# Patient Record
Sex: Male | Born: 1935 | Race: White | Hispanic: No | Marital: Married | Smoking: Never smoker
Health system: Southern US, Community
[De-identification: ages and names within clinical notes are randomized; demographics above are authoritative.]

## PROBLEM LIST (undated history)

## (undated) DIAGNOSIS — N320 Bladder-neck obstruction: Secondary | ICD-10-CM

## (undated) DIAGNOSIS — E785 Hyperlipidemia, unspecified: Secondary | ICD-10-CM

## (undated) DIAGNOSIS — M1990 Unspecified osteoarthritis, unspecified site: Secondary | ICD-10-CM

## (undated) DIAGNOSIS — I10 Essential (primary) hypertension: Secondary | ICD-10-CM

## (undated) DIAGNOSIS — J45909 Unspecified asthma, uncomplicated: Secondary | ICD-10-CM

## (undated) DIAGNOSIS — K635 Polyp of colon: Secondary | ICD-10-CM

## (undated) DIAGNOSIS — R7303 Prediabetes: Secondary | ICD-10-CM

## (undated) DIAGNOSIS — B269 Mumps without complication: Secondary | ICD-10-CM

## (undated) DIAGNOSIS — B059 Measles without complication: Secondary | ICD-10-CM

## (undated) HISTORY — DX: Essential (primary) hypertension: I10

## (undated) HISTORY — DX: Polyp of colon: K63.5

## (undated) HISTORY — DX: Hyperlipidemia, unspecified: E78.5

---
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
## 1970-09-07 HISTORY — PX: KNEE SURGERY: SHX244

## 2001-03-18 ENCOUNTER — Encounter: Payer: Self-pay | Admitting: Internal Medicine

## 2001-03-18 ENCOUNTER — Ambulatory Visit (HOSPITAL_COMMUNITY): Admission: RE | Admit: 2001-03-18 | Discharge: 2001-03-18 | Payer: Self-pay | Admitting: Internal Medicine

## 2001-06-17 ENCOUNTER — Encounter: Payer: Self-pay | Admitting: Emergency Medicine

## 2001-06-17 ENCOUNTER — Emergency Department (HOSPITAL_COMMUNITY): Admission: EM | Admit: 2001-06-17 | Discharge: 2001-06-17 | Payer: Self-pay | Admitting: Emergency Medicine

## 2006-10-20 ENCOUNTER — Emergency Department (HOSPITAL_COMMUNITY): Admission: EM | Admit: 2006-10-20 | Discharge: 2006-10-20 | Payer: Self-pay | Admitting: Emergency Medicine

## 2008-10-26 ENCOUNTER — Ambulatory Visit: Payer: Self-pay | Admitting: Gastroenterology

## 2008-11-05 LAB — HM COLONOSCOPY

## 2008-11-09 ENCOUNTER — Ambulatory Visit: Payer: Self-pay | Admitting: Gastroenterology

## 2011-11-09 ENCOUNTER — Ambulatory Visit (HOSPITAL_COMMUNITY)
Admission: RE | Admit: 2011-11-09 | Discharge: 2011-11-09 | Disposition: A | Discharge status: Home / Self Care | Payer: Medicare Other | Source: Ambulatory Visit | Attending: Orthopaedic Surgery | Admitting: Orthopaedic Surgery

## 2011-11-09 DIAGNOSIS — M7989 Other specified soft tissue disorders: Secondary | ICD-10-CM | POA: Insufficient documentation

## 2011-11-09 DIAGNOSIS — R52 Pain, unspecified: Secondary | ICD-10-CM

## 2011-11-09 DIAGNOSIS — R609 Edema, unspecified: Secondary | ICD-10-CM | POA: Insufficient documentation

## 2011-12-14 ENCOUNTER — Other Ambulatory Visit: Payer: Self-pay | Admitting: Internal Medicine

## 2011-12-14 DIAGNOSIS — I714 Abdominal aortic aneurysm, without rupture: Secondary | ICD-10-CM

## 2011-12-22 ENCOUNTER — Ambulatory Visit
Admission: RE | Admit: 2011-12-22 | Discharge: 2011-12-22 | Disposition: A | Discharge status: Home / Self Care | Payer: Medicare Other | Source: Ambulatory Visit | Attending: Internal Medicine | Admitting: Internal Medicine

## 2011-12-22 DIAGNOSIS — I714 Abdominal aortic aneurysm, without rupture: Secondary | ICD-10-CM

## 2012-01-14 HISTORY — PX: OTHER SURGICAL HISTORY: SHX169

## 2013-08-02 ENCOUNTER — Encounter: Payer: Self-pay | Admitting: Internal Medicine

## 2013-08-02 ENCOUNTER — Ambulatory Visit: Payer: Medicare Other | Admitting: Internal Medicine

## 2013-08-02 VITALS — BP 110/70 | HR 86 | Temp 98.6°F | Resp 16 | Wt 184.0 lb

## 2013-08-02 DIAGNOSIS — N320 Bladder-neck obstruction: Secondary | ICD-10-CM

## 2013-08-02 DIAGNOSIS — J029 Acute pharyngitis, unspecified: Secondary | ICD-10-CM

## 2013-08-02 DIAGNOSIS — J0410 Acute tracheitis without obstruction: Secondary | ICD-10-CM

## 2013-08-02 DIAGNOSIS — E559 Vitamin D deficiency, unspecified: Secondary | ICD-10-CM | POA: Insufficient documentation

## 2013-08-02 DIAGNOSIS — J019 Acute sinusitis, unspecified: Secondary | ICD-10-CM

## 2013-08-02 DIAGNOSIS — I10 Essential (primary) hypertension: Secondary | ICD-10-CM | POA: Insufficient documentation

## 2013-08-02 DIAGNOSIS — E782 Mixed hyperlipidemia: Secondary | ICD-10-CM

## 2013-08-02 HISTORY — DX: Bladder-neck obstruction: N32.0

## 2013-08-02 MED ORDER — HYDROCODONE-ACETAMINOPHEN 5-325 MG PO TABS: 1.0000 | ORAL_TABLET | Freq: Four times a day (QID) | ORAL | Status: DC | PRN

## 2013-08-02 MED ORDER — AZITHROMYCIN 250 MG PO TABS: ORAL_TABLET | ORAL | Status: AC

## 2013-08-02 MED ORDER — PREDNISONE 20 MG PO TABS: 20.0000 mg | ORAL_TABLET | ORAL | Status: DC

## 2013-08-02 NOTE — Patient Instructions (Signed)
Strep Throat Strep throat is an infection of the throat caused by a bacteria named Streptococcus pyogenes. Your caregiver may call the infection streptococcal "tonsillitis" or "pharyngitis" depending on whether there are signs of inflammation in the tonsils or back of the throat. Strep throat is most common in children aged 76 15 years during the cold months of the year, but it can occur in people of any age during any season. This infection is spread from person to person (contagious) through coughing, sneezing, or other close contact. SYMPTOMS   Fever or chills.  Painful, swollen, red tonsils or throat.  Pain or difficulty when swallowing.  White or yellow spots on the tonsils or throat.  Swollen, tender lymph nodes or "glands" of the neck or under the jaw.  Red rash all over the body (rare). DIAGNOSIS  Many different infections can cause the same symptoms. A test must be done to confirm the diagnosis so the right treatment can be given. A "rapid strep test" can help your caregiver make the diagnosis in a few minutes. If this test is not available, a light swab of the infected area can be used for a throat culture test. If a throat culture test is done, results are usually available in a day or two. TREATMENT  Strep throat is treated with antibiotic medicine. HOME CARE INSTRUCTIONS   Gargle with 1 tsp of salt in 1 cup of warm water, 3 4 times per day or as needed for comfort.  Family members who also have a sore throat or fever should be tested for strep throat and treated with antibiotics if they have the strep infection.  Make sure everyone in your household washes their hands well.  Do not share food, drinking cups, or personal items that could cause the infection to spread to others.  You may need to eat a soft food diet until your sore throat gets better.  Drink enough water and fluids to keep your urine clear or pale yellow. This will help prevent dehydration.  Get plenty of  rest.  Stay home from school, daycare, or work until you have been on antibiotics for 24 hours.  Only take over-the-counter or prescription medicines for pain, discomfort, or fever as directed by your caregiver.  If antibiotics are prescribed, take them as directed. Finish them even if you start to feel better. SEEK MEDICAL CARE IF:   The glands in your neck continue to enlarge.  You develop a rash, cough, or earache.  You cough up green, yellow-brown, or bloody sputum.  You have pain or discomfort not controlled by medicines.  Your problems seem to be getting worse rather than better. SEEK IMMEDIATE MEDICAL CARE IF:   You develop any new symptoms such as vomiting, severe headache, stiff or painful neck, chest pain, shortness of breath, or trouble swallowing.  You develop severe throat pain, drooling, or changes in your voice.  You develop swelling of the neck, or the skin on the neck becomes red and tender.  You have a fever.  You develop signs of dehydration, such as fatigue, dry mouth, and decreased urination.  You become increasingly sleepy, or you cannot wake up completely. Document Released: 08/21/2000 Document Revised: 08/10/2012 Document Reviewed: 10/23/2010 [facility] Patient Information 2014 [place], [place].   Sinusitis Sinusitis is redness, soreness, and swelling (inflammation) of the paranasal sinuses. Paranasal sinuses are air pockets within the bones of your face (beneath the eyes, the middle of the forehead, or above the eyes). In healthy paranasal sinuses,  mucus is able to drain out, and air is able to circulate through them by way of your nose. However, when your paranasal sinuses are inflamed, mucus and air can become trapped. This can allow bacteria and other germs to grow and cause infection. Sinusitis can develop quickly and last only a short time (acute) or continue over a long period (chronic). Sinusitis that lasts for more than 12 weeks is considered  chronic.  CAUSES  Causes of sinusitis include:  Allergies.  Structural abnormalities, such as displacement of the cartilage that separates your nostrils (deviated septum), which can decrease the air flow through your nose and sinuses and affect sinus drainage.  Functional abnormalities, such as when the small hairs (cilia) that line your sinuses and help remove mucus do not work properly or are not present. SYMPTOMS  Symptoms of acute and chronic sinusitis are the same. The primary symptoms are pain and pressure around the affected sinuses. Other symptoms include:  Upper toothache.  Earache.  Headache.  Bad breath.  Decreased sense of smell and taste.  A cough, which worsens when you are lying flat.  Fatigue.  Fever.  Thick drainage from your nose, which often is green and may contain pus (purulent).  Swelling and warmth over the affected sinuses. DIAGNOSIS  Your caregiver will perform a physical exam. During the exam, your caregiver may:  Look in your nose for signs of abnormal growths in your nostrils (nasal polyps).  Tap over the affected sinus to check for signs of infection.  View the inside of your sinuses (endoscopy) with a special imaging device with a light attached (endoscope), which is inserted into your sinuses. If your caregiver suspects that you have chronic sinusitis, one or more of the following tests may be recommended:  Allergy tests.  Nasal culture A sample of mucus is taken from your nose and sent to a lab and screened for bacteria.  Nasal cytology A sample of mucus is taken from your nose and examined by your caregiver to determine if your sinusitis is related to an allergy. TREATMENT  Most cases of acute sinusitis are related to a viral infection and will resolve on their own within 10 days. Sometimes medicines are prescribed to help relieve symptoms (pain medicine, decongestants, nasal steroid sprays, or saline sprays).  However, for sinusitis  related to a bacterial infection, your caregiver will prescribe antibiotic medicines. These are medicines that will help kill the bacteria causing the infection.  Rarely, sinusitis is caused by a fungal infection. In theses cases, your caregiver will prescribe antifungal medicine. For some cases of chronic sinusitis, surgery is needed. Generally, these are cases in which sinusitis recurs more than 3 times per year, despite other treatments. HOME CARE INSTRUCTIONS   Drink plenty of water. Water helps thin the mucus so your sinuses can drain more easily.  Use a humidifier.  Inhale steam 3 to 4 times a day (for example, sit in the bathroom with the shower running).  Apply a warm, moist washcloth to your face 3 to 4 times a day, or as directed by your caregiver.  Use saline nasal sprays to help moisten and clean your sinuses.  Take over-the-counter or prescription medicines for pain, discomfort, or fever only as directed by your caregiver. SEEK IMMEDIATE MEDICAL CARE IF:  You have increasing pain or severe headaches.  You have nausea, vomiting, or drowsiness.  You have swelling around your face.  You have vision problems.  You have a stiff neck.  You have  difficulty breathing. MAKE SURE YOU:   Understand these instructions.  Will watch your condition.  Will get help right away if you are not doing well or get worse. Document Released: 08/24/2005 Document Revised: 11/16/2011 Document Reviewed: 09/08/2011 [facility] Patient Information 2014 [place], [place].   Bronchitis Bronchitis is the body's way of reacting to injury and/or infection (inflammation) of the bronchi. Bronchi are the air tubes that extend from the windpipe into the lungs. If the inflammation becomes severe, it may cause shortness of breath. CAUSES  Inflammation may be caused by:  A virus.  Germs (bacteria).  Dust.  Allergens.  Pollutants and many other irritants. The cells lining the bronchial tree are  covered with tiny hairs (cilia). These constantly beat upward, away from the lungs, toward the mouth. This keeps the lungs free of pollutants. When these cells become too irritated and are unable to do their job, mucus begins to develop. This causes the characteristic cough of bronchitis. The cough clears the lungs when the cilia are unable to do their job. Without either of these protective mechanisms, the mucus would settle in the lungs. Then you would develop pneumonia. Smoking is a common cause of bronchitis and can contribute to pneumonia. Stopping this habit is the single most important thing you can do to help yourself. TREATMENT   Your caregiver may prescribe an antibiotic if the cough is caused by bacteria. Also, medicines that open up your airways make it easier to breathe. Your caregiver may also recommend or prescribe an expectorant. It will loosen the mucus to be coughed up. Only take over-the-counter or prescription medicines for pain, discomfort, or fever as directed by your caregiver.  Removing whatever causes the problem (smoking, for example) is critical to preventing the problem from getting worse.  Cough suppressants may be prescribed for relief of cough symptoms.  Inhaled medicines may be prescribed to help with symptoms now and to help prevent problems from returning.  For those with recurrent (chronic) bronchitis, there may be a need for steroid medicines. SEEK IMMEDIATE MEDICAL CARE IF:   During treatment, you develop more pus-like mucus (purulent sputum).  You have a fever.  You become progressively more ill.  You have increased difficulty breathing, wheezing, or shortness of breath. It is necessary to seek immediate medical care if you are elderly or sick from any other disease. MAKE SURE YOU:   Understand these instructions.  Will watch your condition.  Will get help right away if you are not doing well or get worse. Document Released: 08/24/2005 Document  Revised: 04/26/2013 Document Reviewed: 04/18/2013 [facility] Patient Information 2014 [place], [place].

## 2013-08-02 NOTE — Progress Notes (Signed)
   Subjective:    Patient ID: Carlos Baldwin, male    DOB: 1935/06/25, 78 y.o.   MRN: [id number]  Sore Throat  The current episode started in the past 7 days. The problem has been waxing and waning. Neither side of throat is experiencing more pain than the other. There has been no fever. The pain is at a severity of 0/10. The pain is mild. Associated symptoms include congestion, coughing, headaches, a hoarse voice, shortness of breath, swollen glands and trouble swallowing. Pertinent negatives include no ear discharge, ear pain, neck pain, stridor or vomiting. Treatments tried: mucinex. The treatment provided no relief.  Cough Associated symptoms include headaches, postnasal drip, rhinorrhea, a sore throat and shortness of breath. Pertinent negatives include no chills, ear pain, fever or wheezing.      Review of Systems  Constitutional: Positive for fatigue. Negative for fever, chills and diaphoresis.  HENT: Positive for congestion, hoarse voice, postnasal drip, rhinorrhea, sinus pressure, sore throat and trouble swallowing. Negative for ear discharge, ear pain, facial swelling, hearing loss and sneezing.   Eyes: Negative.   Respiratory: Positive for cough, chest tightness and shortness of breath. Negative for choking, wheezing and stridor.   Cardiovascular: Negative.   Gastrointestinal: Negative.  Negative for vomiting.  Musculoskeletal: Negative for neck pain.  Neurological: Positive for headaches.       Objective:   Physical Exam  Constitutional: He is oriented to person, place, and time. He appears well-developed and well-nourished. No distress.  HENT:  Right Ear: External ear normal.  Left Ear: External ear normal.  Nose: Nose normal.  Mouth/Throat: Oropharyngeal exudate present.  Eyes: Pupils are equal, round, and reactive to light.  Neck: Neck supple. No thyromegaly present.  Cardiovascular: Normal rate, regular rhythm and normal heart sounds.   No murmur  heard. Pulmonary/Chest: No stridor. No respiratory distress. He has wheezes. He has rales. He exhibits no tenderness.  Musculoskeletal: Normal range of motion.  Lymphadenopathy:    He has cervical adenopathy.  Neurological: He is alert and oriented to person, place, and time.  Skin: Skin is warm and dry. No rash noted.  Psychiatric: He has a normal mood and affect.          Assessment & Plan:  1. Acute sinusitis, unspecified  - azithromycin (ZITHROMAX) 250 MG tablet; Take 2 tablets (500 mg) on  Day 1,  followed by 1 tablet (250 mg) once daily on Days 2 through 5.  Dispense: 6 each; Refill: 1 - predniSONE (DELTASONE) 20 MG tablet; Take 1 tablet (20 mg total) by mouth See admin instructions. 1 tab 3 x day for 3 days, then 1 tab 2 x day for 3 days, then 1 tab 1 x day for 5 days  Dispense: 20 tablet; Refill: 0  2. Acute pharyngitis  3. Acute tracheitis without mention of obstruction

## 2013-08-07 ENCOUNTER — Other Ambulatory Visit: Payer: Self-pay | Admitting: Internal Medicine

## 2013-12-18 ENCOUNTER — Other Ambulatory Visit: Payer: Self-pay | Admitting: Internal Medicine

## 2013-12-21 ENCOUNTER — Encounter: Payer: Self-pay | Admitting: Internal Medicine

## 2013-12-21 ENCOUNTER — Ambulatory Visit (INDEPENDENT_AMBULATORY_CARE_PROVIDER_SITE_OTHER): Payer: Medicare Other | Admitting: Internal Medicine

## 2013-12-21 VITALS — BP 126/78 | HR 88 | Temp 97.3°F | Resp 16 | Ht 69.75 in | Wt 186.2 lb

## 2013-12-21 DIAGNOSIS — Z789 Other specified health status: Secondary | ICD-10-CM

## 2013-12-21 DIAGNOSIS — R7303 Prediabetes: Secondary | ICD-10-CM | POA: Insufficient documentation

## 2013-12-21 DIAGNOSIS — I10 Essential (primary) hypertension: Secondary | ICD-10-CM

## 2013-12-21 DIAGNOSIS — Z1331 Encounter for screening for depression: Secondary | ICD-10-CM

## 2013-12-21 DIAGNOSIS — R7309 Other abnormal glucose: Secondary | ICD-10-CM

## 2013-12-21 DIAGNOSIS — Z0000 Encounter for general adult medical examination without abnormal findings: Secondary | ICD-10-CM

## 2013-12-21 DIAGNOSIS — Z79899 Other long term (current) drug therapy: Secondary | ICD-10-CM

## 2013-12-21 DIAGNOSIS — E559 Vitamin D deficiency, unspecified: Secondary | ICD-10-CM

## 2013-12-21 DIAGNOSIS — Z1212 Encounter for screening for malignant neoplasm of rectum: Secondary | ICD-10-CM

## 2013-12-21 DIAGNOSIS — E782 Mixed hyperlipidemia: Secondary | ICD-10-CM

## 2013-12-21 DIAGNOSIS — Z125 Encounter for screening for malignant neoplasm of prostate: Secondary | ICD-10-CM

## 2013-12-21 LAB — CBC WITH DIFFERENTIAL/PLATELET
Basophils Absolute: 0.1 10*3/uL (ref 0.0–0.1)
Basophils Relative: 1 % (ref 0–1)
Eosinophils Absolute: 0.2 10*3/uL (ref 0.0–0.7)
Eosinophils Relative: 4 % (ref 0–5)
HCT: 38.3 % — ABNORMAL LOW (ref 39.0–52.0)
Hemoglobin: 13.1 g/dL (ref 13.0–17.0)
LYMPHS ABS: 1.7 10*3/uL (ref 0.7–4.0)
LYMPHS PCT: 29 % (ref 12–46)
MCH: 29.9 pg (ref 26.0–34.0)
MCHC: 34.2 g/dL (ref 30.0–36.0)
MCV: 87.4 fL (ref 78.0–100.0)
Monocytes Absolute: 0.7 10*3/uL (ref 0.1–1.0)
Monocytes Relative: 12 % (ref 3–12)
NEUTROS ABS: 3.1 10*3/uL (ref 1.7–7.7)
NEUTROS PCT: 54 % (ref 43–77)
PLATELETS: 192 10*3/uL (ref 150–400)
RBC: 4.38 MIL/uL (ref 4.22–5.81)
RDW: 14.1 % (ref 11.5–15.5)
WBC: 5.7 10*3/uL (ref 4.0–10.5)

## 2013-12-21 LAB — BASIC METABOLIC PANEL WITH GFR
BUN: 27 mg/dL — ABNORMAL HIGH (ref 6–23)
CHLORIDE: 107 meq/L (ref 96–112)
CO2: 27 meq/L (ref 19–32)
Calcium: 8.7 mg/dL (ref 8.4–10.5)
Creat: 0.93 mg/dL (ref 0.50–1.35)
GFR, Est African American: >89 mL/min
GFR, Est Non African American: 79 mL/min
Glucose, Bld: 91 mg/dL (ref 70–99)
POTASSIUM: 4.3 meq/L (ref 3.5–5.3)
SODIUM: 140 meq/L (ref 135–145)

## 2013-12-21 LAB — LIPID PANEL
CHOL/HDL RATIO: 3.2 ratio
Cholesterol: 165 mg/dL (ref 0–200)
HDL: 52 mg/dL (ref >39)
LDL CALC: 99 mg/dL (ref 0–99)
Triglycerides: 68 mg/dL (ref <150)
VLDL: 14 mg/dL (ref 0–40)

## 2013-12-21 LAB — HEPATIC FUNCTION PANEL
ALK PHOS: 47 U/L (ref 39–117)
ALT: 15 U/L (ref 0–53)
AST: 15 U/L (ref 0–37)
Albumin: 3.9 g/dL (ref 3.5–5.2)
BILIRUBIN INDIRECT: 0.3 mg/dL (ref 0.2–1.2)
BILIRUBIN TOTAL: 0.4 mg/dL (ref 0.2–1.2)
Bilirubin, Direct: 0.1 mg/dL (ref 0.0–0.3)
Total Protein: 5.9 g/dL — ABNORMAL LOW (ref 6.0–8.3)

## 2013-12-21 LAB — MAGNESIUM: Magnesium: 1.8 mg/dL (ref 1.5–2.5)

## 2013-12-21 LAB — TSH: TSH: 2.116 u[IU]/mL (ref 0.350–4.500)

## 2013-12-21 MED ORDER — TADALAFIL 5 MG PO TABS: 5.0000 mg | ORAL_TABLET | Freq: Every day | ORAL | Status: DC | PRN

## 2013-12-21 NOTE — Progress Notes (Signed)
Patient ID: Carlos Baldwin, male   DOB: [birth year], 79 y.o.   MRN: [id number]   Annual Screening Comprehensive Examination  This very nice 79 y.o.  MWM presents for complete physical.  Patient has been followed for HTN, Diabetes  Prediabetes, Hyperlipidemia, and Vitamin D Deficiency.   HTN predates since 2003. Patient's BP has been controlled at home.Today's BP: 126/78 mmHg. Patient denies any cardiac symptoms as chest pain, palpitations, shortness of breath, dizziness or ankle swelling.   Patient's hyperlipidemia is controlled with diet. Patient denies myalgias or other medication SE's. Last cholesterol last visit was 160, triglycerides 70, HDL 52 and LDL 94 in Mar 2014 - all at goal.     Patient has prediabetes with A1c 6.1% in Mar 2011 andlast A1c 6.0% in Apr 2014. Patient denies reactive hypoglycemic symptoms, visual blurring, diabetic polys, or paresthesias.    Finally, patient has history of Vitamin D Deficiencyof 35 in 2008 with last vitamin D of 70 in Apr 2014  Medication Sig  . aspirin 81 MG tablet Take 81 mg by mouth daily.  Marland Kitchen VITAMIN D 1000 UNITS Take 6,000 Units by mouth daily.  . finasteride (PROSCAR) 5 MG tablet   . losartan (COZAAR) 100 MG tablet take 1 tablet by mouth once daily  . tamsulosin (FLOMAX) 0.4 MG CAPS  Take 1 tablet by mouth at bedtime for prostate   Allergies  Allergen Reactions  . Atenolol Other (See Comments)    ED  . Pravastatin   . Simvastatin   . Sulfonamide Derivatives    Past Medical History  Diagnosis Date  . Hyperlipidemia   . Hypertension     Past Surgical History  Procedure Laterality Date  . Knee surgery Right    History   Social History  . Marital Status: Married    Spouse Name: N/A    Number of Children: N/A  . Years of Education: N/A   Occupational History  . 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 Smoking status: Never Smoker   . Smokeless  tobacco: Not on file  . Alcohol Use: No  . Drug Use: No  . Sexual Activity: Actice    ROS Constitutional: Denies fever, chills, weight loss/gain, headaches, insomnia, fatigue, night sweats, and change in appetite. Eyes: Denies redness, blurred vision, diplopia, discharge, itchy, watery eyes.  ENT: Denies discharge, congestion, post nasal drip, epistaxis, sore throat, earache, hearing loss, dental pain, Tinnitus, Vertigo, Sinus pain, snoring.  Cardio: Denies chest pain, palpitations, irregular heartbeat, syncope, dyspnea, diaphoresis, orthopnea, PND, claudication, edema Respiratory: denies cough, dyspnea, DOE, pleurisy, hoarseness, laryngitis, wheezing.  Gastrointestinal: Denies dysphagia, heartburn, reflux, water brash, pain, cramps, nausea, vomiting, bloating, diarrhea, constipation, hematemesis, melena, hematochezia, jaundice, hemorrhoids Genitourinary: Denies dysuria, frequency, urgency, nocturia, hesitancy, discharge, hematuria, flank pain Musculoskeletal: Denies arthralgia, myalgia, stiffness, Jt. Swelling, pain, limp, and strain/sprain. Skin: Denies puritis, rash, hives, warts, acne, eczema, changing in skin lesion Neuro: No weakness, tremor, incoordination, spasms, paresthesia, pain Psychiatric: Denies confusion, memory loss, sensory loss Endocrine: Denies change in weight, skin, hair change, nocturia, and paresthesia, diabetic polys, visual blurring, hyper / hypo glycemic episodes.  Heme/Lymph: No excessive bleeding, bruising, or elarged lymph nodes.  Physical Exam  BP 126/78  Pulse 88  Temp97.3 F   Resp 16  Ht 5' 9.75"   Wt 186 lb 3.2 oz   BMI 26.90 kg/m2  General Appearance: Well nourished, in no apparent distress. Eyes: PERRLA, EOMs,  conjunctiva no swelling or erythema, normal fundi and vessels. Sinuses: No frontal/maxillary tenderness ENT/Mouth: EACs patent / TMs  nl. Nares clear without erythema, swelling, mucoid exudates. Oral hygiene is good. No erythema, swelling, or  exudate. Tongue normal, non-obstructing. Tonsils not swollen or erythematous. Hearing normal.  Neck: Supple, thyroid normal. No bruits, nodes or JVD. Respiratory: Respiratory effort normal.  BS equal and clear bilateral without rales, rhonci, wheezing or stridor. Cardio: Heart sounds are normal with regular rate and rhythm and no murmurs, rubs or gallops. Peripheral pulses are normal and equal bilaterally without edema. No aortic or femoral bruits. Chest: symmetric with normal excursions and percussion.  Abdomen: Flat, soft, with bowl sounds. Nontender, no guarding, rebound, hernias, masses, or organomegaly.  Lymphatics: Non tender without lymphadenopathy.  Genitourinary: No hernias.Testes nl. DRE - prostate nl for age - smooth & firm w/o nodules. Musculoskeletal: Full ROM all peripheral extremities, joint stability, 5/5 strength, and normal gait. Skin: Warm and dry without rashes, lesions, cyanosis, clubbing or  ecchymosis.  Neuro: Cranial nerves intact, reflexes equal bilaterally. Normal muscle tone, no cerebellar symptoms. Sensation intact.  Pysch: Awake and oriented X 3, normal affect, insight and judgment appropriate.   Assessment and Plan  1. Annual Screening Examination 2. Hypertension  3. Hyperlipidemia - diet 4. Pre Diabetes - diet 5. Vitamin D Deficiency - compensated  Continue prudent diet as discussed, weight control, BP monitoring, regular exercise, and medications as discussed.  Discussed med effects and SE's. Routine screening labs and tests as requested with regular follow-up as recommended.

## 2013-12-21 NOTE — Patient Instructions (Signed)

## 2013-12-22 LAB — HEMOGLOBIN A1C
Hgb A1c MFr Bld: 6.1 % — ABNORMAL HIGH (ref <5.7)
Mean Plasma Glucose: 128 mg/dL — ABNORMAL HIGH (ref <117)

## 2013-12-22 LAB — INSULIN, FASTING: Insulin fasting, serum: 12 u[IU]/mL (ref 3–28)

## 2013-12-22 LAB — PSA: PSA: 0.44 ng/mL (ref <=4.00)

## 2013-12-22 LAB — URINALYSIS, MICROSCOPIC ONLY
BACTERIA UA: NONE SEEN
CRYSTALS: NONE SEEN
Casts: NONE SEEN
SQUAMOUS EPITHELIAL / LPF: NONE SEEN

## 2013-12-22 LAB — MICROALBUMIN / CREATININE URINE RATIO
Creatinine, Urine: 77.7 mg/dL
Microalb Creat Ratio: 7.5 mg/g (ref 0.0–30.0)
Microalb, Ur: 0.58 mg/dL (ref 0.00–1.89)

## 2013-12-22 LAB — VITAMIN D 25 HYDROXY (VIT D DEFICIENCY, FRACTURES): Vit D, 25-Hydroxy: 73 ng/mL (ref 30–89)

## 2014-02-08 ENCOUNTER — Other Ambulatory Visit: Payer: Self-pay | Admitting: Physician Assistant

## 2014-03-09 ENCOUNTER — Other Ambulatory Visit: Payer: Self-pay | Admitting: Internal Medicine

## 2014-04-09 ENCOUNTER — Encounter: Payer: Self-pay | Admitting: Physician Assistant

## 2014-04-09 ENCOUNTER — Ambulatory Visit (INDEPENDENT_AMBULATORY_CARE_PROVIDER_SITE_OTHER): Payer: Medicare Other | Admitting: Physician Assistant

## 2014-04-09 VITALS — BP 120/62 | HR 76 | Temp 97.7°F | Resp 16 | Ht 70.0 in | Wt 179.0 lb

## 2014-04-09 DIAGNOSIS — J209 Acute bronchitis, unspecified: Secondary | ICD-10-CM

## 2014-04-09 MED ORDER — AZITHROMYCIN 250 MG PO TABS: ORAL_TABLET | ORAL | Status: DC

## 2014-04-09 MED ORDER — PREDNISONE 20 MG PO TABS: ORAL_TABLET | ORAL | Status: DC

## 2014-04-09 MED ORDER — BENZONATATE 100 MG PO CAPS: 100.0000 mg | ORAL_CAPSULE | Freq: Four times a day (QID) | ORAL | Status: DC | PRN

## 2014-04-09 NOTE — Progress Notes (Signed)
   Subjective:    Patient ID: Carlos Baldwin, male    DOB: 1935/08/06, 79 y.o.   MRN: [id number]  Cough This is a new problem. Episode onset: 1 week. The problem has been gradually worsening. The cough is non-productive. Associated symptoms include chills, nasal congestion, postnasal drip and wheezing. Pertinent negatives include no chest pain, ear congestion, ear pain, fever, headaches, heartburn, hemoptysis, myalgias, rash, rhinorrhea, sore throat, shortness of breath, sweats or weight loss. He has tried OTC cough suppressant, leukotriene antagonists and rest for the symptoms. The treatment provided no relief.      Review of Systems  Constitutional: Positive for chills. Negative for fever, weight loss and diaphoresis.  HENT: Positive for congestion, postnasal drip and sinus pressure. Negative for ear pain, rhinorrhea, sneezing, sore throat, trouble swallowing and voice change.   Eyes: Negative.   Respiratory: Positive for cough and wheezing. Negative for hemoptysis, chest tightness and shortness of breath.   Cardiovascular: Negative.  Negative for chest pain.  Gastrointestinal: Negative.  Negative for heartburn.  Genitourinary: Negative.   Musculoskeletal: Negative.  Negative for myalgias and neck pain.  Skin: Negative for rash.  Neurological: Negative.  Negative for headaches.       Objective:   Physical Exam  Constitutional: He is oriented to person, place, and time. He appears well-developed and well-nourished.  HENT:  Head: Normocephalic and atraumatic.  Right Ear: External ear normal.  Left Ear: External ear normal.  Nose: Nose normal.  Mouth/Throat: Oropharynx is clear and moist.  Eyes: Conjunctivae are normal. Pupils are equal, round, and reactive to light.  Neck: Normal range of motion. Neck supple.  Cardiovascular: Normal rate, regular rhythm and normal heart sounds.   No murmur heard. Pulmonary/Chest: Effort normal. No respiratory distress. He has wheezes. He has  no rales. He exhibits no tenderness.  Abdominal: Soft. Bowel sounds are normal.  Lymphadenopathy:    He has no cervical adenopathy.  Neurological: He is alert and oriented to person, place, and time.  Skin: Skin is warm and dry.       Assessment & Plan:  1. Acute bronchitis, unspecified organism [466.0] Follow up if not better, or go to the ER if worsening SOB/CP Gave nettie pot sample and instructions - predniSONE (DELTASONE) 20 MG tablet; Take one pill two times daily for 3 days, take one pill daily for 4 days.  Dispense: 10 tablet; Refill: 0 - azithromycin (ZITHROMAX) 250 MG tablet; 2 tablets by mouth today then one tablet daily for 4 days.  Dispense: 6 tablet; Refill: 1 - benzonatate (TESSALON PERLES) 100 MG capsule; Take 1 capsule (100 mg total) by mouth every 6 (six) hours as needed for cough.  Dispense: 60 capsule; Refill: 1

## 2014-04-09 NOTE — Patient Instructions (Signed)
Acute Bronchitis Bronchitis is when the airways that extend from the windpipe into the lungs get red, puffy, and painful (inflamed). Bronchitis often causes thick spit (mucus) to develop. This leads to a cough. A cough is the most common symptom of bronchitis. In acute bronchitis, the condition usually begins suddenly and goes away over time (usually in 2 weeks). Smoking, allergies, and asthma can make bronchitis worse. Repeated episodes of bronchitis may cause more lung problems. HOME CARE  Rest.  Drink enough fluids to keep your pee (urine) clear or pale yellow (unless you need to limit fluids as told by your doctor).  Only take over-the-counter or prescription medicines as told by your doctor.  Avoid smoking and secondhand smoke. These can make bronchitis worse. If you are a smoker, think about using nicotine gum or skin patches. Quitting smoking will help your lungs heal faster.  Reduce the chance of getting bronchitis again by:  Washing your hands often.  Avoiding people with cold symptoms.  Trying not to touch your hands to your mouth, nose, or eyes.  Follow up with your doctor as told. GET HELP IF: Your symptoms do not improve after 1 week of treatment. Symptoms include:  Cough.  Fever.  Coughing up thick spit.  Body aches.  Chest congestion.  Chills.  Shortness of breath.  Sore throat. GET HELP RIGHT AWAY IF:   You have an increased fever.  You have chills.  You have severe shortness of breath.  You have bloody thick spit (sputum).  You throw up (vomit) often.  You lose too much body fluid (dehydration).  You have a severe headache.  You faint. MAKE SURE YOU:   Understand these instructions.  Will watch your condition.  Will get help right away if you are not doing well or get worse. Document Released: 02/10/2008 Document Revised: 04/26/2013 Document Reviewed: 02/14/2013 [employer] Patient Information 2015 [employer]. This information is not  intended to replace advice given to you by your health care provider. Make sure you discuss any questions you have with your health care provider.  

## 2014-06-27 ENCOUNTER — Ambulatory Visit: Payer: Self-pay | Admitting: Internal Medicine

## 2014-06-27 ENCOUNTER — Ambulatory Visit (INDEPENDENT_AMBULATORY_CARE_PROVIDER_SITE_OTHER): Payer: Medicare Other | Admitting: Physician Assistant

## 2014-06-27 VITALS — BP 130/70 | HR 84 | Temp 97.9°F | Resp 16 | Ht 70.0 in | Wt 182.0 lb

## 2014-06-27 DIAGNOSIS — E559 Vitamin D deficiency, unspecified: Secondary | ICD-10-CM

## 2014-06-27 DIAGNOSIS — Z79899 Other long term (current) drug therapy: Secondary | ICD-10-CM

## 2014-06-27 DIAGNOSIS — Z23 Encounter for immunization: Secondary | ICD-10-CM

## 2014-06-27 DIAGNOSIS — Z0001 Encounter for general adult medical examination with abnormal findings: Secondary | ICD-10-CM

## 2014-06-27 DIAGNOSIS — Z1331 Encounter for screening for depression: Secondary | ICD-10-CM

## 2014-06-27 DIAGNOSIS — R7303 Prediabetes: Secondary | ICD-10-CM

## 2014-06-27 DIAGNOSIS — R6889 Other general symptoms and signs: Secondary | ICD-10-CM

## 2014-06-27 DIAGNOSIS — Z9181 History of falling: Secondary | ICD-10-CM

## 2014-06-27 DIAGNOSIS — I10 Essential (primary) hypertension: Secondary | ICD-10-CM

## 2014-06-27 DIAGNOSIS — E782 Mixed hyperlipidemia: Secondary | ICD-10-CM

## 2014-06-27 LAB — HEPATIC FUNCTION PANEL
ALBUMIN: 4 g/dL (ref 3.5–5.2)
ALT: 17 U/L (ref 0–53)
AST: 16 U/L (ref 0–37)
Alkaline Phosphatase: 54 U/L (ref 39–117)
BILIRUBIN DIRECT: 0.1 mg/dL (ref 0.0–0.3)
BILIRUBIN TOTAL: 0.4 mg/dL (ref 0.2–1.2)
Indirect Bilirubin: 0.3 mg/dL (ref 0.2–1.2)
Total Protein: 6.1 g/dL (ref 6.0–8.3)

## 2014-06-27 LAB — CBC WITH DIFFERENTIAL/PLATELET
BASOS PCT: 0 % (ref 0–1)
Basophils Absolute: 0 10*3/uL (ref 0.0–0.1)
EOS ABS: 0.1 10*3/uL (ref 0.0–0.7)
Eosinophils Relative: 1 % (ref 0–5)
HEMATOCRIT: 41.4 % (ref 39.0–52.0)
HEMOGLOBIN: 13.8 g/dL (ref 13.0–17.0)
LYMPHS ABS: 0.8 10*3/uL (ref 0.7–4.0)
Lymphocytes Relative: 11 % — ABNORMAL LOW (ref 12–46)
MCH: 30.3 pg (ref 26.0–34.0)
MCHC: 33.3 g/dL (ref 30.0–36.0)
MCV: 90.8 fL (ref 78.0–100.0)
MONO ABS: 0.4 10*3/uL (ref 0.1–1.0)
MONOS PCT: 5 % (ref 3–12)
NEUTROS ABS: 6 10*3/uL (ref 1.7–7.7)
Neutrophils Relative %: 83 % — ABNORMAL HIGH (ref 43–77)
Platelets: 181 10*3/uL (ref 150–400)
RBC: 4.56 MIL/uL (ref 4.22–5.81)
RDW: 13.9 % (ref 11.5–15.5)
WBC: 7.2 10*3/uL (ref 4.0–10.5)

## 2014-06-27 LAB — BASIC METABOLIC PANEL WITH GFR
BUN: 26 mg/dL — AB (ref 6–23)
CO2: 24 meq/L (ref 19–32)
Calcium: 9 mg/dL (ref 8.4–10.5)
Chloride: 107 mEq/L (ref 96–112)
Creat: 0.97 mg/dL (ref 0.50–1.35)
GFR, EST AFRICAN AMERICAN: 87 mL/min
GFR, Est Non African American: 76 mL/min
GLUCOSE: 113 mg/dL — AB (ref 70–99)
POTASSIUM: 4.3 meq/L (ref 3.5–5.3)
SODIUM: 137 meq/L (ref 135–145)

## 2014-06-27 LAB — LIPID PANEL
CHOL/HDL RATIO: 2.6 ratio
Cholesterol: 140 mg/dL (ref 0–200)
HDL: 53 mg/dL (ref >39)
LDL Cholesterol: 72 mg/dL (ref 0–99)
Triglycerides: 74 mg/dL (ref <150)
VLDL: 15 mg/dL (ref 0–40)

## 2014-06-27 LAB — TSH: TSH: 1.137 u[IU]/mL (ref 0.350–4.500)

## 2014-06-27 LAB — MAGNESIUM: Magnesium: 1.9 mg/dL (ref 1.5–2.5)

## 2014-06-27 MED ORDER — MELOXICAM 15 MG PO TABS: 15.0000 mg | ORAL_TABLET | Freq: Every day | ORAL | Status: DC

## 2014-06-27 NOTE — Progress Notes (Signed)
MEDICARE ANNUAL WELLNESS VISIT AND FOLLOW UP Assessment:   1. Essential hypertension - CBC with Differential - BASIC METABOLIC PANEL WITH GFR - Hepatic function panel - TSH  2. Hyperlipidemia - Lipid panel  3. Vitamin D deficiency - Vit D  25 hydroxy (rtn osteoporosis monitoring)  4. Prediabetes Discussed general issues about diabetes pathophysiology and management., Educational material distributed., Suggested low cholesterol diet., Encouraged aerobic exercise., Discussed foot care., Reminded to get yearly retinal exam. - Hemoglobin A1c - Insulin, fasting - HM DIABETES FOOT EXAM  5. Encounter for long-term (current) use of medications - Magnesium  6. Left knee pain mobic 15mg   7. Screening for depression negative  8. At low risk for fall Low risk falls  9. Need for prevnar 13  Plan:   During the course of the visit the patient was educated and counseled about appropriate screening and preventive services including:    Pneumococcal vaccine   Influenza vaccine  Td vaccine  Screening electrocardiogram  Colorectal cancer screening  Diabetes screening  Glaucoma screening  Nutrition counseling   Screening recommendations, referrals: Vaccinations: Please see documentation below and orders this visit.  Nutrition assessed and recommended  Colonoscopy due 2020 Recommended yearly ophthalmology/optometry visit for glaucoma screening and checkup Recommended yearly dental visit for hygiene and checkup Advanced directives - requested  Conditions/risks identified: BMI: Discussed weight loss, diet, and increase physical activity.  Increase physical activity: AHA recommends 150 minutes of physical activity a week.  Medications reviewed Diabetes is at goal, ACE/ARB therapy: Yes. Urinary Incontinence is not an issue: discussed non pharmacology and pharmacology options.  Fall risk: low- discussed PT, home fall assessment, medications.    Subjective:  Cristobal Advani is a 79 y.o. male who presents for Medicare Annual Wellness Visit and 3 month follow up for HTN, hyperlipidemia, prediabetes, and vitamin D Def.  Date of last medicare wellness visit was is unknown.  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  He denies chest pain, shortness of breath, dizziness.  He is not on cholesterol medication and denies myalgias. His cholesterol is at goal. The cholesterol last visit was:   Lab Results  Component Value Date   CHOL 165 12/21/2013   HDL 52 12/21/2013   LDLCALC 99 12/21/2013   TRIG 68 12/21/2013   CHOLHDL 3.2 12/21/2013   He has been working on diet and exercise for prediabetes, and denies paresthesia of the feet, polydipsia and polyuria. Last A1C in the office was:  Lab Results  Component Value Date   HGBA1C 6.1* 12/21/2013   Patient is on Vitamin D supplement.   Lab Results  Component Value Date   VD25OH 73 12/21/2013      Names of Other Physician/Practitioners you currently use: 1. [facility] here for primary care Patient Care Team: Unk Pinto, MD as PCP - General (Internal Medicine) Garald Balding, MD as Consulting Physician (Orthopedic Surgery) Ladene Artist, MD as Consulting Physician (Gastroenterology) Posey Pronto, DDS as Consulting Physician (Dentistry) Roselie Awkward, MD as Consulting Physician (Ophthalmology)- last week  Medication Review: Current Outpatient Prescriptions on File Prior to Visit  Medication Sig Dispense Refill  . cholecalciferol (VITAMIN D) 1000 UNITS tablet Take 6,000 Units by mouth daily.      . finasteride (PROSCAR) 5 MG tablet Take one tablet by mouth  one time daily to shrink prostate.  90 tablet  3  . losartan (COZAAR) 100 MG tablet take 1 tablet by mouth once daily  30 tablet  2  . tamsulosin (FLOMAX) 0.4 MG CAPS  capsule Take 1 tablet by mouth at bedtime for prostate  30 capsule  6   No current facility-administered medications on file prior to visit.    Current Problems (verified) Patient Active Problem List   Diagnosis Date Noted  . Prediabetes 12/21/2013  . Encounter for long-term (current) use of medications 12/21/2013  . Essential hypertension 08/02/2013  . Hyperlipidemia 08/02/2013  . Vitamin D deficiency 08/02/2013  . Bladder neck obstruction 08/02/2013    Screening Tests Health Maintenance  Topic Date Due  . Influenza Vaccine  04/08/2015  . Colonoscopy  11/06/2018  . Tetanus/tdap  02/21/2019  . Pneumococcal Polysaccharide Vaccine Age 52 And Over  Completed  . Zostavax  Completed    Immunization History  Administered Date(s) Administered  . Pneumococcal Polysaccharide-23 09/07/2006  . Zoster 09/07/2008   Preventative care: Last colonoscopy: 2010  Prior vaccinations: TD or Tdap: 2010  Influenza: 2015  Pneumococcal: 2008 Prevnar: DUE Shingles/Zostavax: 2010  History reviewed: allergies, current medications, past family history, past medical history, past social history, past surgical history and problem list   Risk Factors: Tobacco History  Substance Use Topics  . Smoking status: Never Smoker   . Smokeless tobacco: Not on file  . Alcohol Use: No   He does not smoke.  Patient is not a former smoker. Are there smokers in your home (other than you)?  No  Alcohol Current alcohol use: none  Caffeine Current caffeine use: coffee 1 /day  Exercise Current exercise: bicycling and weightlifting  Nutrition/Diet Current diet: in general, a "healthy" diet    Cardiac risk factors: advanced age (older than 41 for men, 42 for women), dyslipidemia, hypertension and male gender.  Depression Screen (Note: if answer to either of the following is "Yes", a more complete depression screening is indicated)   Q1: Over the past two weeks, have you felt down, depressed or  hopeless? No  Q2: Over the past two weeks, have you felt little interest or pleasure in doing things? No  Have you lost interest or pleasure in daily life? No  Do you often feel hopeless? No  Do you cry easily over simple problems? No  Activities of Daily Living In your present state of health, do you have any difficulty performing the following activities?:  Driving? No Managing money?  No Feeding yourself? No Getting from bed to chair? No Climbing a flight of stairs? No Preparing food and eating?: No Bathing or showering? No Getting dressed: No Getting to the toilet? No Using the toilet:No Moving around from place to place: No In the past year have you fallen or had a near fall?:No   Are you sexually active?  Yes  Do you have more than one partner?  No  Vision Difficulties: No  Hearing Difficulties: No Do you often ask people to speak up or repeat themselves? No Do you experience ringing or noises in your ears? No Do you have difficulty understanding soft or whispered voices? No  Cognition  Do you feel that you have a problem with memory?No  Do you often misplace items? No  Do you feel safe at home?  Yes  Advanced directives Does patient have a [place]? Yes Does patient have a Living Will? Yes   Objective:   Blood  pressure 130/70, pulse 84, temperature 97.9 F (36.6 C), resp. rate 16, height 5\' 10"  (1.778 m), weight 182 lb (82.555 kg). Body mass index is 26.11 kg/(m^2).  General appearance: alert, no distress, WD/WN, male Cognitive Testing  Alert? Yes  Normal Appearance?Yes  Oriented to person? Yes  Place? Yes   Time? Yes  Recall of three objects?  Yes  Can perform simple calculations? Yes  Displays appropriate judgment?Yes  Can read the correct time from a watch face?Yes  HEENT: normocephalic, sclerae anicteric, TMs pearly, nares patent, no discharge or erythema, pharynx normal Oral cavity: MMM, no lesions Neck: supple, no  lymphadenopathy, no thyromegaly, no masses Heart: RRR, normal S1, S2, no murmurs Lungs: CTA bilaterally, no wheezes, rhonchi, or rales Abdomen: +bs, soft, non tender, non distended, no masses, no hepatomegaly, no splenomegaly Musculoskeletal: nontender, no swelling, no obvious deformity Extremities: no edema, no cyanosis, no clubbing Pulses: 2+ symmetric, upper and lower extremities, normal cap refill Neurological: alert, oriented x 3, CN2-12 intact, strength normal upper extremities and lower extremities, sensation normal throughout, DTRs 2+ throughout, no cerebellar signs, gait normal Psychiatric: normal affect, behavior normal, pleasant   Medicare Attestation I have personally reviewed: The patient's medical and social history Their use of alcohol, tobacco or illicit drugs Their current medications and supplements The patient's functional ability including ADLs,fall risks, home safety risks, cognitive, and hearing and visual impairment Diet and physical activities Evidence for depression or mood disorders  The patient's weight, height, BMI, and visual acuity have been recorded in the chart.  I have made referrals, counseling, and provided education to the patient based on review of the above and I have provided the patient with a written personalized care plan for preventive services.     Vicie Mutters, PA-C   06/27/2014

## 2014-06-27 NOTE — Patient Instructions (Signed)
Preventative Care for Adults, Male       REGULAR HEALTH EXAMS:  A routine yearly physical is a good way to check in with your primary care provider about your health and preventive screening. It is also an opportunity to share updates about your health and any concerns you have, and receive a thorough all-over exam.   Most health insurance companies pay for at least some preventative services.  Check with your health plan for specific coverages.  WHAT PREVENTATIVE SERVICES DO MEN NEED?  Adult men should have their weight and blood pressure checked regularly.   Men age 35 and older should have their cholesterol levels checked regularly.  Beginning at age 50 and continuing to age 75, men should be screened for colorectal cancer.  Certain people should may need continued testing until age 85.  Other cancer screening may include exams for testicular and prostate cancer.  Updating vaccinations is part of preventative care.  Vaccinations help protect against diseases such as the flu.  Lab tests are generally done as part of preventative care to screen for anemia and blood disorders, to screen for problems with the kidneys and liver, to screen for bladder problems, to check blood sugar, and to check your cholesterol level.  Preventative services generally include counseling about diet, exercise, avoiding tobacco, drugs, excessive alcohol consumption, and sexually transmitted infections.    GENERAL RECOMMENDATIONS FOR GOOD HEALTH:  Healthy diet:  Eat a variety of foods, including fruit, vegetables, animal or vegetable protein, such as meat, fish, chicken, and eggs, or beans, lentils, tofu, and grains, such as rice.  Drink plenty of water daily.  Decrease saturated fat in the diet, avoid lots of red meat, processed foods, sweets, fast foods, and fried foods.  Exercise:  Aerobic exercise helps maintain good heart health. At least 30-40 minutes of moderate-intensity exercise is recommended.  For example, a brisk walk that increases your heart rate and breathing. This should be done on most days of the week.   Find a type of exercise or a variety of exercises that you enjoy so that it becomes a part of your daily life.  Examples are running, walking, swimming, water aerobics, and biking.  For motivation and support, explore group exercise such as aerobic class, spin class, Zumba, Yoga,or  martial arts, etc.    Set exercise goals for yourself, such as a certain weight goal, walk or run in a race such as a 5k walk/run.  Speak to your primary care provider about exercise goals.  Disease prevention:  If you smoke or chew tobacco, find out from your caregiver how to quit. It can literally save your life, no matter how long you have been a tobacco user. If you do not use tobacco, never begin.   Maintain a healthy diet and normal weight. Increased weight leads to problems with blood pressure and diabetes.   The Body Mass Index or BMI is a way of measuring how much of your body is fat. Having a BMI above 27 increases the risk of heart disease, diabetes, hypertension, stroke and other problems related to obesity. Your caregiver can help determine your BMI and based on it develop an exercise and dietary program to help you achieve or maintain this important measurement at a healthful level.  High blood pressure causes heart and blood vessel problems.  Persistent high blood pressure should be treated with medicine if weight loss and exercise do not work.   Fat and cholesterol leaves deposits in your arteries   that can block them. This causes heart disease and vessel disease elsewhere in your body.  If your cholesterol is found to be high, or if you have heart disease or certain other medical conditions, then you may need to have your cholesterol monitored frequently and be treated with medication.   Ask if you should have a stress test if your history suggests this. A stress test is a test done on  a treadmill that looks for heart disease. This test can find disease prior to there being a problem.  Avoid drinking alcohol in excess (more than two drinks per day).  Avoid use of street drugs. Do not share needles with anyone. Ask for professional help if you need assistance or instructions on stopping the use of alcohol, cigarettes, and/or drugs.  Brush your teeth twice a day with fluoride toothpaste, and floss once a day. Good oral hygiene prevents tooth decay and gum disease. The problems can be painful, unattractive, and can cause other health problems. Visit your dentist for a routine oral and dental check up and preventive care every 6-12 months.   Look at your skin regularly.  Use a mirror to look at your back. Notify your caregivers of changes in moles, especially if there are changes in shapes, colors, a size larger than a pencil eraser, an irregular border, or development of new moles.  Safety:  Use seatbelts 100% of the time, whether driving or as a passenger.  Use safety devices such as hearing protection if you work in environments with loud noise or significant background noise.  Use safety glasses when doing any work that could send debris in to the eyes.  Use a helmet if you ride a bike or motorcycle.  Use appropriate safety gear for contact sports.  Talk to your caregiver about gun safety.  Use sunscreen with a SPF (or skin protection factor) of 15 or greater.  Lighter skinned people are at a greater risk of skin cancer. Don't forget to also wear sunglasses in order to protect your eyes from too much damaging sunlight. Damaging sunlight can accelerate cataract formation.   Practice safe sex. Use condoms. Condoms are used for birth control and to help reduce the spread of sexually transmitted infections (or STIs).  Some of the STIs are gonorrhea (the clap), chlamydia, syphilis, trichomonas, herpes, HPV (human papilloma virus) and HIV (human immunodeficiency virus) which causes AIDS.  The herpes, HIV and HPV are viral illnesses that have no cure. These can result in disability, cancer and death.   Keep carbon monoxide and smoke detectors in your home functioning at all times. Change the batteries every 6 months or use a model that plugs into the wall.   Vaccinations:  Stay up to date with your tetanus shots and other required immunizations. You should have a booster for tetanus every 10 years. Be sure to get your flu shot every year, since 5%-20% of the [place] population comes down with the flu. The flu vaccine changes each year, so being vaccinated once is not enough. Get your shot in the fall, before the flu season peaks.   Other vaccines to consider:  Pneumococcal vaccine to protect against certain types of pneumonia.  This is normally recommended for adults age 65 or older.  However, adults younger than 77 years old with certain underlying conditions such as diabetes, heart or lung disease should also receive the vaccine.  Shingles vaccine to protect against Varicella Zoster if you are older than age 60, or younger   than 77 years old with certain underlying illness.  Hepatitis A vaccine to protect against a form of infection of the liver by a virus acquired from food.  Hepatitis B vaccine to protect against a form of infection of the liver by a virus acquired from blood or body fluids, particularly if you work in health care.  If you plan to travel internationally, check with your local health department for specific vaccination recommendations.  Cancer Screening:  Most routine colon cancer screening begins at the age of 50. On a yearly basis, doctors may provide special easy to use take-home tests to check for hidden blood in the stool. Sigmoidoscopy or colonoscopy can detect the earliest forms of colon cancer and is life saving. These tests use a small camera at the end of a tube to directly examine the colon. Speak to your caregiver about this at age 50, when routine  screening begins (and is repeated every 5 years unless early forms of pre-cancerous polyps or small growths are found).   At the age of 50 men usually start screening for prostate cancer every year. Screening may begin at a younger age for those with higher risk. Those at higher risk include African-Americans or having a family history of prostate cancer. There are two types of tests for prostate cancer:   Prostate-specific antigen (PSA) testing. Recent studies raise questions about prostate cancer using PSA and you should discuss this with your caregiver.   Digital rectal exam (in which your doctor's lubricated and gloved finger feels for enlargement of the prostate through the anus).   Screening for testicular cancer.  Do a monthly exam of your testicles. Gently roll each testicle between your thumb and fingers, feeling for any abnormal lumps. The best time to do this is after a hot shower or bath when the tissues are looser. Notify your caregivers of any lumps, tenderness or changes in size or shape immediately.     

## 2014-06-28 LAB — VITAMIN D 25 HYDROXY (VIT D DEFICIENCY, FRACTURES): Vit D, 25-Hydroxy: 93 ng/mL — ABNORMAL HIGH (ref 30–89)

## 2014-06-28 LAB — INSULIN, FASTING: Insulin fasting, serum: 16.2 u[IU]/mL (ref 2.0–19.6)

## 2014-07-06 LAB — HEMOGLOBIN A1C

## 2014-08-13 ENCOUNTER — Other Ambulatory Visit: Payer: Self-pay | Admitting: *Deleted

## 2014-08-13 MED ORDER — LOSARTAN POTASSIUM 100 MG PO TABS: 100.0000 mg | ORAL_TABLET | Freq: Every day | ORAL | Status: DC

## 2014-08-17 ENCOUNTER — Other Ambulatory Visit: Payer: Self-pay | Admitting: *Deleted

## 2014-08-17 MED ORDER — TAMSULOSIN HCL 0.4 MG PO CAPS: ORAL_CAPSULE | ORAL | Status: DC

## 2014-11-29 ENCOUNTER — Ambulatory Visit (INDEPENDENT_AMBULATORY_CARE_PROVIDER_SITE_OTHER): Payer: Medicare Other | Admitting: Physician Assistant

## 2014-11-29 ENCOUNTER — Encounter: Payer: Self-pay | Admitting: Physician Assistant

## 2014-11-29 VITALS — BP 128/70 | HR 100 | Temp 97.7°F | Resp 16 | Ht 70.0 in | Wt 175.0 lb

## 2014-11-29 DIAGNOSIS — E782 Mixed hyperlipidemia: Secondary | ICD-10-CM

## 2014-11-29 DIAGNOSIS — R7303 Prediabetes: Secondary | ICD-10-CM

## 2014-11-29 DIAGNOSIS — R7309 Other abnormal glucose: Secondary | ICD-10-CM | POA: Diagnosis not present

## 2014-11-29 DIAGNOSIS — Z0001 Encounter for general adult medical examination with abnormal findings: Secondary | ICD-10-CM | POA: Diagnosis not present

## 2014-11-29 DIAGNOSIS — Z79899 Other long term (current) drug therapy: Secondary | ICD-10-CM

## 2014-11-29 DIAGNOSIS — R6889 Other general symptoms and signs: Secondary | ICD-10-CM

## 2014-11-29 DIAGNOSIS — E559 Vitamin D deficiency, unspecified: Secondary | ICD-10-CM

## 2014-11-29 DIAGNOSIS — Z1331 Encounter for screening for depression: Secondary | ICD-10-CM

## 2014-11-29 DIAGNOSIS — Z9181 History of falling: Secondary | ICD-10-CM

## 2014-11-29 DIAGNOSIS — N320 Bladder-neck obstruction: Secondary | ICD-10-CM

## 2014-11-29 DIAGNOSIS — I10 Essential (primary) hypertension: Secondary | ICD-10-CM

## 2014-11-29 MED ORDER — AZITHROMYCIN 250 MG PO TABS: ORAL_TABLET | ORAL | Status: AC

## 2014-11-29 MED ORDER — PREDNISONE 20 MG PO TABS: ORAL_TABLET | ORAL | Status: DC

## 2014-11-29 MED ORDER — ALBUTEROL SULFATE HFA 108 (90 BASE) MCG/ACT IN AERS: 1.0000 | INHALATION_SPRAY | Freq: Four times a day (QID) | RESPIRATORY_TRACT | Status: DC | PRN

## 2014-11-29 MED ORDER — PROMETHAZINE-CODEINE 6.25-10 MG/5ML PO SYRP: 5.0000 mL | ORAL_SOLUTION | Freq: Four times a day (QID) | ORAL | Status: DC | PRN

## 2014-11-29 NOTE — Patient Instructions (Signed)
You can take the prednisone, allergy pill, cough syrup and use the albuterol as needed, if you are not getting better, start with fever, chills, shortness of breath get son the zpak/azithromax/antbiotic  I will give you a prescription for an antibiotic, but please only take it if you are not feeling better in 7-10 days.  Bronchitis is mostly caused by viruses and the antibiotic will do nothing.  PLEASE TRY TO DO OVER THE COUNTER TREATMENT AND/OR PREDNISONE FOR 5-7 DAYS AND IF YOU ARE NOT GETTING BETTER OR GETTING WORSE THEN YOU CAN START ON AN ANTIBIOTIC GIVEN.  Can take the prednisone AT NIGHT WITH DINNER, it take 8-12 hours to start working so it will NOT affect your sleeping if you take it at night with your food!! Take two pills the first night and 1 or two pill the second night and then 1 pill the other nights.    Rest and stay hydrated.  Make sure you drink plenty of fluids to make sure urine is clear when you urinate.  Water will help thin out mucous. - Take Mucinex DM- Maximum Strength over the counter to thin out and cough up the thick mucous.  Please follow directions on box. -Take Albuterol if prescribed.  Risk of antibiotic use: About 1 in 4 people who take antibiotics have side effects including stomach problems, dizziness, or rashes. Those problems clear up soon after stopping the drugs, but in rare cases antibiotics can cause severe allergic reaction. Over use of antibiotics also encourages the growth of bacteria that can't be controlled easily with drugs. That makes you more vunerable to antibiotic-resistant infections and undermines the benefits of antibiotics for others.   Waste of Money: Antibiotics often aren't very expensive, but any money spent on unnecessary drugs is money down the drain.   When are antibiotics needed? Only when symptoms last longer than a week.  Start to improve but then worsen again  Please call the office or message through My Chart if you have any  questions.   Acute Bronchitis Bronchitis is when the airways that extend from the windpipe into the lungs get red, puffy, and painful (inflamed). Bronchitis often causes thick spit (mucus) to develop. This leads to a cough. A cough is the most common symptom of bronchitis. In acute bronchitis, the condition usually begins suddenly and goes away over time (usually in 2 weeks). Smoking, allergies, and asthma can make bronchitis worse. Repeated episodes of bronchitis may cause more lung problems.  Most common cause of Bronchitis is viruses (rhinovirus, coronavirus, RSV).  Therefore, not requiring an antibiotic; as antibiotics only treat bacterial infections.  HOME CARE  Rest.  Drink enough fluids to keep your pee (urine) clear or pale yellow (unless you need to limit fluids as told by your doctor).  Only take over-the-counter or prescription medicines as told by your doctor.  Avoid smoking and secondhand smoke. These can make bronchitis worse. If you are a smoker, think about using nicotine gum or skin patches. Quitting smoking will help your lungs heal faster.  Reduce the chance of getting bronchitis again by:  Washing your hands often.  Avoiding people with cold symptoms.  Trying not to touch your hands to your mouth, nose, or eyes.  Follow up with your doctor as told. GET HELP IF: Your symptoms do not improve after 1 week of treatment. Symptoms include:  Cough.  Fever.  Coughing up thick spit.  Body aches.  Chest congestion.  Chills.  Shortness of breath.  Sore throat. GET HELP RIGHT AWAY IF:   You have an increased fever.  You have chills.  You have severe shortness of breath.  You have bloody thick spit (sputum).  You throw up (vomit) often.  You lose too much body fluid (dehydration).  You have a severe headache.  You faint. MAKE SURE YOU:   Understand these instructions.  Will watch your condition.  Will get help right away if you are not doing  well or get worse. Document Released: 02/10/2008 Document Revised: 04/26/2013 Document Reviewed: 02/14/2013 [facility] Patient Information 2015 [place], [place]. This information is not intended to replace advice given to you by your health care provider. Make sure you discuss any questions you have with your health care provider.  Four common causes of cough:   Allergies, Viral Infections, Acid Reflux and Bacterial Infections. 1) Allergies and viral infections cause a cough by post nasal drip and are often worse at night, can also have sneezing, lower grade fevers, clear/yellow mucus. This is best treated with allergy medications or nasal sprays.  2) Bacterial infections are more severe than allergies or viral infections with fever, teeth pain, fatigue. This can be treated with prednisone and the same over the counter medication and after 7 days an antibiotic.  3) Silent reflux/GERD can cause a cough WITHOUT heart burn because the esophagus that goes to the stomach and trachea that goes to the lungs are very close and when you lay down the acid can irritate your throat and lungs. This can cause hoarseness, cough, and wheezing. Please stop any alcohol or anti-inflammatories like aleve/advil/ibuprofen and start an over the counter Prilosec or omeprazole 1-2 times daily 22mins before food for 2 weeks, then switch to over the counter zantac/ratinidine or pepcid/famotadine once at night for 2 weeks.   4) sometimes irritation causes more irritation. Try voice rest, use sugar free cough drops to prevent coughing, and try to stop clearing your throat.   If you ever have a cough that does not go away after trying these things please make a follow up visit for further evaluation or we can refer you to a specialist. Or if you ever have shortness of breath or chest pain go to the ER.

## 2014-11-29 NOTE — Progress Notes (Signed)
MEDICARE ANNUAL WELLNESS VISIT AND FOLLOW UP UHC Assessment:   1. Essential hypertension - CBC with Differential - BASIC METABOLIC PANEL WITH GFR - Hepatic function panel - TSH  2. Hyperlipidemia - Lipid panel  3. Vitamin D deficiency - Vit D  25 hydroxy (rtn osteoporosis monitoring)  4. Prediabetes Discussed general issues about diabetes pathophysiology and management., Educational material distributed., Suggested low cholesterol diet., Encouraged aerobic exercise., Discussed foot care., Reminded to get yearly retinal exam. - Hemoglobin A1c - Insulin, fasting - HM DIABETES FOOT EXAM  5. Encounter for long-term (current) use of medications - Magnesium  6. Bilateral knee pain Follow up with Dr. Durward Fortes, continue mobic  7. Screening for depression negative  8. At low risk for fall Low risk falls  9. Cough Likely from allergies/asthma/URI- no need for ABX at this time- get on prednisone, cough syrup, albuterol, and allergy pill. Sent in zpak but understands to hold onto it. No GERD symptoms but with daily GERD discussed if the cough is not getting better to stop the mobic and try OTC prilosec x 1-2 weeks, if not better will get CXR.   10. BPH/obstruction Continue medications    Plan:   During the course of the visit the patient was educated and counseled about appropriate screening and preventive services including:    Pneumococcal vaccine   Influenza vaccine  Td vaccine  Screening electrocardiogram  Colorectal cancer screening  Diabetes screening  Glaucoma screening  Nutrition counseling   Conditions/risks identified: BMI: Discussed weight loss, diet, and increase physical activity.  Increase physical activity: AHA recommends 150 minutes of physical activity a week.  Medications reviewed Diabetes is at goal, ACE/ARB therapy: Yes. Urinary Incontinence is not an issue: discussed non pharmacology and pharmacology options.  Fall risk: low- discussed PT,  home fall assessment, medications.    Subjective:  Carlos Baldwin is a 80 y.o. male who presents for Medicare Annual Wellness Visit and 3 month follow up for HTN, hyperlipidemia, prediabetes, and vitamin D Def.  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  He denies chest pain, shortness of breath, dizziness.  He is not on cholesterol medication and denies myalgias. His cholesterol is at goal. The cholesterol last visit was:   Lab Results  Component Value Date   CHOL 140 06/27/2014   HDL 53 06/27/2014   LDLCALC 72 06/27/2014   TRIG 74 06/27/2014   CHOLHDL 2.6 06/27/2014   He has been working on diet and exercise for prediabetes, and denies paresthesia of the feet, polydipsia and polyuria. Last A1C in the office was:  6.1 Patient is on Vitamin D supplement.   Lab Results  Component Value Date   VD25OH 48* 06/27/2014  He is on proscar and flomax for BMP which helps.    Has history of asthma but has not had an issues in years, no albuterol inhaler.  He has had a cough for 3 weeks, unchanged, some yellow mucus occ some post nasal drip, cough is worse at night, denies fever or chills, SOB, wheezing, CP, sinus pressure/pain,  tried tessalon pearles that did not help.   Medication Review: Current Outpatient Prescriptions on File Prior to Visit  Medication Sig Dispense Refill  . cholecalciferol (VITAMIN D) 1000 UNITS tablet Take 6,000 Units by mouth daily.    Marland Kitchen  finasteride (PROSCAR) 5 MG tablet Take one tablet by mouth one time daily to shrink prostate. 90 tablet 3  . losartan (COZAAR) 100 MG tablet Take 1 tablet (100 mg total) by mouth daily. 30 tablet 2  . meloxicam (MOBIC) 15 MG tablet Take 1 tablet (15 mg total) by mouth daily. 90 tablet 1  . tamsulosin (FLOMAX) 0.4 MG CAPS capsule Take 1 tablet  by mouth at bedtime for prostate 30 capsule 3   No current facility-administered medications on file prior to visit.   Review of Systems  Constitutional: Negative for fever, chills, weight loss, malaise/fatigue and diaphoresis.  HENT: Positive for congestion and sore throat (got better). Negative for ear discharge, ear pain, hearing loss, nosebleeds and tinnitus.   Eyes: Negative.   Respiratory: Positive for cough and sputum production. Negative for hemoptysis, shortness of breath, wheezing and stridor.   Cardiovascular: Negative.   Gastrointestinal: Negative.   Genitourinary: Negative.   Musculoskeletal: Positive for joint pain (bilateral knee pain). Negative for myalgias, back pain, falls and neck pain.  Skin: Negative.   Neurological: Negative.  Negative for weakness and headaches.  Psychiatric/Behavioral: Negative.     Current Problems (verified) Patient Active Problem List   Diagnosis Date Noted  . Prediabetes 12/21/2013  . Encounter for long-term (current) use of medications 12/21/2013  . Essential hypertension 08/02/2013  . Hyperlipidemia 08/02/2013  . Vitamin D deficiency 08/02/2013  . Bladder neck obstruction 08/02/2013    Screening Tests Immunization History  Administered Date(s) Administered  . Pneumococcal Conjugate-13 06/27/2014  . Pneumococcal Polysaccharide-23 09/07/2006  . Zoster 09/07/2008   Preventative care: Last colonoscopy: 2010 [place] AB 12/2011 CXR 2002  Prior vaccinations: TD or Tdap: 2010  Influenza: 2015  Pneumococcal: 2008 Prevnar: 2015 Shingles/Zostavax: 2010  Names of Other Physician/Practitioners you currently use: 1. [facility] here for primary care Patient Care Team: Unk Pinto, MD as PCP - General (Internal Medicine) Garald Balding, MD as Consulting Physician (Orthopedic Surgery) Ladene Artist, MD as Consulting Physician (Gastroenterology) Posey Pronto, DDS as Consulting Physician  (Dentistry) Roselie Awkward, MD as Consulting Physician (Ophthalmology)- 06/2014  Allergies Allergies  Allergen Reactions  . Atenolol Other (See Comments)    ED  . Pravastatin   . Simvastatin   . Sulfonamide Derivatives    Surgical history Past Surgical History  Procedure Laterality Date  . Knee surgery Right    Family history No family history on file.- mother/father passed in 90's no history, siblings healthy  Risk Factors: Tobacco History  Substance Use Topics  . Smoking status: Never Smoker   . Smokeless tobacco: Not on file  . Alcohol Use: No   MEDICARE WELLNESS OBJECTIVES: Tobacco use: He does not smoke.  Patient is not a former smoker. Alcohol Current alcohol use: none Caffeine Current caffeine use: coffee 1 /day Osteoporosis: dietary calcium and/or vitamin D deficiency, History of fracture in the past year: no Diet: in general, a "healthy" diet   Physical activity: walking and exercise class Depression/mood screen:  Yes - No Depression Hearing: normal Visual acuity: normal,  does perform annual eye exam  ADLs: self care Fall risk: Minimal risk Home safety: excellent Cognitive Testing  Alert? Yes  Normal Appearance?Yes  Oriented to person? Yes  Place? Yes   Time? Yes  Recall of three objects?  Yes  Can perform simple calculations? Yes  Displays appropriate judgment?Yes  Can read the correct time from a watch face?Yes EOL planning: Yes   Objective:   Blood  pressure 128/70, pulse 100, temperature 97.7 F (36.5 C), resp. rate 16, height 5\' 10"  (1.778 m), weight 175 lb (79.379 kg). Body mass index is 25.11 kg/(m^2).  General appearance: alert, no distress, WD/WN, male HEENT: normocephalic, sclerae anicteric, TMs pearly, nares patent, no discharge or erythema, pharynx normal Oral cavity: MMM, no lesions Neck: supple, no lymphadenopathy, no thyromegaly, no masses Heart: RRR, normal S1, S2, no murmurs Lungs: CTA bilaterally, no wheezes, rhonchi, or  rales Abdomen: +bs, soft, non tender, non distended, no masses, no hepatomegaly, no splenomegaly Musculoskeletal: nontender, no swelling, no obvious deformity Extremities: no edema, no cyanosis, no clubbing Pulses: 2+ symmetric, upper and lower extremities, normal cap refill Neurological: alert, oriented x 3, CN2-12 intact, strength normal upper extremities and lower extremities, sensation normal throughout, DTRs 2+ throughout, no cerebellar signs, gait antalgic due to knees Psychiatric: normal affect, behavior normal, pleasant   Medicare Attestation I have personally reviewed: The patient's medical and social history Their use of alcohol, tobacco or illicit drugs Their current medications and supplements The patient's functional ability including ADLs,fall risks, home safety risks, cognitive, and hearing and visual impairment Diet and physical activities Evidence for depression or mood disorders  The patient's weight, height, BMI, and visual acuity have been recorded in the chart.  I have made referrals, counseling, and provided education to the patient based on review of the above and I have provided the patient with a written personalized care plan for preventive services.     Vicie Mutters, PA-C   11/29/2014

## 2014-12-03 ENCOUNTER — Other Ambulatory Visit: Payer: Self-pay | Admitting: *Deleted

## 2014-12-03 MED ORDER — LOSARTAN POTASSIUM 100 MG PO TABS: 100.0000 mg | ORAL_TABLET | Freq: Every day | ORAL | Status: DC

## 2014-12-11 DIAGNOSIS — M1712 Unilateral primary osteoarthritis, left knee: Secondary | ICD-10-CM | POA: Diagnosis not present

## 2014-12-19 DIAGNOSIS — M1712 Unilateral primary osteoarthritis, left knee: Secondary | ICD-10-CM | POA: Diagnosis not present

## 2014-12-24 ENCOUNTER — Ambulatory Visit (INDEPENDENT_AMBULATORY_CARE_PROVIDER_SITE_OTHER): Payer: Medicare Other | Admitting: Internal Medicine

## 2014-12-24 ENCOUNTER — Encounter: Payer: Self-pay | Admitting: Internal Medicine

## 2014-12-24 VITALS — BP 120/66 | HR 80 | Temp 97.7°F | Resp 16 | Ht 69.0 in | Wt 170.8 lb

## 2014-12-24 DIAGNOSIS — I10 Essential (primary) hypertension: Secondary | ICD-10-CM | POA: Diagnosis not present

## 2014-12-24 DIAGNOSIS — R972 Elevated prostate specific antigen [PSA]: Secondary | ICD-10-CM | POA: Diagnosis not present

## 2014-12-24 DIAGNOSIS — E782 Mixed hyperlipidemia: Secondary | ICD-10-CM | POA: Diagnosis not present

## 2014-12-24 DIAGNOSIS — Z1212 Encounter for screening for malignant neoplasm of rectum: Secondary | ICD-10-CM

## 2014-12-24 DIAGNOSIS — Z9181 History of falling: Secondary | ICD-10-CM

## 2014-12-24 DIAGNOSIS — N320 Bladder-neck obstruction: Secondary | ICD-10-CM

## 2014-12-24 DIAGNOSIS — R7309 Other abnormal glucose: Secondary | ICD-10-CM | POA: Diagnosis not present

## 2014-12-24 DIAGNOSIS — Z79899 Other long term (current) drug therapy: Secondary | ICD-10-CM

## 2014-12-24 DIAGNOSIS — Z125 Encounter for screening for malignant neoplasm of prostate: Secondary | ICD-10-CM

## 2014-12-24 DIAGNOSIS — Z1331 Encounter for screening for depression: Secondary | ICD-10-CM

## 2014-12-24 DIAGNOSIS — E559 Vitamin D deficiency, unspecified: Secondary | ICD-10-CM | POA: Diagnosis not present

## 2014-12-24 DIAGNOSIS — R7303 Prediabetes: Secondary | ICD-10-CM

## 2014-12-24 LAB — CBC WITH DIFFERENTIAL/PLATELET
BASOS PCT: 1 % (ref 0–1)
Basophils Absolute: 0 10*3/uL (ref 0.0–0.1)
EOS ABS: 0.1 10*3/uL (ref 0.0–0.7)
Eosinophils Relative: 2 % (ref 0–5)
HEMATOCRIT: 38.8 % — AB (ref 39.0–52.0)
HEMOGLOBIN: 12.9 g/dL — AB (ref 13.0–17.0)
Lymphocytes Relative: 19 % (ref 12–46)
Lymphs Abs: 0.9 10*3/uL (ref 0.7–4.0)
MCH: 30.2 pg (ref 26.0–34.0)
MCHC: 33.2 g/dL (ref 30.0–36.0)
MCV: 90.9 fL (ref 78.0–100.0)
MONO ABS: 0.5 10*3/uL (ref 0.1–1.0)
MPV: 8.7 fL (ref 8.6–12.4)
Monocytes Relative: 11 % (ref 3–12)
NEUTROS ABS: 3.2 10*3/uL (ref 1.7–7.7)
Neutrophils Relative %: 67 % (ref 43–77)
Platelets: 202 10*3/uL (ref 150–400)
RBC: 4.27 MIL/uL (ref 4.22–5.81)
RDW: 14.2 % (ref 11.5–15.5)
WBC: 4.8 10*3/uL (ref 4.0–10.5)

## 2014-12-24 LAB — LIPID PANEL
Cholesterol: 150 mg/dL (ref 0–200)
HDL: 60 mg/dL
LDL Cholesterol: 80 mg/dL (ref 0–99)
Total CHOL/HDL Ratio: 2.5 ratio
Triglycerides: 52 mg/dL
VLDL: 10 mg/dL (ref 0–40)

## 2014-12-24 LAB — HEPATIC FUNCTION PANEL
ALBUMIN: 3.8 g/dL (ref 3.5–5.2)
ALT: 18 U/L (ref 0–53)
AST: 17 U/L (ref 0–37)
Alkaline Phosphatase: 48 U/L (ref 39–117)
Bilirubin, Direct: 0.1 mg/dL (ref 0.0–0.3)
Indirect Bilirubin: 0.4 mg/dL (ref 0.2–1.2)
Total Bilirubin: 0.5 mg/dL (ref 0.2–1.2)
Total Protein: 5.9 g/dL — ABNORMAL LOW (ref 6.0–8.3)

## 2014-12-24 LAB — BASIC METABOLIC PANEL WITH GFR
BUN: 23 mg/dL (ref 6–23)
CALCIUM: 8.6 mg/dL (ref 8.4–10.5)
CO2: 24 mEq/L (ref 19–32)
Chloride: 106 mEq/L (ref 96–112)
Creat: 0.95 mg/dL (ref 0.50–1.35)
GFR, EST AFRICAN AMERICAN: 89 mL/min
GFR, EST NON AFRICAN AMERICAN: 77 mL/min
Glucose, Bld: 95 mg/dL (ref 70–99)
Potassium: 4.1 mEq/L (ref 3.5–5.3)
Sodium: 140 mEq/L (ref 135–145)

## 2014-12-24 LAB — TSH: TSH: 1.792 u[IU]/mL (ref 0.350–4.500)

## 2014-12-24 LAB — HEMOGLOBIN A1C
Hgb A1c MFr Bld: 6.3 % — ABNORMAL HIGH
Mean Plasma Glucose: 134 mg/dL — ABNORMAL HIGH

## 2014-12-24 LAB — MAGNESIUM: Magnesium: 1.8 mg/dL (ref 1.5–2.5)

## 2014-12-24 NOTE — Patient Instructions (Signed)

## 2014-12-24 NOTE — Progress Notes (Signed)
Patient ID: Carlos Baldwin, male   DOB: Dec 08, 1934, 80 y.o.   MRN: [id number] Annual Comprehensive Examination  This very nice 80 y.o. MWM presents for complete physical.  Patient has been followed for HTN, Prediabetes, Hyperlipidemia, and Vitamin D Deficiency. Patient also has hx/o obstructive uropathy with sx's resolved on treatment with Flomax & Proscar.    HTN predates since 2003. Patient's BP has been controlled at home.Today's BP: 120/66 mmHg. Patient denies any cardiac symptoms as chest pain, palpitations, shortness of breath, dizziness or ankle swelling. 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    Patient's hyperlipidemia is controlled with diet and reiterate that he is Statin Intolerant. Patient denies myalgias or other medication SE's. Last lipids were Total Chol 150; HDL 60; LDL  80; Triglycerides 52 on 12/24/2014.   Patient has prediabetes since Mar 2011 with A1c 6.1% and patient denies reactive hypoglycemic symptoms, visual blurring, diabetic polys or paresthesias. Last A1c was still 6.1% in Apr 2015.He has lost ~ 15 # over the last year attributed to better food choices.     Finally, patient has history of Vitamin D Deficiency of 35 in 2008 and last vitamin D was  93 on 06/27/2014.  Medication Sig  . VITAMIN D 1000 UNITS tablet Take 6,000 Units by mouth daily.  . finasteride  5 MG tablet Take one tablet by mouth one time daily to shrink prostate.  Marland Kitchen losartan 100 MG tablet Take 1 tablet (100 mg total) by mouth daily.  . meloxicam  15 MG tablet Take 1 tablet (15 mg total) by mouth daily.  . tamsulosin  0.4 MG CAPS  Take 1 tablet by mouth at bedtime for prostate   Allergies  Allergen Reactions  . Atenolol Other (See Comments)    ED  . Pravastatin   . Simvastatin   . Sulfonamide Derivatives    Past Medical History  Diagnosis Date  . Hyperlipidemia   . Hypertension    Health  Maintenance  Topic Date Due  . INFLUENZA VACCINE  04/08/2015  . COLONOSCOPY  11/06/2018  . TETANUS/TDAP  02/21/2019  . ZOSTAVAX  Completed  . PNA vac Low Risk Adult  Completed   Immunization History  Administered Date(s) Administered  . Pneumococcal Conjugate-13 06/27/2014  . Pneumococcal Polysaccharide-23 09/07/2006  . Zoster 09/07/2008   Past Surgical History  Procedure Laterality Date  . Knee surgery Right    History   Social History  . Marital Status: Married    Spouse Name: N/A  . Number of Children: N/A  . Years of Education: N/A   Occupational History  . 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 Smoking status: Never Smoker   . Smokeless tobacco: Not on file  . Alcohol Use: No  . Drug Use: No  . Sexual Activity: Not on file    ROS Constitutional: Denies fever, chills, weight loss/gain, headaches, insomnia,  night sweats or change in appetite. Does c/o fatigue. Eyes: Denies redness, blurred vision, diplopia, discharge, itchy or watery eyes.  ENT: Denies discharge, congestion, post nasal drip, epistaxis, sore throat, earache, hearing loss, dental pain, Tinnitus, Vertigo, Sinus pain or snoring.  Cardio: Denies chest pain, palpitations, irregular heartbeat, syncope, dyspnea, diaphoresis, orthopnea, PND, claudication or edema Respiratory: denies cough, dyspnea, DOE, pleurisy, hoarseness, laryngitis or wheezing.  Gastrointestinal: Denies  dysphagia, heartburn, reflux, water brash, pain, cramps, nausea, vomiting, bloating, diarrhea, constipation, hematemesis, melena, hematochezia, jaundice or hemorrhoids Genitourinary: Denies dysuria, frequency, urgency, nocturia, hesitancy, discharge, hematuria or flank pain Musculoskeletal: Denies arthralgia, myalgia, stiffness, Jt. Swelling, pain, limp or strain/sprain. Denies Falls. Skin: Denies puritis, rash, hives, warts, acne, eczema or change in  skin lesion Neuro: No weakness, tremor, incoordination, spasms, paresthesia or pain Psychiatric: Denies confusion, memory loss or sensory loss. Denies Depression. Endocrine: Denies change in weight, skin, hair change, nocturia, and paresthesia, diabetic polys, visual blurring or hyper / hypo glycemic episodes.  Heme/Lymph: No excessive bleeding, bruising or enlarged lymph nodes.  Physical Exam  BP 120/66 mmHg  Pulse 80  Temp(Src) 97.7 F (36.5 C)  Resp 16  Ht 5\' 9"  (1.753 m)  Wt 170 lb 12.8 oz (77.474 kg)  BMI 25.21 kg/m2  General Appearance: Well nourished, in no apparent distress. Eyes: PERRLA, EOMs, conjunctiva no swelling or erythema, normal fundi and vessels. Sinuses: No frontal/maxillary tenderness ENT/Mouth: EACs patent / TMs  nl. Nares clear without erythema, swelling, mucoid exudates. Oral hygiene is good. No erythema, swelling, or exudate. Tongue normal, non-obstructing. Tonsils not swollen or erythematous. Hearing normal.  Neck: Supple, thyroid normal. No bruits, nodes or JVD. Respiratory: Respiratory effort normal.  BS equal and clear bilateral without rales, rhonci, wheezing or stridor. Cardio: Heart sounds are normal with regular rate and rhythm and no murmurs, rubs or gallops. Peripheral pulses are normal and equal bilaterally without edema. No aortic or femoral bruits. Chest: symmetric with normal excursions and percussion.  Abdomen: Flat, soft, with bowl sounds. Nontender, no guarding, rebound, hernias, masses, or organomegaly.  Lymphatics: Non tender without lymphadenopathy.  Genitourinary: No hernias.Testes nl. DRE - prostate nl for age - smooth & firm w/o nodules. Musculoskeletal: Full ROM all peripheral extremities, joint stability, 5/5 strength, and normal gait. Skin: Warm and dry without rashes, lesions, cyanosis, clubbing or  ecchymosis.  Neuro: Cranial nerves intact, reflexes equal bilaterally. Normal muscle tone, no cerebellar symptoms. Sensation intact.   Pysch: Awake and oriented X 3 with normal affect, insight and judgment appropriate.   Assessment and Plan  1. Essential hypertension  - Microalbumin / creatinine urine ratio - EKG 12-Lead - [place], RETROPERITNL ABD,  LTD - TSH  2. Hyperlipidemia  - Lipid panel  3. Prediabetes  - Hemoglobin A1c - Insulin, random  4. Vitamin D deficiency  - Vit D  25 hydroxy   5. Bladder neck obstruction   6. Screening for rectal cancer  - POC Hemoccult Bld/Stl    7. Prostate cancer screening  - PSA  8. At low risk for fall   9. Encounter for long-term (current) use of medications  - Urine Microscopic; Future - CBC with Differential/Platelet - BASIC METABOLIC PANEL WITH GFR - Hepatic function panel - Magnesium  10. Depression screen  - screen Negative    Continue prudent diet as discussed, weight control, BP monitoring, regular exercise, and medications as discussed.  Discussed med effects and SE's. Routine screening labs and tests as requested with regular follow-up as recommended. Over 40 minutes of exam, counseling &  chart review was performed

## 2014-12-25 LAB — MICROALBUMIN / CREATININE URINE RATIO
Creatinine, Urine: 138.2 mg/dL
Microalb Creat Ratio: 5.1 mg/g (ref 0.0–30.0)
Microalb, Ur: 0.7 mg/dL (ref <2.0)

## 2014-12-25 LAB — PSA: PSA: 0.43 ng/mL (ref <=4.00)

## 2014-12-25 LAB — VITAMIN D 25 HYDROXY (VIT D DEFICIENCY, FRACTURES): Vit D, 25-Hydroxy: 57 ng/mL (ref 30–100)

## 2014-12-25 LAB — INSULIN, RANDOM: Insulin: 7.2 u[IU]/mL (ref 2.0–19.6)

## 2014-12-26 DIAGNOSIS — M1712 Unilateral primary osteoarthritis, left knee: Secondary | ICD-10-CM | POA: Diagnosis not present

## 2015-01-02 DIAGNOSIS — M1712 Unilateral primary osteoarthritis, left knee: Secondary | ICD-10-CM | POA: Diagnosis not present

## 2015-01-04 ENCOUNTER — Other Ambulatory Visit: Payer: Self-pay | Admitting: Internal Medicine

## 2015-01-07 ENCOUNTER — Other Ambulatory Visit: Payer: Self-pay | Admitting: *Deleted

## 2015-01-07 DIAGNOSIS — Z1212 Encounter for screening for malignant neoplasm of rectum: Secondary | ICD-10-CM

## 2015-01-07 LAB — POC HEMOCCULT BLD/STL (HOME/3-CARD/SCREEN)
Card #3 Fecal Occult Blood, POC: NEGATIVE
FECAL OCCULT BLD: NEGATIVE
Fecal Occult Blood, POC: NEGATIVE

## 2015-01-08 ENCOUNTER — Encounter: Payer: Self-pay | Admitting: Internal Medicine

## 2015-01-09 ENCOUNTER — Other Ambulatory Visit: Payer: Self-pay | Admitting: Internal Medicine

## 2015-01-09 DIAGNOSIS — M1712 Unilateral primary osteoarthritis, left knee: Secondary | ICD-10-CM | POA: Diagnosis not present

## 2015-01-28 ENCOUNTER — Encounter: Payer: Self-pay | Admitting: Gastroenterology

## 2015-02-22 ENCOUNTER — Other Ambulatory Visit: Payer: Self-pay | Admitting: Physician Assistant

## 2015-03-08 DIAGNOSIS — M1712 Unilateral primary osteoarthritis, left knee: Secondary | ICD-10-CM | POA: Diagnosis not present

## 2015-04-20 ENCOUNTER — Other Ambulatory Visit: Payer: Self-pay | Admitting: Physician Assistant

## 2015-04-29 ENCOUNTER — Other Ambulatory Visit: Payer: Self-pay | Admitting: Internal Medicine

## 2015-06-21 DIAGNOSIS — M1712 Unilateral primary osteoarthritis, left knee: Secondary | ICD-10-CM | POA: Diagnosis not present

## 2015-06-25 ENCOUNTER — Ambulatory Visit: Payer: Self-pay | Admitting: Internal Medicine

## 2015-07-30 DIAGNOSIS — M1712 Unilateral primary osteoarthritis, left knee: Secondary | ICD-10-CM | POA: Diagnosis not present

## 2015-08-28 ENCOUNTER — Other Ambulatory Visit: Payer: Self-pay | Admitting: Internal Medicine

## 2015-08-29 ENCOUNTER — Other Ambulatory Visit: Payer: Self-pay | Admitting: Internal Medicine

## 2015-09-06 DIAGNOSIS — M1712 Unilateral primary osteoarthritis, left knee: Secondary | ICD-10-CM | POA: Diagnosis not present

## 2015-09-06 DIAGNOSIS — M1711 Unilateral primary osteoarthritis, right knee: Secondary | ICD-10-CM | POA: Diagnosis not present

## 2015-09-06 DIAGNOSIS — M170 Bilateral primary osteoarthritis of knee: Secondary | ICD-10-CM | POA: Diagnosis not present

## 2015-09-15 ENCOUNTER — Encounter: Payer: Self-pay | Admitting: Internal Medicine

## 2015-09-20 DIAGNOSIS — H52203 Unspecified astigmatism, bilateral: Secondary | ICD-10-CM | POA: Diagnosis not present

## 2015-09-20 DIAGNOSIS — H11003 Unspecified pterygium of eye, bilateral: Secondary | ICD-10-CM | POA: Diagnosis not present

## 2015-09-20 DIAGNOSIS — H2513 Age-related nuclear cataract, bilateral: Secondary | ICD-10-CM | POA: Diagnosis not present

## 2015-10-08 DIAGNOSIS — M170 Bilateral primary osteoarthritis of knee: Secondary | ICD-10-CM | POA: Diagnosis not present

## 2015-10-10 ENCOUNTER — Ambulatory Visit: Payer: Self-pay | Admitting: Orthopedic Surgery

## 2015-10-10 NOTE — H&P (Signed)
Carlos Baldwin DOB: Jan 24, 1935 Married / Language: English / Race: White Male Date of Admission: 11-04-2015 CC:  Left Knee Pain History of Present Illness The patient is a 81 year old male who comes in for a preoperative History and Physical. The patient is scheduled for a left total knee arthroplasty to be performed by Dr. Dione Plover. Aluisio, MD at [facility] on 11-04-2015. The patient reports left knee symptoms including: pain, swelling, instability, giving way, weakness and grinding which began 5 year(s) ago following a specific injury. The injury occurred due to twisting of the knee (while cutting a tree limb).The patient feels that the symptoms are worsening. The patient has the current diagnosis of knee osteoarthritis. Prior to being seen, the patient was previously evaluated by a colleague (Dr. Durward Fortes) 5 year(s) ago. Previous work-up for this problem has included knee x-rays and arthroscopy (he does not remeber the date). Past treatment for this problem has included intra-articular injection of corticosteroids (as well as viscosupplementation). This 81 year old [place] has been having problems with his knees off and on for years. He had an arthrotomy with meniscectomy on his right knee in the 1970s and had an arthroscopy with medial meniscectomy by Dr. Durward Fortes approximately five years ago. 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 It does catch, lock or give way. He went to [facility] and had viscosupplementation which he states did not help at all. The left knee has been bothering him for many years now. He has problems with both knees but the left is more symptomatic. The knees are essentially taking over his life and preventing him from doing what he  desires. He has had injections in the past without much benefit. He saw Richardson Landry about a month ago and options were discussed and [name] opted for a knee replacement. He is now ready to proceed with surgery. They have been treated conservatively in the past for the above stated problem and despite conservative measures, they continue to have progressive pain and severe functional limitations and dysfunction. They have failed non-operative management including home exercise, medications, and injections. It is felt that they would benefit from undergoing total joint replacement. Risks and benefits of the procedure have been discussed with the patient and they elect to proceed with surgery. There are no active contraindications to surgery such as ongoing infection or rapidly progressive neurological disease.  Problem List/Past Medical  Primary osteoarthritis of right knee (M17.11)  Asthma  Childhood High blood pressure  Measles  Mumps  Allergies SulfADIAZINE *Sulfonamides  Rash, Swelling, Swollen lips. Swelling Tongue  Family History Osteoarthritis  Mother.  Social History Not under pain contract  No history of drug/alcohol rehab  Number of flights of stairs before winded  2-3 Tobacco use  Never smoker. 06/21/2015 Tobacco / smoke exposure  06/21/2015: no Never consumed alcohol  06/21/2015: Never consumed alcohol Current work status  working full time Children  4 Exercise  Exercises daily; does other and gym / [employer] Marital status  married Living situation  live with spouse Carlos Baldwin with wife Advance Directives  Living Will  Medication History Losartan Potassium (50MG  Tablet, Oral daily) Active. Finasteride (5MG  Tablet, Oral daily) Active. Tamsulosin HCl (0.4MG  Capsule, Oral daily) Active. Meloxicam (15MG  Tablet, Oral  as needed) Active. Vitamin C (Oral) Specific strength unknown - Active. Vitamin D (Oral) Specific strength unknown -  Active. Vitamin B12 (Oral) Specific strength unknown - Active. Fish Oil (Oral) Specific strength unknown - Active.  Past Surgical History  Arthroscopy of Knee  left Vasectomy  Right Knee Cartiledge Repair    Review of Systems General Not Present- Chills, Fatigue, Fever, Memory Loss, Night Sweats, Weight Gain and Weight Loss. Skin Not Present- Eczema, Hives, Itching, Lesions and Rash. HEENT Not Present- Dentures, Double Vision, Headache, Hearing Loss, Tinnitus and Visual Loss. Respiratory Not Present- Allergies, Chronic Cough, Coughing up blood, Shortness of breath at rest and Shortness of breath with exertion. Cardiovascular Not Present- Chest Pain, Difficulty Breathing Lying Down, Murmur, Palpitations, Racing/skipping heartbeats and Swelling. Gastrointestinal Not Present- Abdominal Pain, Bloody Stool, Constipation, Diarrhea, Difficulty Swallowing, Heartburn, Jaundice, Loss of appetitie, Nausea and Vomiting. Male Genitourinary Present- Urinating at Night. Not Present- Blood in Urine, Discharge, Flank Pain, Incontinence, Painful Urination, Urgency, Urinary frequency, Urinary Retention and Weak urinary stream. Musculoskeletal Present- Joint Pain. Not Present- Back Pain, Joint Swelling, Morning Stiffness, Muscle Pain, Muscle Weakness and Spasms. Neurological Not Present- Blackout spells, Difficulty with balance, Dizziness, Paralysis, Tremor and Weakness. Psychiatric Not Present- Insomnia.  Vitals  Weight: 175 lb Height: 69in Body Surface Area: 1.95 m Body Mass Index: 25.84 kg/m  Pulse: 88 (Regular)  BP: 128/62 (Sitting, Right Arm, Standard)   Physical Exam General Mental Status -Alert, cooperative and good historian. General Appearance-pleasant, Not in acute distress. Orientation-Oriented X3. Build & Nutrition-Well nourished and Well developed.  Head and Neck Head-normocephalic, atraumatic . Neck Global Assessment - supple, no bruit auscultated on the  right, no bruit auscultated on the left.  Eye Vision-Wears corrective lenses. Pupil - Bilateral-Regular and Round. Motion - Bilateral-EOMI.  Chest and Lung Exam Auscultation Breath sounds - clear at anterior chest wall and clear at posterior chest wall. Adventitious sounds - No Adventitious sounds.  Cardiovascular Auscultation Rhythm - Regular rate and rhythm. Heart Sounds - S1 WNL and S2 WNL. Murmurs & Other Heart Sounds - Auscultation of the heart reveals - No Murmurs.  Abdomen Palpation/Percussion Tenderness - Abdomen is non-tender to palpation. Rigidity (guarding) - Abdomen is soft. Auscultation Auscultation of the abdomen reveals - Bowel sounds normal.  Male Genitourinary Note: Not done, not pertinent to present illness   Musculoskeletal Note: On exam, he is a well developed male, alert and oriented, in no apparent distress. His hip show normal motion with no discomfort. His right knee shows range about 5 to 125 with crepitus on range of motion. No tenderness or instability. The left knee varus deformity, range about 5 to 115. Marked crepitus on range of motion. Tenderness medial greater than lateral. No instability.  RADIOGRAPHS Show end stage arthritic change tricompartmental in nature and the left worse than right knee.   Assessment & Plan Primary osteoarthritis of right knee (M17.11) Primary osteoarthritis of left knee (M17.12)  Note:Surgical Plans: Left Total Knee Replacement4  Disposition: Home with wife, Debbie  PCP: Dr. Melford Aase  IV TXA  Anesthesia Issues: None  Signed electronically by Joelene Millin, III PA-C

## 2015-10-10 NOTE — Progress Notes (Signed)
Preoperative surgical orders have been place into the Epic hospital system for NOVIAN SCHUENEMAN on 10/10/2015, 3:24 PM  by Mickel Crow for surgery on 11-04-2015.  Preop Total Knee orders including Experal, IV Tylenol, and IV Decadron as long as there are no contraindications to the above medications. Arlee Muslim, PA-C

## 2015-10-10 NOTE — H&P (Signed)
Carlos Baldwin DOB: 1935/06/29 Married / Language: English / Race: White Male Date of Admission: 11-04-2015 CC: Left Knee Pain History of Present Illness The patient is a 81 year old male who comes in for a preoperative History and Physical. The patient is scheduled for a left total knee arthroplasty to be performed by Dr. Dione Plover. Aluisio, MD at [facility] on 11-04-2015. The patient reports left knee symptoms including: pain, swelling, instability, giving way, weakness and grinding which began 5 year(s) ago following a specific injury. The injury occurred due to twisting of the knee (while cutting a tree limb).The patient feels that the symptoms are worsening. The patient has the current diagnosis of knee osteoarthritis. Prior to being seen, the patient was previously evaluated by a colleague (Dr. Durward Fortes) 5 year(s) ago. Previous work-up for this problem has included knee x-rays and arthroscopy (he does not remeber the date). Past treatment for this problem has included intra-articular injection of corticosteroids (as well as viscosupplementation). This 81 year [place] has been having problems with his knees off and on for years. He had an arthrotomy with meniscectomy on his right knee in the 1970s and had an arthroscopy with medial meniscectomy by Dr. Durward Fortes approximately five years ago. 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 It does catch, lock or give way. He went to [facility] and had viscosupplementation which he states did not help at all. The left knee has been bothering him for many years now. He has problems with both knees but the left is more symptomatic. The knees are essentially taking over his life and preventing him from doing what he  desires. He has had injections in the past without much benefit. He saw Richardson Landry about a month ago and options were discussed and Mr. Freyre opted for a knee replacement. He is now ready to proceed with surgery. They have been treated conservatively in the past for the above stated problem and despite conservative measures, they continue to have progressive pain and severe functional limitations and dysfunction. They have failed non-operative management including home exercise, medications, and injections. It is felt that they would benefit from undergoing total joint replacement. Risks and benefits of the procedure have been discussed with the patient and they elect to proceed with surgery. There are no active contraindications to surgery such as ongoing infection or rapidly progressive neurological disease.  Problem List/Past Medical  Primary osteoarthritis of right knee (M17.11)  Asthma  Childhood High blood pressure  Measles  Mumps  Allergies SulfADIAZINE *Sulfonamides  Rash, Swelling, Swollen lips. Swelling Tongue  Family History Osteoarthritis  Mother.  Social History Not under pain contract  No history of drug/alcohol rehab  Number of flights of stairs before winded  2-3 Tobacco use  Never smoker. 06/21/2015 Tobacco / smoke exposure  06/21/2015: no Never consumed alcohol  06/21/2015: Never consumed alcohol Current work status  working full time Children  4 Exercise  Exercises daily; does other and gym / [employer] Marital status  married Living situation  live with spouse [place] with wife Advance Directives  Living Will  Medication History Losartan Potassium (50MG  Tablet, Oral daily) Active. Finasteride (5MG  Tablet, Oral daily) Active. Tamsulosin HCl (0.4MG  Capsule, Oral daily) Active. Meloxicam (15MG  Tablet, Oral as  needed) Active. Vitamin C (Oral) Specific strength unknown - Active. Vitamin D (Oral) Specific strength unknown -  Active. Vitamin B12 (Oral) Specific strength unknown - Active. Fish Oil (Oral) Specific strength unknown - Active.  Past Surgical History  Arthroscopy of Knee  left Vasectomy  Right Knee Cartiledge Repair    Review of Systems General Not Present- Chills, Fatigue, Fever, Memory Loss, Night Sweats, Weight Gain and Weight Loss. Skin Not Present- Eczema, Hives, Itching, Lesions and Rash. HEENT Not Present- Dentures, Double Vision, Headache, Hearing Loss, Tinnitus and Visual Loss. Respiratory Not Present- Allergies, Chronic Cough, Coughing up blood, Shortness of breath at rest and Shortness of breath with exertion. Cardiovascular Not Present- Chest Pain, Difficulty Breathing Lying Down, Murmur, Palpitations, Racing/skipping heartbeats and Swelling. Gastrointestinal Not Present- Abdominal Pain, Bloody Stool, Constipation, Diarrhea, Difficulty Swallowing, Heartburn, Jaundice, Loss of appetitie, Nausea and Vomiting. Male Genitourinary Present- Urinating at Night. Not Present- Blood in Urine, Discharge, Flank Pain, Incontinence, Painful Urination, Urgency, Urinary frequency, Urinary Retention and Weak urinary stream. Musculoskeletal Present- Joint Pain. Not Present- Back Pain, Joint Swelling, Morning Stiffness, Muscle Pain, Muscle Weakness and Spasms. Neurological Not Present- Blackout spells, Difficulty with balance, Dizziness, Paralysis, Tremor and Weakness. Psychiatric Not Present- Insomnia.  Vitals  Weight: 175 lb Height: 69in Body Surface Area: 1.95 m Body Mass Index: 25.84 kg/m  Pulse: 88 (Regular)  BP: 128/62 (Sitting, Right Arm, Standard)   Physical Exam General Mental Status -Alert, cooperative and good historian. General Appearance-pleasant, Not in acute distress. Orientation-Oriented X3. Build & Nutrition-Well nourished and Well developed.  Head and Neck Head-normocephalic, atraumatic . Neck Global Assessment - supple, no bruit auscultated on the  right, no bruit auscultated on the left.  Eye Vision-Wears corrective lenses. Pupil - Bilateral-Regular and Round. Motion - Bilateral-EOMI.  Chest and Lung Exam Auscultation Breath sounds - clear at anterior chest wall and clear at posterior chest wall. Adventitious sounds - No Adventitious sounds.  Cardiovascular Auscultation Rhythm - Regular rate and rhythm. Heart Sounds - S1 WNL and S2 WNL. Murmurs & Other Heart Sounds - Auscultation of the heart reveals - No Murmurs.  Abdomen Palpation/Percussion Tenderness - Abdomen is non-tender to palpation. Rigidity (guarding) - Abdomen is soft. Auscultation Auscultation of the abdomen reveals - Bowel sounds normal.  Male Genitourinary Note: Not done, not pertinent to present illness   Musculoskeletal Note: On exam, he is a well developed male, alert and oriented, in no apparent distress. His hip show normal motion with no discomfort. His right knee shows range about 5 to 125 with crepitus on range of motion. No tenderness or instability. The left knee varus deformity, range about 5 to 115. Marked crepitus on range of motion. Tenderness medial greater than lateral. No instability.  RADIOGRAPHS Show end stage arthritic change tricompartmental in nature and the left worse than right knee.   Assessment & Plan Primary osteoarthritis of right knee (M17.11) Primary osteoarthritis of left knee (M17.12)  Note:Surgical Plans: Left Total Knee Replacement4  Disposition: Home with wife, Debbie  PCP: Dr. Melford Aase  IV TXA  Anesthesia Issues: None  Signed electronically by Joelene Millin, III PA-C

## 2015-10-14 ENCOUNTER — Ambulatory Visit: Payer: Self-pay | Admitting: Orthopedic Surgery

## 2015-10-23 ENCOUNTER — Other Ambulatory Visit: Payer: Self-pay | Admitting: Internal Medicine

## 2015-10-24 ENCOUNTER — Other Ambulatory Visit (HOSPITAL_COMMUNITY): Payer: Self-pay | Admitting: *Deleted

## 2015-10-24 NOTE — Progress Notes (Signed)
ekg 12-24-14 epic

## 2015-10-24 NOTE — Patient Instructions (Addendum)
Carlos Baldwin  10/24/2015   Your procedure is scheduled on: 11-04-15  Report to [facility]  Entrance take [facility]  elevators to 3rd floor to  [place] at 625  AM.  Call this number if you have problems the morning of surgery [phone]   Remember: ONLY 1 PERSON MAY GO WITH YOU TO SHORT STAY TO GET  READY MORNING OF [name].  Do not eat food or drink liquids :After Midnight.     Take these medicines the morning of surgery with A SIP OF WATER: finasteride(proscar)                               You may not have any metal on your body including hair pins and              piercings  Do not wear jewelry, make-up, lotions, powders or perfumes, deodorant             Do not wear nail polish.  Do not shave  48 hours prior to surgery.              Men may shave face and neck.   Do not bring valuables to the hospital. [place].  Contacts, dentures or bridgework may not be worn into surgery.  Leave suitcase in the car. After surgery it may be brought to your room.                Please read over the following fact sheets you were given: _____________________________________________________________________             [facility] - Preparing for Surgery Before surgery, you can play an important role.  Because skin is not sterile, your skin needs to be as free of germs as possible.  You can reduce the number of germs on your skin by washing with CHG (chlorahexidine gluconate) soap before surgery.  CHG is an antiseptic cleaner which kills germs and bonds with the skin to continue killing germs even after washing. Please DO NOT use if you have an allergy to CHG or antibacterial soaps.  If your skin becomes reddened/irritated stop using the CHG and inform your nurse when you arrive at Short Stay. Do not shave (including legs and underarms) for at least 48 hours prior to the first CHG shower.  You may  shave your face/neck. Please follow these instructions carefully:  1.  Shower with CHG Soap the night before surgery and the  morning of Surgery.  2.  If you choose to wash your hair, wash your hair first as usual with your  normal  shampoo.  3.  After you shampoo, rinse your hair and body thoroughly to remove the  shampoo.                           4.  Use CHG as you would any other liquid soap.  You can apply chg directly  to the skin and wash                       Gently with a scrungie or clean washcloth.  5.  Apply the CHG Soap to your  body ONLY FROM THE NECK DOWN.   Do not use on face/ open                           Wound or open sores. Avoid contact with eyes, ears mouth and genitals (private parts).                       Wash face,  Genitals (private parts) with your normal soap.             6.  Wash thoroughly, paying special attention to the area where your surgery  will be performed.  7.  Thoroughly rinse your body with warm water from the neck down.  8.  DO NOT shower/wash with your normal soap after using and rinsing off  the CHG Soap.                9.  Pat yourself dry with a clean towel.            10.  Wear clean pajamas.            11.  Place clean sheets on your bed the night of your first shower and do not  sleep with pets. Day of Surgery : Do not apply any lotions/deodorants the morning of surgery.  Please wear clean clothes to the hospital/surgery center.  FAILURE TO FOLLOW THESE INSTRUCTIONS MAY RESULT IN THE CANCELLATION OF YOUR SURGERY PATIENT SIGNATURE_________________________________  NURSE SIGNATURE__________________________________  ________________________________________________________________________   Carlos Baldwin  An incentive spirometer is a tool that can help keep your lungs clear and active. This tool measures how well you are filling your lungs with each breath. Taking long deep breaths may help reverse or decrease the chance of developing  breathing (pulmonary) problems (especially infection) following:  A long period of time when you are unable to move or be active. BEFORE THE PROCEDURE   If the spirometer includes an indicator to show your best effort, your nurse or respiratory therapist will set it to a desired goal.  If possible, sit up straight or lean slightly forward. Try not to slouch.  Hold the incentive spirometer in an upright position. INSTRUCTIONS FOR USE   Sit on the edge of your bed if possible, or sit up as far as you can in bed or on a chair.  Hold the incentive spirometer in an upright position.  Breathe out normally.  Place the mouthpiece in your mouth and seal your lips tightly around it.  Breathe in slowly and as deeply as possible, raising the piston or the ball toward the top of the column.  Hold your breath for 3-5 seconds or for as long as possible. Allow the piston or ball to fall to the bottom of the column.  Remove the mouthpiece from your mouth and breathe out normally.  Rest for a few seconds and repeat Steps 1 through 7 at least 10 times every 1-2 hours when you are awake. Take your time and take a few normal breaths between deep breaths.  The spirometer may include an indicator to show your best effort. Use the indicator as a goal to work toward during each repetition.  After each set of 10 deep breaths, practice coughing to be sure your lungs are clear. If you have an incision (the cut made at the time of surgery), support your incision when coughing by placing a pillow or rolled up towels firmly against it.  Once you are able to get out of bed, walk around indoors and cough well. You may stop using the incentive spirometer when instructed by your caregiver.  RISKS AND COMPLICATIONS  Take your time so you do not get dizzy or light-headed.  If you are in pain, you may need to take or ask for pain medication before doing incentive spirometry. It is harder to take a deep breath if you  are having pain. AFTER USE  Rest and breathe slowly and easily.  It can be helpful to keep track of a log of your progress. Your caregiver can provide you with a simple table to help with this. If you are using the spirometer at home, follow these instructions: [place] IF:   You are having difficultly using the spirometer.  You have trouble using the spirometer as often as instructed.  Your pain medication is not giving enough relief while using the spirometer.  You develop fever of 100.5 F (38.1 C) or higher. SEEK IMMEDIATE MEDICAL CARE IF:   You cough up bloody sputum that had not been present before.  You develop fever of 102 F (38.9 C) or greater.  You develop worsening pain at or near the incision site. MAKE SURE YOU:   Understand these instructions.  Will watch your condition.  Will get help right away if you are not doing well or get worse. Document Released: 01/04/2007 Document Revised: 11/16/2011 Document Reviewed: 03/07/2007 ExitCare Patient Information 2014 ExitCare, [place].   ________________________________________________________________________  WHAT IS A BLOOD TRANSFUSION? Blood Transfusion Information  A transfusion is the replacement of blood or some of its parts. Blood is made up of multiple cells which provide different functions.  Red blood cells carry oxygen and are used for blood loss replacement.  White blood cells fight against infection.  Platelets control bleeding.  Plasma helps clot blood.  Other blood products are available for specialized needs, such as hemophilia or other clotting disorders. BEFORE THE TRANSFUSION  Who gives blood for transfusions?   Healthy volunteers who are fully evaluated to make sure their blood is safe. This is blood bank blood. Transfusion therapy is the safest it has ever been in the practice of medicine. Before blood is taken from a donor, a complete history is taken to make sure that person has  no history of diseases nor engages in risky social behavior (examples are intravenous drug use or sexual activity with multiple partners). The donor's travel history is screened to minimize risk of transmitting infections, such as malaria. The donated blood is tested for signs of infectious diseases, such as HIV and hepatitis. The blood is then tested to be sure it is compatible with you in order to minimize the chance of a transfusion reaction. If you or a relative donates blood, this is often done in anticipation of surgery and is not appropriate for emergency situations. It takes many days to process the donated blood. RISKS AND COMPLICATIONS Although transfusion therapy is very safe and saves many lives, the main dangers of transfusion include:   Getting an infectious disease.  Developing a transfusion reaction. This is an allergic reaction to something in the blood you were given. Every precaution is taken to prevent this. The decision to have a blood transfusion has been considered carefully by your caregiver before blood is given. Blood is not given unless the benefits outweigh the risks. AFTER THE TRANSFUSION  Right after receiving a blood transfusion, you will usually feel much better and more energetic.  This is especially true if your red blood cells have gotten low (anemic). The transfusion raises the level of the red blood cells which carry oxygen, and this usually causes an energy increase.  The nurse administering the transfusion will monitor you carefully for complications. HOME CARE INSTRUCTIONS  No special instructions are needed after a transfusion. You may find your energy is better. Speak with your caregiver about any limitations on activity for underlying diseases you may have. SEEK MEDICAL CARE IF:   Your condition is not improving after your transfusion.  You develop redness or irritation at the intravenous (IV) site. SEEK IMMEDIATE MEDICAL CARE IF:  Any of the following  symptoms occur over the next 12 hours:  Shaking chills.  You have a temperature by mouth above 102 F (38.9 C), not controlled by medicine.  Chest, back, or muscle pain.  People around you feel you are not acting correctly or are confused.  Shortness of breath or difficulty breathing.  Dizziness and fainting.  You get a rash or develop hives.  You have a decrease in urine output.  Your urine turns a dark color or changes to pink, red, or brown. Any of the following symptoms occur over the next 10 days:  You have a temperature by mouth above 102 F (38.9 C), not controlled by medicine.  Shortness of breath.  Weakness after normal activity.  The white part of the eye turns yellow (jaundice).  You have a decrease in the amount of urine or are urinating less often.  Your urine turns a dark color or changes to pink, red, or brown. Document Released: 08/21/2000 Document Revised: 11/16/2011 Document Reviewed: 04/09/2008 [facility] Patient Information 2014 [place], [place].  _______________________________________________________________________

## 2015-10-25 ENCOUNTER — Encounter (HOSPITAL_COMMUNITY)
Admission: RE | Admit: 2015-10-25 | Discharge: 2015-10-25 | Disposition: A | Discharge status: Home / Self Care | Payer: Medicare Other | Source: Ambulatory Visit | Attending: Orthopedic Surgery | Admitting: Orthopedic Surgery

## 2015-10-25 ENCOUNTER — Encounter (HOSPITAL_COMMUNITY): Payer: Self-pay

## 2015-10-25 DIAGNOSIS — M1712 Unilateral primary osteoarthritis, left knee: Secondary | ICD-10-CM | POA: Diagnosis not present

## 2015-10-25 DIAGNOSIS — Z0183 Encounter for blood typing: Secondary | ICD-10-CM | POA: Diagnosis not present

## 2015-10-25 DIAGNOSIS — Z01812 Encounter for preprocedural laboratory examination: Secondary | ICD-10-CM | POA: Diagnosis not present

## 2015-10-25 HISTORY — DX: Unspecified osteoarthritis, unspecified site: M19.90

## 2015-10-25 HISTORY — DX: Unspecified asthma, uncomplicated: J45.909

## 2015-10-25 HISTORY — DX: Measles without complication: B05.9

## 2015-10-25 HISTORY — DX: Mumps without complication: B26.9

## 2015-10-25 LAB — COMPREHENSIVE METABOLIC PANEL
ALT: 18 U/L (ref 17–63)
AST: 22 U/L (ref 15–41)
Albumin: 4 g/dL (ref 3.5–5.0)
Alkaline Phosphatase: 60 U/L (ref 38–126)
Anion gap: 9 (ref 5–15)
BUN: 28 mg/dL — AB (ref 6–20)
CHLORIDE: 105 mmol/L (ref 101–111)
CO2: 24 mmol/L (ref 22–32)
CREATININE: 0.9 mg/dL (ref 0.61–1.24)
Calcium: 8.8 mg/dL — ABNORMAL LOW (ref 8.9–10.3)
GFR calc Af Amer: >60 mL/min (ref >60)
GFR calc non Af Amer: >60 mL/min (ref >60)
GLUCOSE: 93 mg/dL (ref 65–99)
POTASSIUM: 4.3 mmol/L (ref 3.5–5.1)
Sodium: 138 mmol/L (ref 135–145)
Total Bilirubin: 0.7 mg/dL (ref 0.3–1.2)
Total Protein: 6.6 g/dL (ref 6.5–8.1)

## 2015-10-25 LAB — CBC
HEMATOCRIT: 39.8 % (ref 39.0–52.0)
Hemoglobin: 13.2 g/dL (ref 13.0–17.0)
MCH: 30.5 pg (ref 26.0–34.0)
MCHC: 33.2 g/dL (ref 30.0–36.0)
MCV: 91.9 fL (ref 78.0–100.0)
Platelets: 212 10*3/uL (ref 150–400)
RBC: 4.33 MIL/uL (ref 4.22–5.81)
RDW: 13.6 % (ref 11.5–15.5)
WBC: 7.1 10*3/uL (ref 4.0–10.5)

## 2015-10-25 LAB — APTT: aPTT: 28 seconds (ref 24–37)

## 2015-10-25 LAB — URINALYSIS, ROUTINE W REFLEX MICROSCOPIC
Bilirubin Urine: NEGATIVE
Glucose, UA: NEGATIVE mg/dL
HGB URINE DIPSTICK: NEGATIVE
Ketones, ur: NEGATIVE mg/dL
Leukocytes, UA: NEGATIVE
NITRITE: NEGATIVE
PH: 6.5 (ref 5.0–8.0)
Protein, ur: NEGATIVE mg/dL
SPECIFIC GRAVITY, URINE: 1.021 (ref 1.005–1.030)

## 2015-10-25 LAB — PROTIME-INR
INR: 1.14 (ref 0.00–1.49)
Prothrombin Time: 14.8 seconds (ref 11.6–15.2)

## 2015-10-25 LAB — ABO/RH: ABO/RH(D): A POS

## 2015-10-25 LAB — SURGICAL PCR SCREEN
MRSA, PCR: NEGATIVE
Staphylococcus aureus: NEGATIVE

## 2015-10-30 ENCOUNTER — Ambulatory Visit (INDEPENDENT_AMBULATORY_CARE_PROVIDER_SITE_OTHER): Payer: Medicare Other | Admitting: Internal Medicine

## 2015-10-30 ENCOUNTER — Encounter: Payer: Self-pay | Admitting: Internal Medicine

## 2015-10-30 VITALS — BP 132/76 | HR 84 | Temp 97.5°F | Resp 16 | Ht 69.0 in | Wt 177.8 lb

## 2015-10-30 DIAGNOSIS — E559 Vitamin D deficiency, unspecified: Secondary | ICD-10-CM | POA: Diagnosis not present

## 2015-10-30 DIAGNOSIS — E782 Mixed hyperlipidemia: Secondary | ICD-10-CM | POA: Diagnosis not present

## 2015-10-30 DIAGNOSIS — R7303 Prediabetes: Secondary | ICD-10-CM | POA: Diagnosis not present

## 2015-10-30 DIAGNOSIS — Z79899 Other long term (current) drug therapy: Secondary | ICD-10-CM

## 2015-10-30 DIAGNOSIS — I10 Essential (primary) hypertension: Secondary | ICD-10-CM

## 2015-10-30 NOTE — Progress Notes (Signed)
Patient ID: Carlos Baldwin, male   DOB: 29-Dec-1934, 81 y.o.   MRN: [id number]     This very nice 81 y.o. MWM presents for preop evaluation anticipating a L TKR. Patient has been followed in the past for Hypertension, Hyperlipidemia, Pre-Diabetes and Vitamin D Deficiency. 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      Patient is treated for HTN circa 2003 & BP has been controlled at home. Today's BP: 132/76 mmHg. Patient has had no complaints of any cardiac type chest pain, palpitations, dyspnea/orthopnea/PND, dizziness, claudication, or dependent edema. Patient likewise denies any difficulty with recent respiratory infections.      Hyperlipidemia is controlled with diet. Patient has been tolerant to statins in the past. Last Lipids were  Cholesterol 150; HDL 60; LDL 80; Triglycerides 52 on 12/24/2014.     Also, the patient has history of PreDiabetes predating since 2011 with A1c 6.1% and he has had no symptoms of reactive hypoglycemia, diabetic polys, paresthesias or visual blurring.  Last A1c was  6.3% on 12/24/2014.     Further, the patient also has history of Vitamin D Deficiency of "35" in 2008 and supplements vitamin D without any suspected side-effects. Last vitamin D was 57 on  12/24/2014.    Medication Sig  . VITAMIN D 1000 UNITS  Take 4,000 Units by mouth daily.   Marland Kitchen VITAMIN B-12 PO Take 1 tablet by mouth daily.  . finasteride  5 MG  take 1 tablet by mouth once daily for prostate  . losartan   50 MG  Take 50 mg by mouth daily.  . meloxicam 15 MG  take 1 tablet by mouth once daily (Patient not taking: Reported on 10/18/2015)  . Omega-3 FISH OIL 1000 MG  Take 1,000 mg by mouth daily.  Carren Rang OP Apply 1 drop to eye 2 (two) times daily as needed (Dry eyes).  . tamsulosin  0.4 MG  take 1 capsule by mouth once daily for prostate  . vitamin C 500 MG  Take 500 mg by mouth 2 (two) times daily.   . vitamin E 400 UNIT  Take 400 Units by mouth 2 (two) times daily.   Allergies  Allergen Reactions  . Atenolol Other (See Comments)    "not sure"  . Pravastatin     Joint pain  . Simvastatin     Joint pain  . Sulfonamide Derivatives Swelling and Rash   PMHx:   Past Medical History  Diagnosis Date  . Hyperlipidemia   . Hypertension   . Arthritis     oa  . Asthma     as child  . Measles     as child  . Mumps     as child   Immunization History  Administered Date(s) Administered  . Pneumococcal Conjugate-13 06/27/2014  . Pneumococcal Polysaccharide-23 09/07/2006  . Zoster 09/07/2008   Past Surgical History  Procedure Laterality Date  . Knee surgery Right 1972    cartlidge repair  . Left knee arthroscopy  01-14-2012   FHx:    Reviewed / unchanged  SHx:    Reviewed / unchanged  Systems Review:  Constitutional: Denies fever, chills, wt changes, headaches, insomnia, fatigue, night sweats, change in appetite. Eyes: Denies redness, blurred vision, diplopia, discharge, itchy, watery eyes.  ENT: Denies discharge, congestion, post nasal drip, epistaxis, sore throat, earache, hearing loss, dental pain, tinnitus, vertigo, sinus pain,  snoring.  CV: Denies chest pain, palpitations, irregular heartbeat, syncope, dyspnea, diaphoresis, orthopnea, PND, claudication or edema. Respiratory: denies cough, dyspnea, DOE, pleurisy, hoarseness, laryngitis, wheezing.  Gastrointestinal: Denies dysphagia, odynophagia, heartburn, reflux, water brash, abdominal pain or cramps, nausea, vomiting, bloating, diarrhea, constipation, hematemesis, melena, hematochezia  or hemorrhoids. Genitourinary: Denies dysuria, frequency, urgency, nocturia, hesitancy, discharge, hematuria or flank pain. Musculoskeletal: c//o pains bilat knees -  L>R. Skin: Denies pruritus, rash, hives, warts, acne, eczema or change in skin lesion(s). Neuro: No weakness, tremor, incoordination, spasms, paresthesia or  pain. Psychiatric: Denies confusion, memory loss or sensory loss. Endo: Denies change in weight, skin or hair change.  Heme/Lymph: No excessive bleeding, bruising or enlarged lymph nodes.  Physical Exam  BP 132/76 mmHg  Pulse 84  Temp(Src) 97.5 F (36.4 C)  Resp 16  Ht 5\' 9"  (1.753 m)  Wt 177 lb 12.8 oz (80.65 kg)  BMI 26.24 kg/m2  Appears well nourished and in no distress. Eyes: PERRLA, EOMs, conjunctiva no swelling or erythema. Sinuses: No frontal/maxillary tenderness ENT/Mouth: EAC's clear, TM's nl w/o erythema, bulging. Nares clear w/o erythema, swelling, exudates. Oropharynx clear without erythema or exudates. Oral hygiene is good. Tongue normal, non obstructing. Hearing intact.  Neck: Supple. Thyroid nl. Car 2+/2+ without bruits, nodes or JVD. Chest: Respirations nl with BS clear & equal w/o rales, rhonchi, wheezing or stridor.  Cor: Heart sounds normal w/ regular rate and rhythm without sig. murmurs, gallops, clicks, or rubs. Peripheral pulses normal and equal  without edema.  Abdomen: Soft & bowel sounds normal. Non-tender w/o guarding, rebound, hernias, masses, or organomegaly.  Lymphatics: Unremarkable.  Musculoskeletal: Full ROM all peripheral extremities, joint stability, 5/5 strength & limping gait favoring the LLE.  Skin: Warm, dry without exposed rashes, lesions or ecchymosis apparent.  Neuro: Cranial nerves intact, reflexes equal bilaterally. Sensory-motor testing grossly intact. Tendon reflexes grossly intact.  Pysch: Alert & oriented x 3.  Insight and judgement nl & appropriate. No ideations.  Assessment and Plan:  1. Essential hypertension  - EKG 12-Lead - WNL  2. Hyperlipidemia   3. Prediabetes   4. Vitamin D deficiency   5. Medication management   Recommended regular exercise, BP monitoring, weight control, and discussed med and SE's. Recommended labs to assess and monitor clinical status. Further disposition pending results of labs. Over 30 minutes  of exam, counseling, chart review was performed

## 2015-10-30 NOTE — Patient Instructions (Signed)

## 2015-11-04 ENCOUNTER — Inpatient Hospital Stay (HOSPITAL_COMMUNITY)
Admission: AD | Admit: 2015-11-04 | Discharge: 2015-11-06 | DRG: 470 | Disposition: A | Discharge status: Home / Self Care | Payer: Medicare Other | Source: Ambulatory Visit | Attending: Orthopedic Surgery | Admitting: Orthopedic Surgery

## 2015-11-04 ENCOUNTER — Encounter (HOSPITAL_COMMUNITY)
Admission: AD | Disposition: A | Discharge status: Home / Self Care | Payer: Self-pay | Source: Ambulatory Visit | Attending: Orthopedic Surgery

## 2015-11-04 ENCOUNTER — Encounter (HOSPITAL_COMMUNITY): Payer: Self-pay | Admitting: *Deleted

## 2015-11-04 ENCOUNTER — Inpatient Hospital Stay (HOSPITAL_COMMUNITY): Payer: Medicare Other | Admitting: Registered Nurse

## 2015-11-04 DIAGNOSIS — M1712 Unilateral primary osteoarthritis, left knee: Secondary | ICD-10-CM | POA: Diagnosis not present

## 2015-11-04 DIAGNOSIS — Z01812 Encounter for preprocedural laboratory examination: Secondary | ICD-10-CM | POA: Diagnosis not present

## 2015-11-04 DIAGNOSIS — M25562 Pain in left knee: Secondary | ICD-10-CM | POA: Diagnosis present

## 2015-11-04 DIAGNOSIS — Z79899 Other long term (current) drug therapy: Secondary | ICD-10-CM

## 2015-11-04 DIAGNOSIS — I10 Essential (primary) hypertension: Secondary | ICD-10-CM | POA: Diagnosis present

## 2015-11-04 DIAGNOSIS — E785 Hyperlipidemia, unspecified: Secondary | ICD-10-CM | POA: Diagnosis not present

## 2015-11-04 DIAGNOSIS — M170 Bilateral primary osteoarthritis of knee: Principal | ICD-10-CM | POA: Diagnosis present

## 2015-11-04 DIAGNOSIS — M179 Osteoarthritis of knee, unspecified: Secondary | ICD-10-CM | POA: Diagnosis present

## 2015-11-04 DIAGNOSIS — M1710 Unilateral primary osteoarthritis, unspecified knee: Secondary | ICD-10-CM | POA: Diagnosis present

## 2015-11-04 HISTORY — PX: TOTAL KNEE ARTHROPLASTY: SHX125

## 2015-11-04 LAB — TYPE AND SCREEN
ABO/RH(D): A POS
ANTIBODY SCREEN: NEGATIVE

## 2015-11-04 SURGERY — ARTHROPLASTY, KNEE, TOTAL
Anesthesia: Spinal | Site: Knee | Laterality: Left

## 2015-11-04 MED ORDER — DOCUSATE SODIUM 100 MG PO CAPS
100.0000 mg | ORAL_CAPSULE | Freq: Two times a day (BID) | ORAL | Status: DC
  Administered 2015-11-04 – 2015-11-06 (×4): 100 mg via ORAL

## 2015-11-04 MED ORDER — OXYCODONE HCL 5 MG PO TABS
5.0000 mg | ORAL_TABLET | ORAL | Status: DC | PRN
  Administered 2015-11-04 – 2015-11-06 (×13): 10 mg via ORAL
  Filled 2015-11-04 (×13): qty 2

## 2015-11-04 MED ORDER — RIVAROXABAN 10 MG PO TABS
10.0000 mg | ORAL_TABLET | Freq: Every day | ORAL | Status: DC
  Administered 2015-11-05 – 2015-11-06 (×2): 10 mg via ORAL
  Filled 2015-11-04 (×3): qty 1

## 2015-11-04 MED ORDER — METHOCARBAMOL 500 MG PO TABS
500.0000 mg | ORAL_TABLET | Freq: Four times a day (QID) | ORAL | Status: DC | PRN
Start: 2015-11-04 — End: 2015-11-06
  Administered 2015-11-05 – 2015-11-06 (×2): 500 mg via ORAL
  Filled 2015-11-04 (×2): qty 1

## 2015-11-04 MED ORDER — LIDOCAINE HCL (CARDIAC) 20 MG/ML IV SOLN
INTRAVENOUS | Status: AC
  Filled 2015-11-04: qty 5

## 2015-11-04 MED ORDER — CEFAZOLIN SODIUM-DEXTROSE 2-3 GM-% IV SOLR
2.0000 g | INTRAVENOUS | Status: AC
  Administered 2015-11-04: 2 g via INTRAVENOUS

## 2015-11-04 MED ORDER — BUPIVACAINE LIPOSOME 1.3 % IJ SUSP
INTRAMUSCULAR | Status: DC | PRN
  Administered 2015-11-04: 20 mL

## 2015-11-04 MED ORDER — CEFAZOLIN SODIUM-DEXTROSE 2-3 GM-% IV SOLR
2.0000 g | Freq: Four times a day (QID) | INTRAVENOUS | Status: AC
  Administered 2015-11-04 (×2): 2 g via INTRAVENOUS
  Filled 2015-11-04 (×2): qty 50

## 2015-11-04 MED ORDER — MIDAZOLAM HCL 2 MG/2ML IJ SOLN
INTRAMUSCULAR | Status: AC
  Filled 2015-11-04: qty 2

## 2015-11-04 MED ORDER — DEXAMETHASONE SODIUM PHOSPHATE 10 MG/ML IJ SOLN
INTRAMUSCULAR | Status: AC
  Filled 2015-11-04: qty 1

## 2015-11-04 MED ORDER — TAMSULOSIN HCL 0.4 MG PO CAPS
0.4000 mg | ORAL_CAPSULE | Freq: Every day | ORAL | Status: DC
  Administered 2015-11-04 – 2015-11-05 (×2): 0.4 mg via ORAL
  Filled 2015-11-04 (×3): qty 1

## 2015-11-04 MED ORDER — PROPOFOL 500 MG/50ML IV EMUL
INTRAVENOUS | Status: DC | PRN
  Administered 2015-11-04: 25 ug/kg/min via INTRAVENOUS

## 2015-11-04 MED ORDER — SODIUM CHLORIDE 0.9 % IR SOLN
Status: DC | PRN
  Administered 2015-11-04 (×2): 1000 mL

## 2015-11-04 MED ORDER — TRANEXAMIC ACID 1000 MG/10ML IV SOLN
1000.0000 mg | INTRAVENOUS | Status: AC
  Administered 2015-11-04: 1000 mg via INTRAVENOUS
  Filled 2015-11-04: qty 10

## 2015-11-04 MED ORDER — BUPIVACAINE LIPOSOME 1.3 % IJ SUSP
20.0000 mL | Freq: Once | INTRAMUSCULAR | Status: DC
  Filled 2015-11-04: qty 20

## 2015-11-04 MED ORDER — LACTATED RINGERS IV SOLN
INTRAVENOUS | Status: DC
  Administered 2015-11-04: 1000 mL via INTRAVENOUS

## 2015-11-04 MED ORDER — METOCLOPRAMIDE HCL 5 MG/ML IJ SOLN: 5.0000 mg | Freq: Three times a day (TID) | INTRAMUSCULAR | Status: DC | PRN

## 2015-11-04 MED ORDER — FINASTERIDE 5 MG PO TABS
5.0000 mg | ORAL_TABLET | Freq: Every day | ORAL | Status: DC
  Administered 2015-11-04 – 2015-11-06 (×3): 5 mg via ORAL
  Filled 2015-11-04 (×3): qty 1

## 2015-11-04 MED ORDER — FLEET ENEMA 7-19 GM/118ML RE ENEM: 1.0000 | ENEMA | Freq: Once | RECTAL | Status: DC | PRN

## 2015-11-04 MED ORDER — ACETAMINOPHEN 10 MG/ML IV SOLN
1000.0000 mg | Freq: Once | INTRAVENOUS | Status: AC
  Administered 2015-11-04: 1000 mg via INTRAVENOUS
  Filled 2015-11-04: qty 100

## 2015-11-04 MED ORDER — DEXAMETHASONE SODIUM PHOSPHATE 10 MG/ML IJ SOLN
10.0000 mg | Freq: Once | INTRAMUSCULAR | Status: AC
  Administered 2015-11-04: 10 mg via INTRAVENOUS

## 2015-11-04 MED ORDER — ACETAMINOPHEN 500 MG PO TABS
1000.0000 mg | ORAL_TABLET | Freq: Four times a day (QID) | ORAL | Status: AC
  Administered 2015-11-04 – 2015-11-05 (×4): 1000 mg via ORAL
  Filled 2015-11-04 (×4): qty 2

## 2015-11-04 MED ORDER — SODIUM CHLORIDE 0.9 % IJ SOLN
INTRAMUSCULAR | Status: AC
  Filled 2015-11-04: qty 50

## 2015-11-04 MED ORDER — METOCLOPRAMIDE HCL 10 MG PO TABS: 5.0000 mg | ORAL_TABLET | Freq: Three times a day (TID) | ORAL | Status: DC | PRN

## 2015-11-04 MED ORDER — SODIUM CHLORIDE 0.9 % IV SOLN
INTRAVENOUS | Status: DC
  Administered 2015-11-04 (×2): via INTRAVENOUS

## 2015-11-04 MED ORDER — BUPIVACAINE IN DEXTROSE 0.75-8.25 % IT SOLN
INTRATHECAL | Status: DC | PRN
  Administered 2015-11-04: 1.8 mL via INTRATHECAL

## 2015-11-04 MED ORDER — ONDANSETRON HCL 4 MG/2ML IJ SOLN
INTRAMUSCULAR | Status: DC | PRN
  Administered 2015-11-04: 4 mg via INTRAVENOUS

## 2015-11-04 MED ORDER — MORPHINE SULFATE (PF) 2 MG/ML IV SOLN
1.0000 mg | INTRAVENOUS | Status: DC | PRN
  Administered 2015-11-04: 1 mg via INTRAVENOUS
  Filled 2015-11-04: qty 1

## 2015-11-04 MED ORDER — FENTANYL CITRATE (PF) 100 MCG/2ML IJ SOLN
INTRAMUSCULAR | Status: DC | PRN
  Administered 2015-11-04: 50 ug via INTRAVENOUS

## 2015-11-04 MED ORDER — BUPIVACAINE HCL 0.25 % IJ SOLN
INTRAMUSCULAR | Status: DC | PRN
  Administered 2015-11-04: 20 mL

## 2015-11-04 MED ORDER — PROPOFOL 10 MG/ML IV BOLUS
INTRAVENOUS | Status: AC
  Filled 2015-11-04: qty 40

## 2015-11-04 MED ORDER — METHOCARBAMOL 1000 MG/10ML IJ SOLN
500.0000 mg | Freq: Four times a day (QID) | INTRAVENOUS | Status: DC | PRN
  Filled 2015-11-04: qty 5

## 2015-11-04 MED ORDER — ONDANSETRON HCL 4 MG/2ML IJ SOLN
INTRAMUSCULAR | Status: AC
  Filled 2015-11-04: qty 2

## 2015-11-04 MED ORDER — MIDAZOLAM HCL 5 MG/5ML IJ SOLN
INTRAMUSCULAR | Status: DC | PRN
  Administered 2015-11-04 (×2): 1 mg via INTRAVENOUS

## 2015-11-04 MED ORDER — POLYETHYLENE GLYCOL 3350 17 G PO PACK: 17.0000 g | PACK | Freq: Every day | ORAL | Status: DC | PRN

## 2015-11-04 MED ORDER — ONDANSETRON HCL 4 MG PO TABS
4.0000 mg | ORAL_TABLET | Freq: Four times a day (QID) | ORAL | Status: DC | PRN
  Administered 2015-11-05: 4 mg via ORAL
  Filled 2015-11-04: qty 1

## 2015-11-04 MED ORDER — CHLORHEXIDINE GLUCONATE 4 % EX LIQD: 60.0000 mL | Freq: Once | CUTANEOUS | Status: DC

## 2015-11-04 MED ORDER — TRANEXAMIC ACID 1000 MG/10ML IV SOLN
1000.0000 mg | Freq: Once | INTRAVENOUS | Status: AC
  Administered 2015-11-04: 1000 mg via INTRAVENOUS
  Filled 2015-11-04: qty 10

## 2015-11-04 MED ORDER — ACETAMINOPHEN 325 MG PO TABS: 650.0000 mg | ORAL_TABLET | Freq: Four times a day (QID) | ORAL | Status: DC | PRN

## 2015-11-04 MED ORDER — SUGAMMADEX SODIUM 200 MG/2ML IV SOLN
INTRAVENOUS | Status: AC
  Filled 2015-11-04: qty 2

## 2015-11-04 MED ORDER — ACETAMINOPHEN 650 MG RE SUPP: 650.0000 mg | Freq: Four times a day (QID) | RECTAL | Status: DC | PRN

## 2015-11-04 MED ORDER — LIDOCAINE HCL (CARDIAC) 20 MG/ML IV SOLN
INTRAVENOUS | Status: DC | PRN
  Administered 2015-11-04: 100 mg via INTRAVENOUS

## 2015-11-04 MED ORDER — MENTHOL 3 MG MT LOZG: 1.0000 | LOZENGE | OROMUCOSAL | Status: DC | PRN

## 2015-11-04 MED ORDER — SODIUM CHLORIDE 0.9 % IV SOLN: INTRAVENOUS | Status: DC

## 2015-11-04 MED ORDER — DIPHENHYDRAMINE HCL 12.5 MG/5ML PO ELIX: 12.5000 mg | ORAL_SOLUTION | ORAL | Status: DC | PRN

## 2015-11-04 MED ORDER — CEFAZOLIN SODIUM-DEXTROSE 2-3 GM-% IV SOLR
INTRAVENOUS | Status: AC
  Filled 2015-11-04: qty 50

## 2015-11-04 MED ORDER — PHENOL 1.4 % MT LIQD
1.0000 | OROMUCOSAL | Status: DC | PRN
  Filled 2015-11-04: qty 177

## 2015-11-04 MED ORDER — SODIUM CHLORIDE 0.9 % IJ SOLN
INTRAMUSCULAR | Status: DC | PRN
  Administered 2015-11-04: 30 mL

## 2015-11-04 MED ORDER — BISACODYL 10 MG RE SUPP: 10.0000 mg | Freq: Every day | RECTAL | Status: DC | PRN

## 2015-11-04 MED ORDER — ACETAMINOPHEN 10 MG/ML IV SOLN
INTRAVENOUS | Status: AC
  Filled 2015-11-04: qty 100

## 2015-11-04 MED ORDER — FENTANYL CITRATE (PF) 100 MCG/2ML IJ SOLN
INTRAMUSCULAR | Status: AC
  Filled 2015-11-04: qty 2

## 2015-11-04 MED ORDER — BUPIVACAINE HCL (PF) 0.25 % IJ SOLN
INTRAMUSCULAR | Status: AC
  Filled 2015-11-04: qty 30

## 2015-11-04 MED ORDER — DEXAMETHASONE SODIUM PHOSPHATE 10 MG/ML IJ SOLN
10.0000 mg | Freq: Once | INTRAMUSCULAR | Status: DC
  Filled 2015-11-04: qty 1

## 2015-11-04 MED ORDER — ONDANSETRON HCL 4 MG/2ML IJ SOLN
4.0000 mg | Freq: Four times a day (QID) | INTRAMUSCULAR | Status: DC | PRN
Start: 2015-11-04 — End: 2015-11-06

## 2015-11-04 SURGICAL SUPPLY — 53 items
BAG DECANTER FOR FLEXI CONT (MISCELLANEOUS) ×3 IMPLANT
BAG SPEC THK2 15X12 ZIP CLS (MISCELLANEOUS) ×1
BAG ZIPLOCK 12X15 (MISCELLANEOUS) ×3 IMPLANT
BANDAGE ACE 6X5 VEL STRL LF (GAUZE/BANDAGES/DRESSINGS) ×1 IMPLANT
BANDAGE ELASTIC 6 VELCRO ST LF (GAUZE/BANDAGES/DRESSINGS) ×2 IMPLANT
BLADE SAG 18X100X1.27 (BLADE) ×3 IMPLANT
BLADE SAW SGTL 11.0X1.19X90.0M (BLADE) ×3 IMPLANT
BOWL SMART MIX CTS (DISPOSABLE) ×3 IMPLANT
CAP KNEE TOTAL 3 SIGMA ×2 IMPLANT
CEMENT HV SMART SET (Cement) ×6 IMPLANT
CLOSURE WOUND 1/2 X4 (GAUZE/BANDAGES/DRESSINGS) ×1
CLOTH BEACON ORANGE TIMEOUT ST (SAFETY) ×3 IMPLANT
CUFF TOURN SGL QUICK 34 (TOURNIQUET CUFF) ×3
CUFF TRNQT CYL 34X4X40X1 (TOURNIQUET CUFF) ×1 IMPLANT
DECANTER SPIKE VIAL GLASS SM (MISCELLANEOUS) ×3 IMPLANT
DRAPE U-SHAPE 47X51 STRL (DRAPES) ×3 IMPLANT
DRSG ADAPTIC 3X8 NADH LF (GAUZE/BANDAGES/DRESSINGS) ×3 IMPLANT
DRSG PAD ABDOMINAL 8X10 ST (GAUZE/BANDAGES/DRESSINGS) ×1 IMPLANT
DURAPREP 26ML APPLICATOR (WOUND CARE) ×3 IMPLANT
ELECT REM PT RETURN 9FT ADLT (ELECTROSURGICAL) ×3
ELECTRODE REM PT RTRN 9FT ADLT (ELECTROSURGICAL) ×1 IMPLANT
EVACUATOR 1/8 PVC DRAIN (DRAIN) ×3 IMPLANT
GAUZE SPONGE 4X4 12PLY STRL (GAUZE/BANDAGES/DRESSINGS) ×3 IMPLANT
GLOVE BIO SURGEON STRL SZ7.5 (GLOVE) IMPLANT
GLOVE BIO SURGEON STRL SZ8 (GLOVE) ×3 IMPLANT
GLOVE BIOGEL PI IND STRL 6.5 (GLOVE) IMPLANT
GLOVE BIOGEL PI IND STRL 8 (GLOVE) ×1 IMPLANT
GLOVE BIOGEL PI INDICATOR 6.5 (GLOVE)
GLOVE BIOGEL PI INDICATOR 8 (GLOVE) ×2
GLOVE SURG SS PI 6.5 STRL IVOR (GLOVE) IMPLANT
GOWN STRL REUS W/TWL LRG LVL3 (GOWN DISPOSABLE) ×3 IMPLANT
GOWN STRL REUS W/TWL XL LVL3 (GOWN DISPOSABLE) IMPLANT
HANDPIECE INTERPULSE COAX TIP (DISPOSABLE) ×3
IMMOBILIZER KNEE 20 (SOFTGOODS) ×3
IMMOBILIZER KNEE 20 THIGH 36 (SOFTGOODS) ×1 IMPLANT
MANIFOLD NEPTUNE II (INSTRUMENTS) ×3 IMPLANT
NS IRRIG 1000ML POUR BTL (IV SOLUTION) ×3 IMPLANT
PACK TOTAL KNEE CUSTOM (KITS) ×3 IMPLANT
PAD ABD 8X10 STRL (GAUZE/BANDAGES/DRESSINGS) ×2 IMPLANT
PADDING CAST COTTON 6X4 STRL (CAST SUPPLIES) ×5 IMPLANT
POSITIONER SURGICAL ARM (MISCELLANEOUS) ×3 IMPLANT
SET HNDPC FAN SPRY TIP SCT (DISPOSABLE) ×1 IMPLANT
STRIP CLOSURE SKIN 1/2X4 (GAUZE/BANDAGES/DRESSINGS) ×3 IMPLANT
SUT MNCRL AB 4-0 PS2 18 (SUTURE) ×3 IMPLANT
SUT VIC AB 2-0 CT1 27 (SUTURE) ×9
SUT VIC AB 2-0 CT1 TAPERPNT 27 (SUTURE) ×3 IMPLANT
SUT VLOC 180 0 24IN GS25 (SUTURE) ×3 IMPLANT
SYR 50ML LL SCALE MARK (SYRINGE) ×3 IMPLANT
TRAY FOLEY W/METER SILVER 14FR (SET/KITS/TRAYS/PACK) ×1 IMPLANT
TRAY FOLEY W/METER SILVER 16FR (SET/KITS/TRAYS/PACK) ×3 IMPLANT
WATER STERILE IRR 1500ML POUR (IV SOLUTION) ×3 IMPLANT
WRAP KNEE MAXI GEL POST OP (GAUZE/BANDAGES/DRESSINGS) ×3 IMPLANT
YANKAUER SUCT BULB TIP 10FT TU (MISCELLANEOUS) ×3 IMPLANT

## 2015-11-04 NOTE — Anesthesia Postprocedure Evaluation (Signed)
Anesthesia Post Note  Patient: Carlos Baldwin  Procedure(s) Performed: Procedure(s) (LRB): LEFT TOTAL KNEE ARTHROPLASTY (Left)  Patient location during evaluation: PACU Anesthesia Type: Spinal Level of consciousness: awake and alert Pain management: pain level controlled Vital Signs Assessment: post-procedure vital signs reviewed and stable Respiratory status: spontaneous breathing, nonlabored ventilation, respiratory function stable and patient connected to nasal cannula oxygen Cardiovascular status: blood pressure returned to baseline and stable Postop Assessment: no signs of nausea or vomiting Anesthetic complications: no    Last Vitals:  Filed Vitals:   11/04/15 1157 11/04/15 1305  BP: 147/78 146/78  Pulse:  77  Temp: 36.6 C 36.3 C  Resp: 12 16    Last Pain:  Filed Vitals:   11/04/15 1323  PainSc: 2                  Sidnie Swalley L

## 2015-11-04 NOTE — Evaluation (Addendum)
Physical Therapy Evaluation Patient Details Name: Carlos Baldwin MRN: [id number] DOB: [birth year] Today's Date: 11/04/2015   History of Present Illness  LTKA  Clinical Impression  Patient tolerated very well. Reports he is slated to have Virtual Therapy at [facility]. Patient will benefit from PT to address problems listed in the note below.   Follow Up Recommendations  (VIRTUAL THERAPY)    Equipment Recommendations  RW  Recommendations for Other Services       Precautions / Restrictions Precautions Precautions: Knee Required Braces or Orthoses: Knee Immobilizer - Left Knee Immobilizer - Left: Discontinue once straight leg raise with < 10 degree lag      Mobility  Bed Mobility Overal bed mobility: Needs Assistance Bed Mobility: Supine to Sit     Supine to sit: Min assist     General bed mobility comments: support R leg  Transfers Overall transfer level: Needs assistance Equipment used: Rolling walker (2 wheeled) Transfers: Sit to/from Stand Sit to Stand: Min assist         General transfer comment: cues for hand and R leg position  Ambulation/Gait Ambulation/Gait assistance: Min assist Ambulation Distance (Feet): 70 Feet Assistive device: Rolling walker (2 wheeled) Gait Pattern/deviations: Step-to pattern;Step-through pattern     General Gait Details: cues for sequence  Stairs            Wheelchair Mobility    Modified Rankin (Stroke Patients Only)       Balance                                             Pertinent Vitals/Pain Pain Assessment: 0-10 Pain Score: 4  Pain Location: R knee Pain Descriptors / Indicators: Discomfort Pain Intervention(s): Limited activity within patient's tolerance;Monitored during session;Premedicated before session;Ice applied;Repositioned    Home Living Family/patient expects to be discharged to:: Private residence Living Arrangements: Spouse/significant other Available Help at Discharge:  Family Type of Home: House Home Access: Stairs to enter Entrance Stairs-Rails: None Entrance Stairs-Number of Steps: 3 Home Layout: Two level;Able to live on main level with bedroom/bathroom Home Equipment: Grab bars - tub/shower;Crutches      Prior Function Level of Independence: Independent         Comments: bicycle Engineer, structural.     Hand Dominance        Extremity/Trunk Assessment               Lower Extremity Assessment: RLE deficits/detail RLE Deficits / Details: able to perform SLR    Cervical / Trunk Assessment: Normal  Communication   Communication: No difficulties  Cognition Arousal/Alertness: Awake/alert Behavior During Therapy: WFL for tasks assessed/performed Overall Cognitive Status: Within Functional Limits for tasks assessed                      General Comments      Exercises        Assessment/Plan    PT Assessment Patient needs continued PT services  PT Diagnosis Difficulty walking;Acute pain   PT Problem List Decreased strength;Decreased range of motion;Decreased activity tolerance;Decreased mobility;Decreased knowledge of use of DME;Decreased safety awareness;Decreased knowledge of precautions;Pain  PT Treatment Interventions DME instruction;Gait training;Stair training;Functional mobility training;Therapeutic activities;Therapeutic exercise;Patient/family education   PT Goals (Current goals can be found in the Care Plan section) Acute Rehab PT Goals Patient Stated Goal: to ride my bike PT Goal Formulation: With patient/family  Time For Goal Achievement: 11/08/15 Potential to Achieve Goals: Good    Frequency 7X/week   Barriers to discharge        Co-evaluation               End of Session Equipment Utilized During Treatment: Right knee immobilizer Activity Tolerance: Patient tolerated treatment well Patient left: in chair;with call bell/phone within reach;with family/visitor present Nurse Communication:  Mobility status         Time: [id number] PT Time Calculation (min) (ACUTE ONLY): 25 min   Charges:   PT Evaluation $PT Eval Low Complexity: 1 Procedure PT Treatments $Gait Training: 8-22 mins   PT G Codes:        [name] 11/04/2015, 5:58 PM  Tresa Endo PT [phone]

## 2015-11-04 NOTE — Anesthesia Preprocedure Evaluation (Addendum)
Anesthesia Evaluation  Patient identified by MRN, date of birth, ID band Patient awake    Reviewed: Allergy & Precautions, H&P , NPO status , Patient's Chart, lab work & pertinent test results  Airway Mallampati: II  TM Distance: >3 FB Neck ROM: full    Dental no notable dental hx. (+) Dental Advisory Given, Teeth Intact   Pulmonary neg pulmonary ROS,    Pulmonary exam normal breath sounds clear to auscultation       Cardiovascular Exercise Tolerance: Good hypertension, Pt. on medications negative cardio ROS Normal cardiovascular exam Rhythm:regular Rate:Normal     Neuro/Psych negative neurological ROS  negative psych ROS   GI/Hepatic negative GI ROS, Neg liver ROS,   Endo/Other  negative endocrine ROSprediabetes  Renal/GU negative Renal ROS  negative genitourinary   Musculoskeletal   Abdominal   Peds  Hematology negative hematology ROS (+)   Anesthesia Other Findings   Reproductive/Obstetrics negative OB ROS                            Anesthesia Physical Anesthesia Plan  ASA: II  Anesthesia Plan: Spinal   Post-op Pain Management:    Induction:   Airway Management Planned: Natural Airway and Simple Face Mask  Additional Equipment:   Intra-op Plan:   Post-operative Plan:   Informed Consent: I have reviewed the patients History and Physical, chart, labs and discussed the procedure including the risks, benefits and alternatives for the proposed anesthesia with the patient or authorized representative who has indicated his/her understanding and acceptance.   Dental Advisory Given  Plan Discussed with: CRNA and Surgeon  Anesthesia Plan Comments:        Anesthesia Quick Evaluation

## 2015-11-04 NOTE — Op Note (Signed)
Pre-operative diagnosis- Osteoarthritis  Left knee(s)  Post-operative diagnosis- Osteoarthritis Left knee(s)  Procedure-  Left  Total Knee Arthroplasty  Surgeon- Dione Plover. Tiamarie Furnari, MD  Assistant- Ardeen Jourdain, PA-C   Anesthesia-  Spinal  EBL-* No blood loss amount entered *   Drains Hemovac  Tourniquet time-  Total Tourniquet Time Documented: Thigh (Left) - 32 minutes Total: Thigh (Left) - 32 minutes     Complications- None  Condition-PACU - hemodynamically stable.   Brief Clinical Note   Carlos Baldwin is a 81 y.o. year old male with end stage OA of his left knee with progressively worsening pain and dysfunction. He has constant pain, with activity and at rest and significant functional deficits with difficulties even with ADLs. He has had extensive non-op management including analgesics, injections of cortisone and viscosupplements, and home exercise program, but remains in significant pain with significant dysfunction. Radiographs show bone on bone arthritis medial and patellofemoral. He presents now for left Total Knee Arthroplasty.     Procedure in detail---   The patient is brought into the operating room and positioned supine on the operating table. After successful administration of  Spinal,   a tourniquet is placed high on the  Left thigh(s) and the lower extremity is prepped and draped in the usual sterile fashion. Time out is performed by the operating team and then the  Left lower extremity is wrapped in Esmarch, knee flexed and the tourniquet inflated to 300 mmHg.       A midline incision is made with a ten blade through the subcutaneous tissue to the level of the extensor mechanism. A fresh blade is used to make a medial parapatellar arthrotomy. Soft tissue over the proximal medial tibia is subperiosteally elevated to the joint line with a knife and into the semimembranosus bursa with a Cobb elevator. Soft tissue over the proximal lateral tibia is elevated with  attention being paid to avoiding the patellar tendon on the tibial tubercle. The patella is everted, knee flexed 90 degrees and the ACL and PCL are removed. Findings are bone on bone medial and patellofemoral with large global osteophytes.        The drill is used to create a starting hole in the distal femur and the canal is thoroughly irrigated with sterile saline to remove the fatty contents. The 5 degree Left  valgus alignment guide is placed into the femoral canal and the distal femoral cutting block is pinned to remove 10 mm off the distal femur. Resection is made with an oscillating saw.      The tibia is subluxed forward and the menisci are removed. The extramedullary alignment guide is placed referencing proximally at the medial aspect of the tibial tubercle and distally along the second metatarsal axis and tibial crest. The block is pinned to remove 57mm off the more deficient medial  side. Resection is made with an oscillating saw. Size 3is the most appropriate size for the tibia and the proximal tibia is prepared with the modular drill and keel punch for that size.      The femoral sizing guide is placed and size 4 is most appropriate. Rotation is marked off the epicondylar axis and confirmed by creating a rectangular flexion gap at 90 degrees. The size 4 cutting block is pinned in this rotation and the anterior, posterior and chamfer cuts are made with the oscillating saw. The intercondylar block is then placed and that cut is made.      Trial size 3  tibial component, trial size 4 narrow posterior stabilized femur and a 10  mm posterior stabilized rotating platform insert trial is placed. Full extension is achieved with excellent varus/valgus and anterior/posterior balance throughout full range of motion. The patella is everted and thickness measured to be 24  mm. Free hand resection is taken to 14 mm, a 38 template is placed, lug holes are drilled, trial patella is placed, and it tracks normally.  Osteophytes are removed off the posterior femur with the trial in place. All trials are removed and the cut bone surfaces prepared with pulsatile lavage. Cement is mixed and once ready for implantation, the size 3 tibial implant, size  4 narrow posterior stabilized femoral component, and the size 38 patella are cemented in place and the patella is held with the clamp. The trial insert is placed and the knee held in full extension. The Exparel (20 ml mixed with 30 ml saline) and .25% Bupivicaine, are injected into the extensor mechanism, posterior capsule, medial and lateral gutters and subcutaneous tissues.  All extruded cement is removed and once the cement is hard the permanent 10 mm posterior stabilized rotating platform insert is placed into the tibial tray.      The wound is copiously irrigated with saline solution and the extensor mechanism closed over a hemovac drain with #1 V-loc suture. The tourniquet is released for a total tourniquet time of 32  minutes. Flexion against gravity is 140 degrees and the patella tracks normally. Subcutaneous tissue is closed with 2.0 vicryl and subcuticular with running 4.0 Monocryl. The incision is cleaned and dried and steri-strips and a bulky sterile dressing are applied. The limb is placed into a knee immobilizer and the patient is awakened and transported to recovery in stable condition.      Please note that a surgical assistant was a medical necessity for this procedure in order to perform it in a safe and expeditious manner. Surgical assistant was necessary to retract the ligaments and vital neurovascular structures to prevent injury to them and also necessary for proper positioning of the limb to allow for anatomic placement of the prosthesis.   Dione Plover Carlisha Wisler, MD    11/04/2015, 10:14 AM

## 2015-11-04 NOTE — Anesthesia Procedure Notes (Signed)
Procedure Name: MAC Date/Time: 11/04/2015 8:58 AM Performed by: Carleene Cooper A Pre-anesthesia Checklist: Patient identified, Timeout performed, Emergency Drugs available, Suction available and Patient being monitored Patient Re-evaluated:Patient Re-evaluated prior to inductionOxygen Delivery Method: Simple face mask Dental Injury: Teeth and Oropharynx as per pre-operative assessment

## 2015-11-04 NOTE — Transfer of Care (Signed)
Immediate Anesthesia Transfer of Care Note  Patient: Carlos Baldwin  Procedure(s) Performed: Procedure(s): LEFT TOTAL KNEE ARTHROPLASTY (Left)  Patient Location: PACU  Anesthesia Type:MAC and Spinal  Level of Consciousness: awake, alert , oriented and patient cooperative  Airway & Oxygen Therapy: Patient Spontanous Breathing and Patient connected to face mask oxygen  Post-op Assessment: Report given to RN and Post -op Vital signs reviewed and stable  Post vital signs: Reviewed and stable  Last Vitals:  Filed Vitals:   11/04/15 0651  BP: 150/90  Pulse: 105  Temp: 37.1 C  Resp: 18    Complications: No apparent anesthesia complications

## 2015-11-04 NOTE — Progress Notes (Signed)
Utilization review completed.  

## 2015-11-04 NOTE — Interval H&P Note (Signed)
History and Physical Interval Note:  11/04/2015 7:07 AM  Carlos Baldwin  has presented today for surgery, with the diagnosis of left knee osteoarthritis  The various methods of treatment have been discussed with the patient and family. After consideration of risks, benefits and other options for treatment, the patient has consented to  Procedure(s): LEFT TOTAL KNEE ARTHROPLASTY (Left) as a surgical intervention .  The patient's history has been reviewed, patient examined, no change in status, stable for surgery.  I have reviewed the patient's chart and labs.  Questions were answered to the patient's satisfaction.     Gearlean Alf

## 2015-11-05 LAB — BASIC METABOLIC PANEL
ANION GAP: 5 (ref 5–15)
BUN: 17 mg/dL (ref 6–20)
CHLORIDE: 109 mmol/L (ref 101–111)
CO2: 26 mmol/L (ref 22–32)
Calcium: 8.3 mg/dL — ABNORMAL LOW (ref 8.9–10.3)
Creatinine, Ser: 0.89 mg/dL (ref 0.61–1.24)
GFR calc Af Amer: >60 mL/min (ref >60)
GFR calc non Af Amer: >60 mL/min (ref >60)
GLUCOSE: 134 mg/dL — AB (ref 65–99)
POTASSIUM: 4.2 mmol/L (ref 3.5–5.1)
Sodium: 140 mmol/L (ref 135–145)

## 2015-11-05 LAB — CBC
HCT: 32.5 % — ABNORMAL LOW (ref 39.0–52.0)
HEMOGLOBIN: 10.7 g/dL — AB (ref 13.0–17.0)
MCH: 30 pg (ref 26.0–34.0)
MCHC: 32.9 g/dL (ref 30.0–36.0)
MCV: 91 fL (ref 78.0–100.0)
Platelets: 181 10*3/uL (ref 150–400)
RBC: 3.57 MIL/uL — AB (ref 4.22–5.81)
RDW: 13.5 % (ref 11.5–15.5)
WBC: 13.8 10*3/uL — AB (ref 4.0–10.5)

## 2015-11-05 MED ORDER — METHOCARBAMOL 500 MG PO TABS: 500.0000 mg | ORAL_TABLET | Freq: Four times a day (QID) | ORAL | Status: DC | PRN

## 2015-11-05 MED ORDER — RIVAROXABAN 10 MG PO TABS: 10.0000 mg | ORAL_TABLET | Freq: Every day | ORAL | Status: DC

## 2015-11-05 MED ORDER — OXYCODONE HCL 5 MG PO TABS: 5.0000 mg | ORAL_TABLET | ORAL | Status: DC | PRN

## 2015-11-05 NOTE — Discharge Instructions (Addendum)
° °Dr. Frank Aluisio °Total Joint Specialist °[facility] °[address] °[place], [place] [address] °[phone] ° °TOTAL KNEE REPLACEMENT POSTOPERATIVE DIRECTIONS ° °Knee Rehabilitation, Guidelines Following Surgery  °Results after knee surgery are often greatly improved when you follow the exercise, range of motion and muscle strengthening exercises prescribed by your doctor. Safety measures are also important to protect the knee from further injury. Any time any of these exercises cause you to have increased pain or swelling in your knee joint, decrease the amount until you are comfortable again and slowly increase them. If you have problems or questions, call your caregiver or physical therapist for advice.  ° °HOME CARE INSTRUCTIONS  °Remove items at home which could result in a fall. This includes throw rugs or furniture in walking pathways.  °· ICE to the affected knee every three hours for 30 minutes at a time and then as needed for pain and swelling.  Continue to use ice on the knee for pain and swelling from surgery. You may notice swelling that will progress down to the foot and ankle.  This is normal after surgery.  Elevate the leg when you are not up walking on it.   °· Continue to use the breathing machine which will help keep your temperature down.  It is common for your temperature to cycle up and down following surgery, especially at night when you are not up moving around and exerting yourself.  The breathing machine keeps your lungs expanded and your temperature down. °· Do not place pillow under knee, focus on keeping the knee straight while resting ° °DIET °You may resume your previous home diet once your are discharged from the hospital. ° °DRESSING / WOUND CARE / SHOWERING °You may shower 3 days after surgery, but keep the wounds dry during showering.  You may use an occlusive plastic wrap (Press'n Seal for example), NO SOAKING/SUBMERGING IN THE BATHTUB.  If the  bandage gets wet, change with a clean dry gauze.  If the incision gets wet, pat the wound dry with a clean towel. °You may start showering once you are discharged home but do not submerge the incision under water. Just pat the incision dry and apply a dry gauze dressing on daily. °Change the surgical dressing daily and reapply a dry dressing each time. ° °ACTIVITY °Walk with your walker as instructed. °Use walker as long as suggested by your caregivers. °Avoid periods of inactivity such as sitting longer than an hour when not asleep. This helps prevent blood clots.  °You may resume a sexual relationship in one month or when given the OK by your doctor.  °You may return to work once you are cleared by your doctor.  °Do not drive a car for 6 weeks or until released by you surgeon.  °Do not drive while taking narcotics. ° °WEIGHT BEARING °Weight bearing as tolerated with assist device (walker, cane, etc) as directed, use it as long as suggested by your surgeon or therapist, typically at least 4-6 weeks. ° °POSTOPERATIVE CONSTIPATION PROTOCOL °Constipation - defined medically as fewer than three stools per week and severe constipation as less than one stool per week. ° °One of the most common issues patients have following surgery is constipation.  Even if you have a regular bowel pattern at home, your normal regimen is likely to be disrupted due to multiple reasons following surgery.  Combination of anesthesia, postoperative narcotics, change in appetite and fluid intake all can affect your bowels.    In order to avoid complications following surgery, here are some recommendations in order to help you during your recovery period. ° °Colace (docusate) - Pick up an over-the-counter form of Colace or another stool softener and take twice a day as long as you are requiring postoperative pain medications.  Take with a full glass of water daily.  If you experience loose stools or diarrhea, hold the colace until you stool forms  back up.  If your symptoms do not get better within 1 week or if they get worse, check with your doctor. ° °Dulcolax (bisacodyl) - Pick up over-the-counter and take as directed by the product packaging as needed to assist with the movement of your bowels.  Take with a full glass of water.  Use this product as needed if not relieved by Colace only.  ° °MiraLax (polyethylene glycol) - Pick up over-the-counter to have on hand.  MiraLax is a solution that will increase the amount of water in your bowels to assist with bowel movements.  Take as directed and can mix with a glass of water, juice, soda, coffee, or tea.  Take if you go more than two days without a movement. °Do not use MiraLax more than once per day. Call your doctor if you are still constipated or irregular after using this medication for 7 days in a row. ° °If you continue to have problems with postoperative constipation, please contact the office for further assistance and recommendations.  If you experience "the worst abdominal pain ever" or develop nausea or vomiting, please contact the office immediatly for further recommendations for treatment. ° °ITCHING ° If you experience itching with your medications, try taking only a single pain pill, or even half a pain pill at a time.  You can also use Benadryl over the counter for itching or also to help with sleep.  ° °TED HOSE STOCKINGS °Wear the elastic stockings on both legs for three weeks following surgery during the day but you may remove then at night for sleeping. ° °MEDICATIONS °See your medication summary on the “After Visit Summary” that the nursing staff will review with you prior to discharge.  You may have some home medications which will be placed on hold until you complete the course of blood thinner medication.  It is important for you to complete the blood thinner medication as prescribed by your surgeon.  Continue your approved medications as instructed at time of  discharge. ° °PRECAUTIONS °If you experience chest pain or shortness of breath - call 911 immediately for transfer to the hospital emergency department.  °If you develop a fever greater that 101 F, purulent drainage from wound, increased redness or drainage from wound, foul odor from the wound/dressing, or calf pain - CONTACT YOUR SURGEON.   °                                                °FOLLOW-UP APPOINTMENTS °Make sure you keep all of your appointments after your operation with your surgeon and caregivers. You should call the office at the above phone number and make an appointment for approximately two weeks after the date of your surgery or on the date instructed by your surgeon outlined in the "After Visit Summary". ° ° °RANGE OF MOTION AND STRENGTHENING EXERCISES  °Rehabilitation of the knee is important following a knee injury or   an operation. After just a few days of immobilization, the muscles of the thigh which control the knee become weakened and shrink (atrophy). Knee exercises are designed to build up the tone and strength of the thigh muscles and to improve knee motion. Often times heat used for twenty to thirty minutes before working out will loosen up your tissues and help with improving the range of motion but do not use heat for the first two weeks following surgery. These exercises can be done on a training (exercise) mat, on the floor, on a table or on a bed. Use what ever works the best and is most comfortable for you Knee exercises include:  °Leg Lifts - While your knee is still immobilized in a splint or cast, you can do straight leg raises. Lift the leg to 60 degrees, hold for 3 sec, and slowly lower the leg. Repeat 10-20 times 2-3 times daily. Perform this exercise against resistance later as your knee gets better.  °Quad and Hamstring Sets - Tighten up the muscle on the front of the thigh (Quad) and hold for 5-10 sec. Repeat this 10-20 times hourly. Hamstring sets are done by pushing the  foot backward against an object and holding for 5-10 sec. Repeat as with quad sets.  °· Leg Slides: Lying on your back, slowly slide your foot toward your buttocks, bending your knee up off the floor (only go as far as is comfortable). Then slowly slide your foot back down until your leg is flat on the floor again. °· Angel Wings: Lying on your back spread your legs to the side as far apart as you can without causing discomfort.  °A rehabilitation program following serious knee injuries can speed recovery and prevent re-injury in the future due to weakened muscles. Contact your doctor or a physical therapist for more information on knee rehabilitation.  ° °IF YOU ARE TRANSFERRED TO A SKILLED REHAB FACILITY °If the patient is transferred to a skilled rehab facility following release from the hospital, a list of the current medications will be sent to the facility for the patient to continue.  When discharged from the skilled rehab facility, please have the facility set up the patient's Home Health Physical Therapy prior to being released. Also, the skilled facility will be responsible for providing the patient with their medications at time of release from the facility to include their pain medication, the muscle relaxants, and their blood thinner medication. If the patient is still at the rehab facility at time of the two week follow up appointment, the skilled rehab facility will also need to assist the patient in arranging follow up appointment in our office and any transportation needs. ° °MAKE SURE YOU:  °Understand these instructions.  °Get help right away if you are not doing well or get worse.  ° ° °Pick up stool softner and laxative for home use following surgery while on pain medications. °Do not submerge incision under water. °Please use good hand washing techniques while changing dressing each day. °May shower starting three days after surgery. °Please use a clean towel to pat the incision dry following  showers. °Continue to use ice for pain and swelling after surgery. °Do not use any lotions or creams on the incision until instructed by your surgeon. ° °Take Xarelto for two and a half more weeks, then discontinue Xarelto. °Once the patient has completed the blood thinner regimen, then take a Baby 81 mg Aspirin daily for three more weeks. ° ° °Information   on my medicine - XARELTO® (Rivaroxaban) ° °This medication education was reviewed with me or my healthcare representative as part of my discharge preparation.  The pharmacist that spoke with me during my hospital stay was:  Runyon, Amanda, RPH ° °Why was Xarelto® prescribed for you? °Xarelto® was prescribed for you to reduce the risk of blood clots forming after orthopedic surgery. The medical term for these abnormal blood clots is venous thromboembolism (VTE). ° °What do you need to know about xarelto® ? °Take your Xarelto® ONCE DAILY at the same time every day. °You may take it either with or without food. ° °If you have difficulty swallowing the tablet whole, you may crush it and mix in applesauce just prior to taking your dose. ° °Take Xarelto® exactly as prescribed by your doctor and DO NOT stop taking Xarelto® without talking to the doctor who prescribed the medication.  Stopping without other VTE prevention medication to take the place of Xarelto® may increase your risk of developing a clot. ° °After discharge, you should have regular check-up appointments with your healthcare provider that is prescribing your Xarelto®.   ° °What do you do if you miss a dose? °If you miss a dose, take it as soon as you remember on the same day then continue your regularly scheduled once daily regimen the next day. Do not take two doses of Xarelto® on the same day.  ° °Important Safety Information °A possible side effect of Xarelto® is bleeding. You should call your healthcare provider right away if you experience any of the following: °? Bleeding from an injury or your nose  that does not stop. °? Unusual colored urine (red or dark brown) or unusual colored stools (red or black). °? Unusual bruising for unknown reasons. °? A serious fall or if you hit your head (even if there is no bleeding). ° °Some medicines may interact with Xarelto® and might increase your risk of bleeding while on Xarelto®. To help avoid this, consult your healthcare provider or pharmacist prior to using any new prescription or non-prescription medications, including herbals, vitamins, non-steroidal anti-inflammatory drugs (NSAIDs) and supplements. ° °This website has more information on Xarelto®: [email or web address]. ° ° °

## 2015-11-05 NOTE — Discharge Summary (Signed)
Physician Discharge Summary   Patient ID: Carlos Baldwin MRN: [id number] DOB/AGE: 1935-01-20 81 y.o.  Admit date: 11/04/2015 Discharge date: 11/06/2015  Primary Diagnosis:  Osteoarthritis Left knee(s)  Admission Diagnoses:  Past Medical History  Diagnosis Date  . Hyperlipidemia   . Hypertension   . Arthritis     oa  . Asthma     as child  . Measles     as child  . Mumps     as child   Discharge Diagnoses:   Principal Problem:   OA (osteoarthritis) of knee  Estimated body mass index is 26.27 kg/(m^2) as calculated from the following:   Height as of this encounter: 5' 9" (1.753 m).   Weight as of this encounter: 80.74 kg (178 lb).  Procedure:  Procedure(s) (LRB): LEFT TOTAL KNEE ARTHROPLASTY (Left)   Consults: None  HPI: Carlos Baldwin is a 81 y.o. year old male with end stage OA of his left knee with progressively worsening pain and dysfunction. He has constant pain, with activity and at rest and significant functional deficits with difficulties even with ADLs. He has had extensive non-op management including analgesics, injections of cortisone and viscosupplements, and home exercise program, but remains in significant pain with significant dysfunction. Radiographs show bone on bone arthritis medial and patellofemoral. He presents now for left Total Knee Arthroplasty.   Laboratory Data: Admission on 11/04/2015, Discharged on 11/06/2015  Component Date Value Ref Range Status  . WBC 11/05/2015 13.8* 4.0 - 10.5 K/uL Final  . RBC 11/05/2015 3.57* 4.22 - 5.81 MIL/uL Final  . Hemoglobin 11/05/2015 10.7* 13.0 - 17.0 g/dL Final  . HCT 11/05/2015 32.5* 39.0 - 52.0 % Final  . MCV 11/05/2015 91.0  78.0 - 100.0 fL Final  . MCH 11/05/2015 30.0  26.0 - 34.0 pg Final  . MCHC 11/05/2015 32.9  30.0 - 36.0 g/dL Final  . RDW 11/05/2015 13.5  11.5 - 15.5 % Final  . Platelets 11/05/2015 181  150 - 400 K/uL Final  . Sodium 11/05/2015 140  135 - 145 mmol/L Final  . Potassium  11/05/2015 4.2  3.5 - 5.1 mmol/L Final  . Chloride 11/05/2015 109  101 - 111 mmol/L Final  . CO2 11/05/2015 26  22 - 32 mmol/L Final  . Glucose, Bld 11/05/2015 134* 65 - 99 mg/dL Final  . BUN 11/05/2015 17  6 - 20 mg/dL Final  . Creatinine, Ser 11/05/2015 0.89  0.61 - 1.24 mg/dL Final  . Calcium 11/05/2015 8.3* 8.9 - 10.3 mg/dL Final  . GFR calc non Af Amer 11/05/2015 >60  >60 mL/min Final  . GFR calc Af Amer 11/05/2015 >60  >60 mL/min Final   Comment: (NOTE) The eGFR has been calculated using the CKD EPI equation. This calculation has not been validated in all clinical situations. eGFR's persistently <60 mL/min signify possible Chronic Kidney Disease.   . Anion gap 11/05/2015 5  5 - 15 Final  . WBC 11/06/2015 12.6* 4.0 - 10.5 K/uL Final  . RBC 11/06/2015 3.44* 4.22 - 5.81 MIL/uL Final  . Hemoglobin 11/06/2015 10.3* 13.0 - 17.0 g/dL Final  . HCT 11/06/2015 31.0* 39.0 - 52.0 % Final  . MCV 11/06/2015 90.1  78.0 - 100.0 fL Final  . MCH 11/06/2015 29.9  26.0 - 34.0 pg Final  . MCHC 11/06/2015 33.2  30.0 - 36.0 g/dL Final  . RDW 11/06/2015 13.6  11.5 - 15.5 % Final  . Platelets 11/06/2015 166  150 - 400 K/uL Final  .  Sodium 11/06/2015 134* 135 - 145 mmol/L Final  . Potassium 11/06/2015 3.9  3.5 - 5.1 mmol/L Final  . Chloride 11/06/2015 102  101 - 111 mmol/L Final  . CO2 11/06/2015 26  22 - 32 mmol/L Final  . Glucose, Bld 11/06/2015 130* 65 - 99 mg/dL Final  . BUN 11/06/2015 16  6 - 20 mg/dL Final  . Creatinine, Ser 11/06/2015 0.84  0.61 - 1.24 mg/dL Final  . Calcium 11/06/2015 8.1* 8.9 - 10.3 mg/dL Final  . GFR calc non Af Amer 11/06/2015 >60  >60 mL/min Final  . GFR calc Af Amer 11/06/2015 >60  >60 mL/min Final   Comment: (NOTE) The eGFR has been calculated using the CKD EPI equation. This calculation has not been validated in all clinical situations. eGFR's persistently <60 mL/min signify possible Chronic Kidney Disease.   Georgiann Hahn gap 11/06/2015 6  5 - 15 Final  Hospital  Outpatient Visit on 10/25/2015  Component Date Value Ref Range Status  . aPTT 10/25/2015 28  24 - 37 seconds Final  . WBC 10/25/2015 7.1  4.0 - 10.5 K/uL Final  . RBC 10/25/2015 4.33  4.22 - 5.81 MIL/uL Final  . Hemoglobin 10/25/2015 13.2  13.0 - 17.0 g/dL Final  . HCT 10/25/2015 39.8  39.0 - 52.0 % Final  . MCV 10/25/2015 91.9  78.0 - 100.0 fL Final  . MCH 10/25/2015 30.5  26.0 - 34.0 pg Final  . MCHC 10/25/2015 33.2  30.0 - 36.0 g/dL Final  . RDW 10/25/2015 13.6  11.5 - 15.5 % Final  . Platelets 10/25/2015 212  150 - 400 K/uL Final  . Sodium 10/25/2015 138  135 - 145 mmol/L Final  . Potassium 10/25/2015 4.3  3.5 - 5.1 mmol/L Final  . Chloride 10/25/2015 105  101 - 111 mmol/L Final  . CO2 10/25/2015 24  22 - 32 mmol/L Final  . Glucose, Bld 10/25/2015 93  65 - 99 mg/dL Final  . BUN 10/25/2015 28* 6 - 20 mg/dL Final  . Creatinine, Ser 10/25/2015 0.90  0.61 - 1.24 mg/dL Final  . Calcium 10/25/2015 8.8* 8.9 - 10.3 mg/dL Final  . Total Protein 10/25/2015 6.6  6.5 - 8.1 g/dL Final  . Albumin 10/25/2015 4.0  3.5 - 5.0 g/dL Final  . AST 10/25/2015 22  15 - 41 U/L Final  . ALT 10/25/2015 18  17 - 63 U/L Final  . Alkaline Phosphatase 10/25/2015 60  38 - 126 U/L Final  . Total Bilirubin 10/25/2015 0.7  0.3 - 1.2 mg/dL Final  . GFR calc non Af Amer 10/25/2015 >60  >60 mL/min Final  . GFR calc Af Amer 10/25/2015 >60  >60 mL/min Final   Comment: (NOTE) The eGFR has been calculated using the CKD EPI equation. This calculation has not been validated in all clinical situations. eGFR's persistently <60 mL/min signify possible Chronic Kidney Disease.   . Anion gap 10/25/2015 9  5 - 15 Final  . Prothrombin Time 10/25/2015 14.8  11.6 - 15.2 seconds Final  . INR 10/25/2015 1.14  0.00 - 1.49 Final  . ABO/RH(D) 10/25/2015 A POS   Final  . Antibody Screen 10/25/2015 NEG   Final  . Sample Expiration 10/25/2015 11/07/2015   Final  . Extend sample reason 10/25/2015 NO TRANSFUSIONS OR PREGNANCY IN THE  PAST 3 MONTHS   Final  . Color, Urine 10/25/2015 YELLOW  YELLOW Final  . APPearance 10/25/2015 CLEAR  CLEAR Final  . Specific Gravity, Urine 10/25/2015 1.021  1.005 -  1.030 Final  . pH 10/25/2015 6.5  5.0 - 8.0 Final  . Glucose, UA 10/25/2015 NEGATIVE  NEGATIVE mg/dL Final  . Hgb urine dipstick 10/25/2015 NEGATIVE  NEGATIVE Final  . Bilirubin Urine 10/25/2015 NEGATIVE  NEGATIVE Final  . Ketones, ur 10/25/2015 NEGATIVE  NEGATIVE mg/dL Final  . Protein, ur 10/25/2015 NEGATIVE  NEGATIVE mg/dL Final  . Nitrite 10/25/2015 NEGATIVE  NEGATIVE Final  . Leukocytes, UA 10/25/2015 NEGATIVE  NEGATIVE Final   MICROSCOPIC NOT DONE ON URINES WITH NEGATIVE PROTEIN, BLOOD, LEUKOCYTES, NITRITE, OR GLUCOSE <1000 mg/dL.  Marland Kitchen MRSA, PCR 10/25/2015 NEGATIVE  NEGATIVE Final  . Staphylococcus aureus 10/25/2015 NEGATIVE  NEGATIVE Final   Comment:        The Xpert SA Assay (FDA approved for NASAL specimens in patients over 86 years of age), is one component of a comprehensive surveillance program.  Test performance has been validated by [facility] for patients greater than or equal to 20 year old. It is not intended to diagnose infection nor to guide or monitor treatment.   . ABO/RH(D) 10/25/2015 A POS   Final     X-Rays:No results found.  EKG: Orders placed or performed in visit on 10/30/15  . EKG 12-Lead     Hospital Course: Carlos Baldwin is a 81 y.o. who was admitted to [facility]. They were brought to the operating room on 11/04/2015 and underwent Procedure(s): LEFT TOTAL KNEE ARTHROPLASTY.  Patient tolerated the procedure well and was later transferred to the recovery room and then to the orthopaedic floor for postoperative care.  They were given PO and IV analgesics for pain control following their surgery.  They were given 24 hours of postoperative antibiotics of  Anti-infectives    Start     Dose/Rate Route Frequency Ordered Stop   11/04/15 1530  ceFAZolin (ANCEF) IVPB 2 g/50 mL  premix     2 g 100 mL/hr over 30 Minutes Intravenous Every 6 hours 11/04/15 1209 11/04/15 2219   11/04/15 0646  ceFAZolin (ANCEF) IVPB 2 g/50 mL premix     2 g 100 mL/hr over 30 Minutes Intravenous On call to O.R. 11/04/15 2482 11/04/15 0911     and started on DVT prophylaxis in the form of Xarelto.   PT and OT were ordered for total joint protocol.  Discharge planning consulted to help with postop disposition and equipment needs.  Patient had a good night on the evening of surgery. He walked 70 feet following surgery. They started to get up OOB with therapy again on day one. Hemovac drain was pulled without difficulty. Dressing was checked on day one and it was clean and day. Patient was seen in rounds and worked with therapy. He needed another day and after meeting his goals, was discharged on POD 2.  Discharge home with home health Diet - Cardiac diet Follow up - in 2 weeks Activity - WBAT Disposition - Home Condition Upon Discharge - Good D/C Meds - See DC Summary DVT Prophylaxis - Xarelto      Discharge Instructions    Call MD / Call 911    Complete by:  As directed   If you experience chest pain or shortness of breath, CALL 911 and be transported to the hospital emergency room.  If you develope a fever above 101 F, pus (white drainage) or increased drainage or redness at the wound, or calf pain, call your surgeon's office.     Change dressing    Complete by:  As  directed   Change dressing daily with sterile 4 x 4 inch gauze dressing and apply TED hose. Do not submerge the incision under water.     Constipation Prevention    Complete by:  As directed   Drink plenty of fluids.  Prune juice may be helpful.  You may use a stool softener, such as Colace (over the counter) 100 mg twice a day.  Use MiraLax (over the counter) for constipation as needed.     Diet - low sodium heart healthy    Complete by:  As directed      Discharge instructions    Complete by:  As directed   Pick up  stool softner and laxative for home use following surgery while on pain medications. Do not submerge incision under water. May remove the surgical dressing tomorrow, Wednesday 11/06/2015, and then apply a dry gauze dressing daily. Please use good hand washing techniques while changing dressing each day. May shower starting three days after surgery starting Thursday 11/07/2015. Please use a clean towel to pat the incision dry following showers. Continue to use ice for pain and swelling after surgery. Do not use any lotions or creams on the incision until instructed by your surgeon.  Postoperative Constipation Protocol  Constipation - defined medically as fewer than three stools per week and severe constipation as less than one stool per week.  One of the most common issues patients have following surgery is constipation. Even if you have a regular bowel pattern at home, your normal regimen is likely to be disrupted due to multiple reasons following surgery. Combination of anesthesia, postoperative narcotics, change in appetite and fluid intake all can affect your bowels. In order to avoid complications following surgery, here are some recommendations in order to help you during your recovery period.  Colace (docusate) - Pick up an over-the-counter form of Colace or another stool softener and take twice a day as long as you are requiring postoperative pain medications. Take with a full glass of water daily. If you experience loose stools or diarrhea, hold the colace until you stool forms back up. If your symptoms do not get better within 1 week or if they get worse, check with your doctor.  Dulcolax (bisacodyl) - Pick up over-the-counter and take as directed by the product packaging as needed to assist with the movement of your bowels. Take with a full glass of water. Use this product as needed if not relieved by Colace only.   MiraLax (polyethylene glycol) - Pick up over-the-counter to have on  hand. MiraLax is a solution that will increase the amount of water in your bowels to assist with bowel movements. Take as directed and can mix with a glass of water, juice, soda, coffee, or tea. Take if you go more than two days without a movement. Do not use MiraLax more than once per day. Call your doctor if you are still constipated or irregular after using this medication for 7 days in a row.  If you continue to have problems with postoperative constipation, please contact the office for further assistance and recommendations. If you experience "the worst abdominal pain ever" or develop nausea or vomiting, please contact the office immediatly for further recommendations for treatment.  Take Xarelto for two and a half more weeks, then discontinue Xarelto. Once the patient has completed the blood thinner regimen, then take a Baby 81 mg Aspirin daily for three more weeks.     Do not put a pillow under the  knee. Place it under the heel.    Complete by:  As directed      Do not sit on low chairs, stoools or toilet seats, as it may be difficult to get up from low surfaces    Complete by:  As directed      Driving restrictions    Complete by:  As directed   No driving until released by the physician.     Increase activity slowly as tolerated    Complete by:  As directed      Lifting restrictions    Complete by:  As directed   No lifting until released by the physician.     Patient may shower    Complete by:  As directed   You may shower without a dressing once there is no drainage.  Do not wash over the wound.  If drainage remains, do not shower until drainage stops.     TED hose    Complete by:  As directed   Use stockings (TED hose) for 3 weeks on both leg(s).  You may remove them at night for sleeping.     Weight bearing as tolerated    Complete by:  As directed   Laterality:  left  Extremity:  Lower            Medication List    STOP taking these medications         cholecalciferol 1000 units tablet  Commonly known as:  VITAMIN D     Fish Oil 1000 MG Caps     meloxicam 15 MG tablet  Commonly known as:  MOBIC     VITAMIN B-12 PO     vitamin C 500 MG tablet  Commonly known as:  ASCORBIC ACID     vitamin E 400 UNIT capsule      TAKE these medications        finasteride 5 MG tablet  Commonly known as:  PROSCAR  take 1 tablet by mouth once daily for prostate     losartan 100 MG tablet  Commonly known as:  COZAAR  Take 1 tablet (100 mg total) by mouth daily.     methocarbamol 500 MG tablet  Commonly known as:  ROBAXIN  Take 1 tablet (500 mg total) by mouth every 6 (six) hours as needed for muscle spasms.     oxyCODONE 5 MG immediate release tablet  Commonly known as:  Oxy IR/ROXICODONE  Take 1-2 tablets (5-10 mg total) by mouth every 3 (three) hours as needed for moderate pain or severe pain.     rivaroxaban 10 MG Tabs tablet  Commonly known as:  XARELTO  Take 1 tablet (10 mg total) by mouth daily with breakfast. Take Xarelto for two and a half more weeks, then discontinue Xarelto. Once the patient has completed the blood thinner regimen, then take a Baby 81 mg Aspirin daily for three more weeks.     SYSTANE OP  Apply 1 drop to eye 2 (two) times daily as needed (Dry eyes).     tamsulosin 0.4 MG Caps capsule  Commonly known as:  FLOMAX  take 1 capsule by mouth once daily for prostate       Follow-up Information    Follow up with Gearlean Alf, MD. Schedule an appointment as soon as possible for a visit on 11/19/2015.   Specialty:  Orthopedic Surgery   Why:  Call office at [phone] to setup appointment on Tuesday 11/19/2015 with Dr. Wynelle Link.  Contact information:   [address] [place] [address] [phone]       Follow up with [facility].   Why:  rolling walker   Contact information:   [place] [address] [phone]       Signed: Arlee Muslim,  PA-C Orthopaedic Surgery 11/20/2015, 8:51 PM

## 2015-11-05 NOTE — Progress Notes (Signed)
Physical Therapy Treatment Patient Details Name: Carlos Baldwin MRN: [id number] DOB: Aug 11, 1935 Today's Date: 11/05/2015    History of Present Illness LTKA    PT Comments    The patient  C/O feeling dizzy/woozy, even before standing. BP 154/71, sitting 156/66, after ambulating 151/64. Di not get  Any worse.  Patient reports possible DC today .  Will see in PM  Follow Up Recommendations        Equipment Recommendations  Rolling walker with 5" wheels    Recommendations for Other Services       Precautions / Restrictions Precautions Precautions: Knee Precaution Comments: reviewed knee precautions and no pillow under knee  Required Braces or Orthoses: Knee Immobilizer - Left Knee Immobilizer - Left: Discontinue once straight leg raise with < 10 degree lag    Mobility  Bed Mobility Overal bed mobility: Needs Assistance Bed Mobility: Sit to Supine     Supine to sit: Min assist Sit to supine: Min guard   General bed mobility comments: cues for technique  Transfers Overall transfer level: Needs assistance Equipment used: Rolling walker (2 wheeled) Transfers: Sit to/from Stand Sit to Stand: Supervision         General transfer comment: cues for hand and R leg position  Ambulation/Gait Ambulation/Gait assistance: Min guard Ambulation Distance (Feet): 125 Feet Assistive device: Rolling walker (2 wheeled) Gait Pattern/deviations: Step-to pattern;Step-through pattern     General Gait Details: cues for sequence   Stairs Stairs: Yes Stairs assistance: Min assist Stair Management: Backwards;With walker Number of Stairs: 2 General stair comments: son present to instruct , cues for safety and sequence  Wheelchair Mobility    Modified Rankin (Stroke Patients Only)       Balance Overall balance assessment: Needs assistance Sitting-balance support: No upper extremity supported;Feet supported Sitting balance-Leahy Scale: Good     Standing balance  support: Bilateral upper extremity supported;During functional activity Standing balance-Leahy Scale: Good                      Cognition Arousal/Alertness: Awake/alert                          Exercises Total Joint Exercises Ankle Circles/Pumps: AROM;Both;10 reps;Supine Quad Sets: AROM;Both;10 reps;Supine Towel Squeeze: AROM;Left;10 reps Short Arc Quad: AROM;Left;10 reps;Supine Heel Slides: AROM;Left;10 reps;Supine Hip ABduction/ADduction: AROM;Left;10 reps;Supine Straight Leg Raises: AROM;Left;10 reps;Supine Goniometric ROM: 15-60 L knee flexion    General Comments        Pertinent Vitals/Pain Pain Score: 4     Home Living                      Prior Function            PT Goals (current goals can now be found in the care plan section) Acute Rehab PT Goals Patient Stated Goal: go home today Progress towards PT goals: Progressing toward goals    Frequency  7X/week    PT Plan Current plan remains appropriate    Co-evaluation             End of Session Equipment Utilized During Treatment: Left knee immobilizer Activity Tolerance: Patient tolerated treatment well Patient left: in bed;with call bell/phone within reach;with family/visitor present     Time: [id number] PT Time Calculation (min) (ACUTE ONLY): 46 min  Charges:  $Gait Training: 8-22 mins $Therapeutic Exercise: 8-22 mins $Self Care/Home Management: 8-22  G Codes:      [name] 11/05/2015, 2:10 PM Tresa Endo PT [phone]

## 2015-11-05 NOTE — Progress Notes (Signed)
   Subjective: 1 Day Post-Op Procedure(s) (LRB): LEFT TOTAL KNEE ARTHROPLASTY (Left) Patient reports pain as mild.   Patient seen in rounds with Dr. Wynelle Link.  Doing well this morning. Patient is well, and has had no acute complaints or problems We will resume therapy today. He walked 32 feet yesterday following surgery. If they do well with therapy and meets all goals, then will allow home later this afternoon following therapy. Plan is to go Home after hospital stay.  Objective: Vital signs in last 24 hours: Temp:  [97.2 F (36.2 C)-98.1 F (36.7 C)] 98.1 F (36.7 C) (02/28 0649) Pulse Rate:  [66-95] 91 (02/28 0649) Resp:  [0-20] 16 (02/28 0649) BP: (127-162)/(70-87) 157/73 mmHg (02/28 0649) SpO2:  [96 %-100 %] 100 % (02/28 0649) Weight:  [80.74 kg (178 lb)] 80.74 kg (178 lb) (02/27 1157)  Intake/Output from previous day:  Intake/Output Summary (Last 24 hours) at 11/05/15 0804 Last data filed at 11/05/15 [id number]  Gross per 24 hour  Intake 4026.67 ml  Output   2075 ml  Net 1951.67 ml    Intake/Output this shift: 750 since around [place]  Labs:  Recent Labs  11/05/15 0347  HGB 10.7*    Recent Labs  11/05/15 0347  WBC 13.8*  RBC 3.57*  HCT 32.5*  PLT 181    Recent Labs  11/05/15 0347  NA 140  K 4.2  CL 109  CO2 26  BUN 17  CREATININE 0.89  GLUCOSE 134*  CALCIUM 8.3*   No results for input(s): LABPT, INR in the last 72 hours.  EXAM General - Patient is Alert, Appropriate and Oriented Extremity - Neurovascular intact Sensation intact distally Dorsiflexion/Plantar flexion intact Dressing - dressing C/D/I Motor Function - intact, moving foot and toes well on exam.  Hemovac pulled without difficulty.  Past Medical History  Diagnosis Date  . Hyperlipidemia   . Hypertension   . Arthritis     oa  . Asthma     as child  . Measles     as child  . Mumps     as child    Assessment/Plan: 1 Day Post-Op Procedure(s) (LRB): LEFT TOTAL KNEE ARTHROPLASTY  (Left) Principal Problem:   OA (osteoarthritis) of knee  Estimated body mass index is 26.27 kg/(m^2) as calculated from the following:   Height as of this encounter: 5\' 9"  (1.753 m).   Weight as of this encounter: 80.74 kg (178 lb). Advance diet Up with therapy Discharge home if meets goals - No HHPT  DVT Prophylaxis - Xarelto Weight-Bearing as tolerated to left leg D/C O2 and Pulse OX and try on Room Air HGB 10.7 following surgery.  If meets goals and able to go home: Discharge home - No Home Health PT Diet - Cardiac diet Follow up - in 2 weeks on March 14, Tuesday Activity - WBAT Disposition - Home Condition Upon Discharge - Pending D/C Meds - See DC Summary DVT Prophylaxis - Xarelto for three weeks.  Arlee Muslim, PA-C Orthopaedic Surgery

## 2015-11-05 NOTE — Progress Notes (Signed)
[facility]  Delivered rw to room  Linward Headland 11/05/2015, 12:26 PM

## 2015-11-05 NOTE — Evaluation (Signed)
Occupational Therapy Evaluation Patient Details Name: Carlos Baldwin MRN: [id number] DOB: [birth year] Today's Date: 11/05/2015    History of Present Illness LTKA   Clinical Impression   Patient admitted with above. Patient independent PTA. Patient currently functioning at an overall supervision to min assist level.  No additional OT needs identified, D/C from acute OT services and no additional follow-up OT needs at this time. All appropriate education provided to patient. Please re-order OT if needed.      Follow Up Recommendations  No OT follow up;Supervision - Intermittent    Equipment Recommendations  3 in 1 bedside comode    Recommendations for Other Services  None at this time    Precautions / Restrictions Precautions Precautions: Knee Precaution Comments: reviewed knee precautions and no pillow under knee  Required Braces or Orthoses: Knee Immobilizer - Left Knee Immobilizer - Left: Discontinue once straight leg raise with < 10 degree lag Restrictions Weight Bearing Restrictions: Yes LLE Weight Bearing: Weight bearing as tolerated    Mobility Bed Mobility Overal bed mobility: Needs Assistance Bed Mobility: Supine to Sit     Supine to sit: Min assist     General bed mobility comments: support R leg  Transfers Overall transfer level: Needs assistance Equipment used: Rolling walker (2 wheeled) Transfers: Sit to/from Stand Sit to Stand: Supervision;Min guard General transfer comment: cues for hand and R leg position    Balance Overall balance assessment: Needs assistance Sitting-balance support: No upper extremity supported;Feet supported Sitting balance-Leahy Scale: Good     Standing balance support: Bilateral upper extremity supported;During functional activity Standing balance-Leahy Scale: Good    ADL Overall ADL's : Needs assistance/impaired  General ADL Comments: Pt overall supervision with ADLs and functional mobility. Pt able to reach BLEs  for LB ADLs. Pt ambualted into BR for practice with toilet transfer. Educated pt on using 3-n-1 in shower, pt states he prefers to stand. Secondary to this, recommend pt wait a few days for healing and strengthening prior to getting into shower and when he does shower recommend his wife be there for safety.     Pertinent Vitals/Pain Pain Assessment: 0-10 Pain Score: 4  Pain Location: right knee Pain Descriptors / Indicators: Aching;Sore Pain Intervention(s): Monitored during session;Repositioned;Ice applied     Hand Dominance Right   Extremity/Trunk Assessment Upper Extremity Assessment Upper Extremity Assessment: Overall WFL for tasks assessed   Lower Extremity Assessment Lower Extremity Assessment: Defer to PT evaluation   Cervical / Trunk Assessment Cervical / Trunk Assessment: Normal   Communication Communication Communication: No difficulties   Cognition Arousal/Alertness: Awake/alert Behavior During Therapy: WFL for tasks assessed/performed Overall Cognitive Status: Within Functional Limits for tasks assessed              Home Living Family/patient expects to be discharged to:: Private residence Living Arrangements: Spouse/significant other Available Help at Discharge: Family Type of Home: House Home Access: Stairs to enter Technical brewer of Steps: 3 Entrance Stairs-Rails: None Home Layout: Two level;Able to live on main level with bedroom/bathroom     Bathroom Shower/Tub: Tub/shower unit;Curtain   Bathroom Toilet: Standard     Home Equipment: Grab bars - tub/shower;Crutches    Prior Functioning/Environment Level of Independence: Independent  Comments: bicycle Engineer, structural.    OT Diagnosis: Generalized weakness;Acute pain   OT Problem List:   N/a, no acute OT needs identified at this time     OT Treatment/Interventions:   N/a, no acute OT needs identified at this time  OT Goals(Current goals can be found in the care plan section) Acute  Rehab OT Goals Patient Stated Goal: go home today OT Goal Formulation: All assessment and education complete, DC therapy  OT Frequency:   N/a, no acute OT needs identified at this time     Barriers to D/C:  none known at this time    End of Session Equipment Utilized During Treatment: Gait belt;Rolling walker;Left knee immobilizer CPM Left Knee CPM Left Knee: Off Nurse Communication: Mobility status  Activity Tolerance: Patient tolerated treatment well Patient left: in chair;with call bell/phone within reach;with chair alarm set   Time: [phone] OT Time Calculation (min): 35 min Charges:  OT General Charges $OT Visit: 1 Procedure OT Evaluation $OT Eval Low Complexity: 1 Procedure OT Treatments $Self Care/Home Management : 8-22 mins  Chrys Racer , MS, OTR/L, CLT Pager: [phone]  11/05/2015, 10:36 AM

## 2015-11-05 NOTE — Progress Notes (Signed)
Physical Therapy Treatment Patient Details Name: DEAUNDRA BOLINGER MRN: [id number] DOB: [birth year] Today's Date: 11/05/2015    History of Present Illness LTKA    PT Comments    The patient reports not  Having eaten since this AM, Pain is controlled. Do not feel patient is ready for DC tonight. Family concerned for nausea and lack of appetite. RN aware.  Follow Up Recommendations        Equipment Recommendations  Rolling walker with 5" wheels    Recommendations for Other Services       Precautions / Restrictions Precautions Precautions: Knee Precaution Comments: reviewed knee precautions and no pillow under knee  Required Braces or Orthoses: Knee Immobilizer - Left Knee Immobilizer - Left: Discontinue once straight leg raise with < 10 degree lag    Mobility  Bed Mobility   Bed Mobility: Supine to Sit;Sit to Supine     Supine to sit: Supervision Sit to supine: Supervision   General bed mobility comments: manages  R leg  Transfers Overall transfer level: Needs assistance Equipment used: Rolling walker (2 wheeled) Transfers: Sit to/from Stand Sit to Stand: Supervision         General transfer comment: cues for hand and R leg position  Ambulation/Gait Ambulation/Gait assistance: Min guard Ambulation Distance (Feet): 125 Feet Assistive device: Rolling walker (2 wheeled) Gait Pattern/deviations: Step-to pattern;Step-through pattern     General Gait Details: cues for sequence   Stairs Stairs: Yes Stairs assistance: Min assist Stair Management: Backwards;With walker Number of Stairs: 2 General stair comments: son present to instruct , cues for safety and sequence  Wheelchair Mobility    Modified Rankin (Stroke Patients Only)       Balance                                    Cognition Arousal/Alertness: Awake/alert Behavior During Therapy: WFL for tasks assessed/performed                        Exercises Total Joint  Exercises Ankle Circles/Pumps: AROM;Both;10 reps;Supine Quad Sets: AROM;Both;10 reps;Supine Towel Squeeze: AROM;Left;10 reps Short Arc Quad: AROM;Left;10 reps;Supine Heel Slides: AROM;Left;10 reps;Supine Hip ABduction/ADduction: AROM;Left;10 reps;Supine Straight Leg Raises: AROM;Left;10 reps;Supine Goniometric ROM: 15-60 L knee flexion    General Comments        Pertinent Vitals/Pain Pain Score: 4  Pain Location: R knee Pain Descriptors / Indicators: Aching Pain Intervention(s): Limited activity within patient's tolerance;Monitored during session;Repositioned;Ice applied    Home Living                      Prior Function            PT Goals (current goals can now be found in the care plan section) Progress towards PT goals: Progressing toward goals    Frequency  7X/week    PT Plan Current plan remains appropriate    Co-evaluation             End of Session Equipment Utilized During Treatment: Left knee immobilizer Activity Tolerance: Patient tolerated treatment well Patient left: in bed;with call bell/phone within reach;with family/visitor present     Time: [id number] PT Time Calculation (min) (ACUTE ONLY): 26 min  Charges:  $Gait Training: 8-22 mins $Therapeutic Exercise: 8-22 mins $Self Care/Home Management: 8-22  G Codes:      [name] 11/05/2015, 5:00 PM Tresa Endo PT [phone]

## 2015-11-05 NOTE — Progress Notes (Signed)
Pt has had intermittent nausea all day, has not eaten much, and verbalizes concern after readiness to go home today. Family has also expressed concern. Paged PA, Arlee Muslim, and d/c order cancelled.

## 2015-11-05 NOTE — Care Management Note (Signed)
Case Management Note  Patient Details  Name: Carlos Baldwin MRN: [id number] Date of Birth: 1935/05/21  Subjective/Objective:                  LEFT TOTAL KNEE ARTHROPLASTY (Left) Action/Plan: Discharge planning Expected Discharge Date:  11/06/15               Expected Discharge Plan:  Home/Self Care  In-House Referral:     Discharge planning Services  CM Consult  Post Acute Care Choice:    Choice offered to:  Patient  DME Arranged:  Gilford Rile rolling DME Agency:  [place]:  NA [place] Agency:  NA  Status of Service:  Completed, signed off  Medicare Important Message Given:    Date Medicare IM Given:    Medicare IM give by:    Date Additional Medicare IM Given:    Additional Medicare Important Message give by:     If discussed at [place] of Stay Meetings, dates discussed:    Additional Comments: CM met with pt in room to confirm Carlos Baldwin note stating pt will receive Virtual PT at home.  Pt confirms.  CM called [facility] DME rep, Lecretia to please deliver the rolling walker to room prior to discharge.  No other CM needs were communicated. Dellie Catholic, RN 11/05/2015, 2:02 PM

## 2015-11-06 LAB — BASIC METABOLIC PANEL
ANION GAP: 6 (ref 5–15)
BUN: 16 mg/dL (ref 6–20)
CALCIUM: 8.1 mg/dL — AB (ref 8.9–10.3)
CO2: 26 mmol/L (ref 22–32)
CREATININE: 0.84 mg/dL (ref 0.61–1.24)
Chloride: 102 mmol/L (ref 101–111)
GFR calc Af Amer: >60 mL/min (ref >60)
GFR calc non Af Amer: >60 mL/min (ref >60)
GLUCOSE: 130 mg/dL — AB (ref 65–99)
Potassium: 3.9 mmol/L (ref 3.5–5.1)
Sodium: 134 mmol/L — ABNORMAL LOW (ref 135–145)

## 2015-11-06 LAB — CBC
HEMATOCRIT: 31 % — AB (ref 39.0–52.0)
Hemoglobin: 10.3 g/dL — ABNORMAL LOW (ref 13.0–17.0)
MCH: 29.9 pg (ref 26.0–34.0)
MCHC: 33.2 g/dL (ref 30.0–36.0)
MCV: 90.1 fL (ref 78.0–100.0)
Platelets: 166 10*3/uL (ref 150–400)
RBC: 3.44 MIL/uL — ABNORMAL LOW (ref 4.22–5.81)
RDW: 13.6 % (ref 11.5–15.5)
WBC: 12.6 10*3/uL — ABNORMAL HIGH (ref 4.0–10.5)

## 2015-11-06 NOTE — Progress Notes (Signed)
Physical Therapy Treatment Patient Details Name: Carlos Baldwin MRN: [id number] DOB: 03/30/1935 Today's Date: 11/06/2015    History of Present Illness LTKA    PT Comments    Progressing well. Will see one more visit. DC today.  Follow Up Recommendations   (virtual PT)     Equipment Recommendations       Recommendations for Other Services       Precautions / Restrictions Precautions Precautions: Knee Restrictions Weight Bearing Restrictions: No LLE Weight Bearing: Weight bearing as tolerated    Mobility  Bed Mobility Overal bed mobility: Modified Independent                Transfers Overall transfer level: Needs assistance Equipment used: Rolling walker (2 wheeled) Transfers: Sit to/from Stand Sit to Stand: Supervision            Ambulation/Gait Ambulation/Gait assistance: Supervision Ambulation Distance (Feet): 155 Feet Assistive device: Rolling walker (2 wheeled) Gait Pattern/deviations: Step-to pattern;Step-through pattern     General Gait Details: cues for sequence   Stairs            Wheelchair Mobility    Modified Rankin (Stroke Patients Only)       Balance                                    Cognition Arousal/Alertness: Awake/alert                          Exercises Total Joint Exercises Ankle Circles/Pumps: AROM;Both;10 reps;Supine Quad Sets: AROM;Both;10 reps;Supine Towel Squeeze: AROM;Left;10 reps Short Arc Quad: AROM;Left;10 reps;Supine Heel Slides: AROM;Left;10 reps;Supine Straight Leg Raises: AROM;Left;10 reps;Supine Long Arc Quad: AROM;Left;10 reps;Seated Knee Flexion: AROM;Left;10 reps;Seated Goniometric ROM: 15-65 L knee flexion    General Comments        Pertinent Vitals/Pain Pain Score: 3  Pain Location: L knee Pain Descriptors / Indicators: Tightness Pain Intervention(s): Monitored during session;Premedicated before session;Ice applied;Repositioned    Home Living                       Prior Function            PT Goals (current goals can now be found in the care plan section) Progress towards PT goals: Progressing toward goals    Frequency       PT Plan Current plan remains appropriate    Co-evaluation             End of Session   Activity Tolerance: Patient tolerated treatment well Patient left: in bed;with call bell/phone within reach     Time: [id number] PT Time Calculation (min) (ACUTE ONLY): 43 min  Charges:  $Gait Training: 8-22 mins $Therapeutic Exercise: 8-22 mins $Self Care/Home Management: 8-22                    G Codes:      [name] 11/06/2015, 11:15 AM

## 2015-11-06 NOTE — Progress Notes (Signed)
RN reviewed discharge instructions with patient and family. All questions answered.  Paperwork and prescriptions given.   NT rolled patient down in wheelchair to family car.  

## 2015-11-06 NOTE — Progress Notes (Signed)
Physical Therapy Treatment Patient Details Name: Carlos Baldwin MRN: [id number] DOB: Nov 12, 1934 Today's Date: 11/06/2015    History of Present Illness LTKA    PT Comments    10 m walk test performed. Reviewed  Precautions and edema control. To have Virtual therapy.  Follow Up Recommendations   (virtual PT)     Equipment Recommendations       Recommendations for Other Services       Precautions / Restrictions Precautions Precautions: Knee    Mobility  Bed Mobility Overal bed mobility: Modified Independent                Transfers Overall transfer level: Needs assistance Equipment used: Rolling walker (2 wheeled) Transfers: Sit to/from Stand Sit to Stand: Supervision         General transfer comment: cues for hand and R leg position  Ambulation/Gait Ambulation/Gait assistance: Supervision Ambulation Distance (Feet): 100 Feet Assistive device: Rolling walker (2 wheeled) Gait Pattern/deviations: Step-to pattern;Step-through pattern     General Gait Details: cues for sequence   Stairs            Wheelchair Mobility    Modified Rankin (Stroke Patients Only)       Balance                                    Cognition Arousal/Alertness: Awake/alert                          Exercises Total Joint Exercises Ankle Circles/Pumps: AROM;Both;10 reps;Supine Quad Sets: AROM;Both;10 reps;Supine Towel Squeeze: AROM;Left;10 reps Short Arc Quad: AROM;Left;10 reps;Supine Heel Slides: AROM;Left;10 reps;Supine Straight Leg Raises: AROM;Left;10 reps;Supine Long Arc Quad: AROM;Left;10 reps;Seated Knee Flexion: AROM;Left;10 reps;Seated Goniometric ROM: 15-65 L knee flexion    General Comments        Pertinent Vitals/Pain Pain Score: 2  Pain Location: L knee Pain Descriptors / Indicators: Tightness Pain Intervention(s): Monitored during session;Premedicated before session;Ice applied;Repositioned    Home Living                       Prior Function            PT Goals (current goals can now be found in the care plan section) Progress towards PT goals: Progressing toward goals    Frequency       PT Plan Current plan remains appropriate    Co-evaluation             End of Session Equipment Utilized During Treatment: Left knee immobilizer Activity Tolerance: Patient tolerated treatment well Patient left: in chair;with call bell/phone within reach;with family/visitor present     Time: 1210-1225 PT Time Calculation (min) (ACUTE ONLY): 15 min  Charges:  $Gait Training: 8-22 mins $Therapeutic Exercise: 8-22 mins $Self Care/Home Management: May 18, 2023                    G Codes:      [name] 11/06/2015, 1:38 PM

## 2015-11-06 NOTE — Progress Notes (Signed)
   Subjective: 2 Days Post-Op Procedure(s) (LRB): LEFT TOTAL KNEE ARTHROPLASTY (Left) Patient reports pain as mild.   Patient seen in rounds with Dr. Wynelle Link. Patient is well, and has had no acute complaints or problems Patient is ready to go home  Objective: Vital signs in last 24 hours: Temp:  [98 F (36.7 C)-99.1 F (37.3 C)] 99.1 F (37.3 C) (03/01 0513) Pulse Rate:  [87-110] 110 (03/01 0513) Resp:  [17-18] 18 (03/01 0513) BP: (156-158)/(69-74) 158/72 mmHg (03/01 0513) SpO2:  [93 %-99 %] 94 % (03/01 0513)  Intake/Output from previous day:  Intake/Output Summary (Last 24 hours) at 11/06/15 0712 Last data filed at 11/06/15 0508  Gross per 24 hour  Intake    900 ml  Output    175 ml  Net    725 ml    Intake/Output this shift:    Labs:  Recent Labs  11/05/15 0347 11/06/15 0357  HGB 10.7* 10.3*    Recent Labs  11/05/15 0347 11/06/15 0357  WBC 13.8* 12.6*  RBC 3.57* 3.44*  HCT 32.5* 31.0*  PLT 181 166    Recent Labs  11/05/15 0347 11/06/15 0357  NA 140 134*  K 4.2 3.9  CL 109 102  CO2 26 26  BUN 17 16  CREATININE 0.89 0.84  GLUCOSE 134* 130*  CALCIUM 8.3* 8.1*   No results for input(s): LABPT, INR in the last 72 hours.  EXAM: General - Patient is Alert and Appropriate Extremity - Neurovascular intact Sensation intact distally Incision - clean, dry, no drainage Motor Function - intact, moving foot and toes well on exam.   Assessment/Plan: 2 Days Post-Op Procedure(s) (LRB): LEFT TOTAL KNEE ARTHROPLASTY (Left) Procedure(s) (LRB): LEFT TOTAL KNEE ARTHROPLASTY (Left) Past Medical History  Diagnosis Date  . Hyperlipidemia   . Hypertension   . Arthritis     oa  . Asthma     as child  . Measles     as child  . Mumps     as child   Principal Problem:   OA (osteoarthritis) of knee  Estimated body mass index is 26.27 kg/(m^2) as calculated from the following:   Height as of this encounter: 5\' 9"  (1.753 m).   Weight as of this  encounter: 80.74 kg (178 lb). Up with therapy Discharge home with home health Diet - Cardiac diet Follow up - in 2 weeks Activity - WBAT Disposition - Home Condition Upon Discharge - Good D/C Meds - See DC Summary DVT Prophylaxis - Xarelto  Arlee Muslim, PA-C Orthopaedic Surgery 11/06/2015, 7:12 AM

## 2015-12-09 ENCOUNTER — Other Ambulatory Visit: Payer: Self-pay | Admitting: Internal Medicine

## 2015-12-10 DIAGNOSIS — Z471 Aftercare following joint replacement surgery: Secondary | ICD-10-CM | POA: Diagnosis not present

## 2015-12-10 DIAGNOSIS — Z96652 Presence of left artificial knee joint: Secondary | ICD-10-CM | POA: Diagnosis not present

## 2015-12-30 NOTE — Progress Notes (Signed)
Patient ID: Carlos Baldwin, male   DOB: May 30, 1935, 81 y.o.   MRN: [id number]  Annual  Screening/Preventative Visit And Comprehensive Evaluation & Examination     This very nice 81 y.o. MWM presents for a Wellness/Preventative Visit & comprehensive evaluation and management of multiple medical co-morbidities.  Patient has been followed for HTN, Prediabetes, Hyperlipidemia and Vitamin D Deficiency. Patient had L TKR on Nov 04, 2015 and is doing well.     HTN predates since 2003. Patient's BP has been controlled at home.Today's BP: 118/64 mmHg. Patient denies any cardiac symptoms as chest pain, palpitations, shortness of breath, dizziness or ankle swelling.     Patient's hyperlipidemia is controlled with diet.  Last lipids were at goal with T Chol 150, HDL 60, TG 52 & LDL 80 in Apr 2016.     Patient has prediabetes since 2011 with A1c 6.1%  and patient denies reactive hypoglycemic symptoms, visual blurring, diabetic polys or paresthesias. Last A1c was 6.3% in Apr 2016.     Finally, patient has history of Vitamin D Deficiency of "35" and last vitamin D was "44" in Apr 2016.      Medication Sig  . Finasteride 5 MG take 1 tablet by mouth once daily for prostate   Vit D 2,000 u Take 3 caps = 6,000 units daily   bASA 81 mg  Take 1 tablet daily    Meloxicam 15 mg  Take 1/2 to 1 tab daily with food as needed for pain  . losartan 100 MG  Take 1 tablet (100 mg total) by mouth daily.  Carren Rang  Apply 1 drop to eye 2 (two) times daily as needed (Dry eyes).  . tamsulosin 0.4 MG  take 1 capsule by mouth once daily for prostate   Allergies  Allergen Reactions  . Atenolol Other (See Comments)    "not sure"  . Pravastatin     Joint pain  . Simvastatin     Joint pain  . Sulfonamide Derivatives Swelling and Rash   Past Medical History  Diagnosis Date  . Hyperlipidemia   . Hypertension   . Arthritis     oa  . Asthma     as child  . Measles     as child  . Mumps     as child   Health  Maintenance  Topic Date Due  . INFLUENZA VACCINE  04/07/2016  . TETANUS/TDAP  02/21/2019  . ZOSTAVAX  Completed  . PNA vac Low Risk Adult  Completed   Immunization History  Administered Date(s) Administered  . Pneumococcal Conjugate-13 06/27/2014  . Pneumococcal Polysaccharide-23 09/07/2006  . Zoster 09/07/2008   Past Surgical History  Procedure Laterality Date  . Knee surgery Right 1972    cartlidge repair  . Left knee arthroscopy  01-14-2012  . Total knee arthroplasty Left 11/04/2015    Procedure: LEFT TOTAL KNEE ARTHROPLASTY;  Surgeon: Gaynelle Arabian, MD;  Location: [facility];  Service: Orthopedics;  Laterality: Left;   Social History  . Marital Status: Married    Spouse Name: N/A  . Number of Children: N/A  . Years of Education: N/A   Occupational History  . Not on file.   Social History Main Topics  . Smoking status: Never Smoker   . Smokeless tobacco: Never Used  . Alcohol Use: No  . Drug Use: No  . Sexual Activity: Not on file    ROS Constitutional: Denies fever, chills, weight loss/gain, headaches, insomnia,  night sweats or change in  appetite. Does c/o fatigue. Eyes: Denies redness, blurred vision, diplopia, discharge, itchy or watery eyes.  ENT: Denies discharge, congestion, post nasal drip, epistaxis, sore throat, earache, hearing loss, dental pain, Tinnitus, Vertigo, Sinus pain or snoring.  Cardio: Denies chest pain, palpitations, irregular heartbeat, syncope, dyspnea, diaphoresis, orthopnea, PND, claudication or edema Respiratory: denies cough, dyspnea, DOE, pleurisy, hoarseness, laryngitis or wheezing.  Gastrointestinal: Denies dysphagia, heartburn, reflux, water brash, pain, cramps, nausea, vomiting, bloating, diarrhea, constipation, hematemesis, melena, hematochezia, jaundice or hemorrhoids Genitourinary: Denies dysuria, frequency, urgency, nocturia, hesitancy, discharge, hematuria or flank pain Musculoskeletal: having some pains in his R knee. . Denies  Falls. Skin: Denies puritis, rash, hives, warts, acne, eczema or change in skin lesion Neuro: No weakness, tremor, incoordination, spasms, paresthesia or pain Psychiatric: Denies confusion, memory loss or sensory loss. Denies Depression. Endocrine: Denies change in weight, skin, hair change, nocturia, and paresthesia, diabetic polys, visual blurring or hyper / hypo glycemic episodes.  Heme/Lymph: No excessive bleeding, bruising or enlarged lymph nodes.  Physical Exam  BP 118/64 mmHg  Pulse 92  Temp(Src) 97.5 F (36.4 C)  Resp 16  Ht 5\' 8"  (1.727 m)  Wt 166 lb 3.2 oz (75.388 kg)  BMI 25.28 kg/m2  General Appearance: Well nourished, in no apparent distress.  Eyes: PERRLA, EOMs, conjunctiva no swelling or erythema, normal fundi and vessels. Sinuses: No frontal/maxillary tenderness ENT/Mouth: EACs patent / TMs  nl. Nares clear without erythema, swelling, mucoid exudates. Oral hygiene is good. No erythema, swelling, or exudate. Tongue normal, non-obstructing. Tonsils not swollen or erythematous. Hearing normal.  Neck: Supple, thyroid normal. No bruits, nodes or JVD. Respiratory: Respiratory effort normal.  BS equal and clear bilateral without rales, rhonci, wheezing or stridor. Cardio: Heart sounds are normal with regular rate and rhythm and no murmurs, rubs or gallops. Peripheral pulses are normal and equal bilaterally without edema. No aortic or femoral bruits. Chest: symmetric with normal excursions and percussion.  Abdomen: Soft, with Nl bowel sounds. Nontender, no guarding, rebound, hernias, masses, or organomegaly.  Lymphatics: Non tender without lymphadenopathy.  Genitourinary: No hernias.Testes nl. DRE - prostate nl for age - smooth & firm w/o nodules. Musculoskeletal: Full ROM all peripheral extremities, joint stability, 5/5 strength, and normal gait. L knee with sl boggy synovium and healed vertical scar and no signs of cellulitis.  Skin: Warm and dry without rashes, lesions,  cyanosis, clubbing or  ecchymosis.  Neuro: Cranial nerves intact, reflexes equal bilaterally. Normal muscle tone, no cerebellar symptoms. Sensation intact.  Pysch: Alert and oriented X 3 with normal affect, insight and judgment appropriate.   Assessment and Plan  1. Annual Preventative/Screening Exam   - Microalbumin / creatinine urine ratio - [place], RETROPERITNL ABD,  LTD - POC Hemoccult Bld/Stl ( - Urinalysis, Routine w reflex microscopic  - PSA - CBC with Differential/Platelet - BASIC METABOLIC PANEL WITH GFR - Hepatic function panel - Magnesium - Lipid panel - TSH - Hemoglobin A1c - Insulin, random - VITAMIN D 25 Hydroxy   2. Essential hypertension  - Microalbumin / creatinine urine ratio - [place], RETROPERITNL ABD,  LTD - TSH  3. Hyperlipidemia  - Lipid panel - TSH  4. Prediabetes  - Hemoglobin A1c - Insulin, random  5. Vitamin D deficiency  - VITAMIN D 25 Hydroxy   6. Screening for rectal cancer  - POC Hemoccult Bld/Stl   7. Prostate cancer screening  - PSA  8. Screening for AAA (aortic abdominal aneurysm)   9. Medication management  - Urinalysis, Routine w reflex  microscopic  - CBC with Differential/Platelet - BASIC METABOLIC PANEL WITH GFR - Hepatic function panel - Magnesium   Continue prudent diet as discussed, weight control, BP monitoring, regular exercise, and medications as discussed.  Discussed med effects and SE's. Routine screening labs and tests as requested with regular follow-up as recommended. Over 40 minutes of exam, counseling, chart review and high complex critical decision making was performed

## 2015-12-30 NOTE — Patient Instructions (Signed)

## 2015-12-31 ENCOUNTER — Encounter: Payer: Self-pay | Admitting: Internal Medicine

## 2015-12-31 ENCOUNTER — Ambulatory Visit (INDEPENDENT_AMBULATORY_CARE_PROVIDER_SITE_OTHER): Payer: Medicare Other | Admitting: Internal Medicine

## 2015-12-31 VITALS — BP 118/64 | HR 92 | Temp 97.5°F | Resp 16 | Ht 68.0 in | Wt 166.2 lb

## 2015-12-31 DIAGNOSIS — R7309 Other abnormal glucose: Secondary | ICD-10-CM | POA: Diagnosis not present

## 2015-12-31 DIAGNOSIS — Z125 Encounter for screening for malignant neoplasm of prostate: Secondary | ICD-10-CM

## 2015-12-31 DIAGNOSIS — I10 Essential (primary) hypertension: Secondary | ICD-10-CM

## 2015-12-31 DIAGNOSIS — E782 Mixed hyperlipidemia: Secondary | ICD-10-CM | POA: Diagnosis not present

## 2015-12-31 DIAGNOSIS — Z1212 Encounter for screening for malignant neoplasm of rectum: Secondary | ICD-10-CM

## 2015-12-31 DIAGNOSIS — Z0001 Encounter for general adult medical examination with abnormal findings: Secondary | ICD-10-CM

## 2015-12-31 DIAGNOSIS — R7303 Prediabetes: Secondary | ICD-10-CM

## 2015-12-31 DIAGNOSIS — E559 Vitamin D deficiency, unspecified: Secondary | ICD-10-CM | POA: Diagnosis not present

## 2015-12-31 DIAGNOSIS — R6889 Other general symptoms and signs: Secondary | ICD-10-CM

## 2015-12-31 DIAGNOSIS — Z136 Encounter for screening for cardiovascular disorders: Secondary | ICD-10-CM

## 2015-12-31 DIAGNOSIS — Z79899 Other long term (current) drug therapy: Secondary | ICD-10-CM

## 2015-12-31 DIAGNOSIS — Z0000 Encounter for general adult medical examination without abnormal findings: Secondary | ICD-10-CM | POA: Diagnosis not present

## 2015-12-31 LAB — HEPATIC FUNCTION PANEL
ALK PHOS: 70 U/L (ref 40–115)
ALT: 13 U/L (ref 9–46)
AST: 14 U/L (ref 10–35)
Albumin: 4 g/dL (ref 3.6–5.1)
BILIRUBIN DIRECT: 0.1 mg/dL (ref <=0.2)
BILIRUBIN INDIRECT: 0.3 mg/dL (ref 0.2–1.2)
Total Bilirubin: 0.4 mg/dL (ref 0.2–1.2)
Total Protein: 6 g/dL — ABNORMAL LOW (ref 6.1–8.1)

## 2015-12-31 LAB — CBC WITH DIFFERENTIAL/PLATELET
BASOS ABS: 65 {cells}/uL (ref 0–200)
BASOS PCT: 1 %
EOS ABS: 130 {cells}/uL (ref 15–500)
EOS PCT: 2 %
HCT: 37.8 % — ABNORMAL LOW (ref 38.5–50.0)
HEMOGLOBIN: 12.5 g/dL — AB (ref 13.2–17.1)
LYMPHS ABS: 1235 {cells}/uL (ref 850–3900)
Lymphocytes Relative: 19 %
MCH: 29.8 pg (ref 27.0–33.0)
MCHC: 33.1 g/dL (ref 32.0–36.0)
MCV: 90 fL (ref 80.0–100.0)
MPV: 8.4 fL (ref 7.5–12.5)
Monocytes Absolute: 520 cells/uL (ref 200–950)
Monocytes Relative: 8 %
NEUTROS ABS: 4550 {cells}/uL (ref 1500–7800)
Neutrophils Relative %: 70 %
Platelets: 237 10*3/uL (ref 140–400)
RBC: 4.2 MIL/uL (ref 4.20–5.80)
RDW: 14.7 % (ref 11.0–15.0)
WBC: 6.5 10*3/uL (ref 3.8–10.8)

## 2015-12-31 LAB — BASIC METABOLIC PANEL WITH GFR
BUN: 23 mg/dL (ref 7–25)
CHLORIDE: 106 mmol/L (ref 98–110)
CO2: 25 mmol/L (ref 20–31)
CREATININE: 0.95 mg/dL (ref 0.70–1.18)
Calcium: 9.1 mg/dL (ref 8.6–10.3)
GFR, Est African American: 88 mL/min (ref >=60)
GFR, Est Non African American: 76 mL/min (ref >=60)
Glucose, Bld: 97 mg/dL (ref 65–99)
POTASSIUM: 4.2 mmol/L (ref 3.5–5.3)
SODIUM: 139 mmol/L (ref 135–146)

## 2015-12-31 LAB — LIPID PANEL
CHOLESTEROL: 167 mg/dL (ref 125–200)
HDL: 66 mg/dL (ref >=40)
LDL CALC: 87 mg/dL (ref <130)
TRIGLYCERIDES: 71 mg/dL (ref <150)
Total CHOL/HDL Ratio: 2.5 Ratio (ref <=5.0)
VLDL: 14 mg/dL (ref <30)

## 2015-12-31 LAB — MAGNESIUM: Magnesium: 1.8 mg/dL (ref 1.5–2.5)

## 2015-12-31 LAB — HEMOGLOBIN A1C
Hgb A1c MFr Bld: 5.5 % (ref <5.7)
MEAN PLASMA GLUCOSE: 111 mg/dL

## 2015-12-31 LAB — TSH: TSH: 1.29 mIU/L (ref 0.40–4.50)

## 2016-01-01 LAB — URINALYSIS, ROUTINE W REFLEX MICROSCOPIC
BILIRUBIN URINE: NEGATIVE
GLUCOSE, UA: NEGATIVE
Hgb urine dipstick: NEGATIVE
Ketones, ur: NEGATIVE
Leukocytes, UA: NEGATIVE
Nitrite: NEGATIVE
Protein, ur: NEGATIVE
SPECIFIC GRAVITY, URINE: 1.02 (ref 1.001–1.035)
pH: 5.5 (ref 5.0–8.0)

## 2016-01-01 LAB — VITAMIN D 25 HYDROXY (VIT D DEFICIENCY, FRACTURES): Vit D, 25-Hydroxy: 41 ng/mL (ref 30–100)

## 2016-01-01 LAB — PSA: PSA: 0.33 ng/mL (ref <=4.00)

## 2016-01-01 LAB — MICROALBUMIN / CREATININE URINE RATIO
Creatinine, Urine: 155 mg/dL (ref 20–370)
Microalb Creat Ratio: 8 mcg/mg creat (ref <30)
Microalb, Ur: 1.3 mg/dL

## 2016-01-01 LAB — INSULIN, RANDOM: Insulin: 8.2 u[IU]/mL (ref 2.0–19.6)

## 2016-01-03 ENCOUNTER — Other Ambulatory Visit: Payer: Self-pay | Admitting: Internal Medicine

## 2016-01-07 ENCOUNTER — Other Ambulatory Visit: Payer: Self-pay | Admitting: *Deleted

## 2016-01-07 DIAGNOSIS — Z1212 Encounter for screening for malignant neoplasm of rectum: Secondary | ICD-10-CM

## 2016-01-07 DIAGNOSIS — Z0001 Encounter for general adult medical examination with abnormal findings: Secondary | ICD-10-CM

## 2016-01-07 LAB — POC HEMOCCULT BLD/STL (HOME/3-CARD/SCREEN)
Card #2 Fecal Occult Blod, POC: NEGATIVE
FECAL OCCULT BLD: NEGATIVE
Fecal Occult Blood, POC: NEGATIVE

## 2016-02-20 ENCOUNTER — Other Ambulatory Visit: Payer: Self-pay | Admitting: Internal Medicine

## 2016-04-02 ENCOUNTER — Encounter: Payer: Self-pay | Admitting: Internal Medicine

## 2016-04-02 ENCOUNTER — Ambulatory Visit (INDEPENDENT_AMBULATORY_CARE_PROVIDER_SITE_OTHER): Payer: Medicare Other | Admitting: Internal Medicine

## 2016-04-02 VITALS — BP 124/60 | HR 88 | Temp 98.0°F | Resp 16 | Ht 68.0 in | Wt 170.0 lb

## 2016-04-02 DIAGNOSIS — E782 Mixed hyperlipidemia: Secondary | ICD-10-CM

## 2016-04-02 DIAGNOSIS — Z79899 Other long term (current) drug therapy: Secondary | ICD-10-CM

## 2016-04-02 DIAGNOSIS — I10 Essential (primary) hypertension: Secondary | ICD-10-CM

## 2016-04-02 DIAGNOSIS — R7303 Prediabetes: Secondary | ICD-10-CM

## 2016-04-02 DIAGNOSIS — E559 Vitamin D deficiency, unspecified: Secondary | ICD-10-CM | POA: Diagnosis not present

## 2016-04-02 DIAGNOSIS — M170 Bilateral primary osteoarthritis of knee: Secondary | ICD-10-CM

## 2016-04-02 NOTE — Progress Notes (Signed)
Assessment and Plan:  Hypertension:  -cont losartan well controlled -Continue medication,  -monitor blood pressure at home.  -Continue DASH diet.   -Reminder to go to the ER if any CP, SOB, nausea, dizziness, severe HA, changes vision/speech, left arm numbness and tingling, and jaw pain.  Cholesterol: -Continue diet and exercise.    Pre-diabetes: -Continue diet and exercise.   Vitamin D Def: -continue medications.   Osteoarthritis of the knee -cont mobic -take with meals -drink plenty of water with it  Continue diet and meds as discussed. Further disposition pending results of labs.  HPI 81 y.o. male  presents for 3 month follow up with hypertension, hyperlipidemia, prediabetes and vitamin D.   His blood pressure has been controlled at home, today their BP is BP: 124/60.   He does workout. He denies chest pain, shortness of breath, dizziness.   He is on cholesterol medication and denies myalgias. His cholesterol is at goal. The cholesterol last visit was:   Lab Results  Component Value Date   CHOL 167 12/31/2015   HDL 66 12/31/2015   LDLCALC 87 12/31/2015   TRIG 71 12/31/2015   CHOLHDL 2.5 12/31/2015     He has been working on diet and exercise for prediabetes, and denies foot ulcerations, hyperglycemia, hypoglycemia , increased appetite, nausea, paresthesia of the feet, polydipsia, polyuria, visual disturbances, vomiting and weight loss. Last A1C in the office was:  Lab Results  Component Value Date   HGBA1C 5.5 12/31/2015    Patient is on Vitamin D supplement.  Lab Results  Component Value Date   VD25OH 40 12/31/2015     He reports that he is having good relief with his recent knee replacement in February. He is still taking mobic for his other knee.  He reports that he gets good pain relief from this.  He is due to see Dr. Maureen Ralphs again in February.   Current Medications:  Current Outpatient Prescriptions on File Prior to Visit  Medication Sig Dispense Refill   . finasteride (PROSCAR) 5 MG tablet take 1 tablet by mouth once daily for prostate 90 tablet 99  . losartan (COZAAR) 100 MG tablet take 1 tablet by mouth once daily 90 tablet 1  . meloxicam (MOBIC) 15 MG tablet take 1 tablet by mouth once daily 90 tablet 1  . Polyethyl Glycol-Propyl Glycol (SYSTANE OP) Apply 1 drop to eye 2 (two) times daily as needed (Dry eyes).    . tamsulosin (FLOMAX) 0.4 MG CAPS capsule take 1 capsule by mouth once daily for prostate 90 capsule 1   No current facility-administered medications on file prior to visit.     Medical History:  Past Medical History:  Diagnosis Date  . Arthritis    oa  . Asthma    as child  . Hyperlipidemia   . Hypertension   . Measles    as child  . Mumps    as child    Allergies:  Allergies  Allergen Reactions  . Atenolol Other (See Comments)    "not sure"  . Pravastatin     Joint pain  . Simvastatin     Joint pain  . Sulfonamide Derivatives Swelling and Rash     Review of Systems:  ROS  Family history- Review and unchanged  Social history- Review and unchanged  Physical Exam: BP 124/60   Pulse 88   Temp 98 F (36.7 C) (Temporal)   Resp 16   Ht 5\' 8"  (1.727 m)   Wt 170  lb (77.1 kg)   BMI 25.85 kg/m  Wt Readings from Last 3 Encounters:  04/02/16 170 lb (77.1 kg)  12/31/15 166 lb 3.2 oz (75.4 kg)  11/04/15 178 lb (80.7 kg)    General Appearance: Well nourished well developed, in no apparent distress. Eyes: PERRLA, EOMs, conjunctiva no swelling or erythema ENT/Mouth: Ear canals normal without obstruction, swelling, erythma, discharge.  TMs normal bilaterally.  Oropharynx moist, clear, without exudate, or postoropharyngeal swelling. Neck: Supple, thyroid normal,no cervical adenopathy  Respiratory: Respiratory effort normal, Breath sounds clear A&P without rhonchi, wheeze, or rale.  No retractions, no accessory usage. Cardio: RRR with no MRGs. Brisk peripheral pulses without edema.  Abdomen: Soft, + BS,   Non tender, no guarding, rebound, hernias, masses. Musculoskeletal: Full ROM, 5/5 strength, Normal gait Skin: Warm, dry without rashes, lesions, ecchymosis.  Neuro: Awake and oriented X 3, Cranial nerves intact. Normal muscle tone, no cerebellar symptoms. Psych: Normal affect, Insight and Judgment appropriate.    Starlyn Skeans, PA-C 2:57 PM [facility]

## 2016-04-03 LAB — BASIC METABOLIC PANEL WITH GFR
BUN: 33 mg/dL — ABNORMAL HIGH (ref 7–25)
CALCIUM: 10.1 mg/dL (ref 8.6–10.3)
CO2: 22 mmol/L (ref 20–31)
Chloride: 110 mmol/L (ref 98–110)
Creat: 1.14 mg/dL (ref 0.70–1.18)
GFR, EST AFRICAN AMERICAN: 71 mL/min (ref >=60)
GFR, EST NON AFRICAN AMERICAN: 61 mL/min (ref >=60)
Glucose, Bld: 90 mg/dL (ref 65–99)
POTASSIUM: 4.7 mmol/L (ref 3.5–5.3)
SODIUM: 142 mmol/L (ref 135–146)

## 2016-06-02 ENCOUNTER — Other Ambulatory Visit: Payer: Self-pay | Admitting: Internal Medicine

## 2016-07-06 ENCOUNTER — Ambulatory Visit: Payer: Self-pay | Admitting: Internal Medicine

## 2016-07-07 ENCOUNTER — Other Ambulatory Visit: Payer: Self-pay | Admitting: Internal Medicine

## 2016-07-28 ENCOUNTER — Ambulatory Visit: Payer: Self-pay | Admitting: Internal Medicine

## 2016-07-28 ENCOUNTER — Ambulatory Visit (INDEPENDENT_AMBULATORY_CARE_PROVIDER_SITE_OTHER): Payer: Medicare Other | Admitting: Physician Assistant

## 2016-07-28 ENCOUNTER — Encounter: Payer: Self-pay | Admitting: Physician Assistant

## 2016-07-28 VITALS — BP 130/74 | HR 90 | Temp 98.0°F | Resp 16 | Ht 68.0 in | Wt 180.0 lb

## 2016-07-28 DIAGNOSIS — Z79899 Other long term (current) drug therapy: Secondary | ICD-10-CM

## 2016-07-28 DIAGNOSIS — N320 Bladder-neck obstruction: Secondary | ICD-10-CM

## 2016-07-28 DIAGNOSIS — Z0001 Encounter for general adult medical examination with abnormal findings: Secondary | ICD-10-CM | POA: Diagnosis not present

## 2016-07-28 DIAGNOSIS — Z0000 Encounter for general adult medical examination without abnormal findings: Secondary | ICD-10-CM

## 2016-07-28 DIAGNOSIS — R7303 Prediabetes: Secondary | ICD-10-CM | POA: Diagnosis not present

## 2016-07-28 DIAGNOSIS — I10 Essential (primary) hypertension: Secondary | ICD-10-CM | POA: Diagnosis not present

## 2016-07-28 DIAGNOSIS — R6889 Other general symptoms and signs: Secondary | ICD-10-CM

## 2016-07-28 DIAGNOSIS — E782 Mixed hyperlipidemia: Secondary | ICD-10-CM | POA: Diagnosis not present

## 2016-07-28 DIAGNOSIS — M1712 Unilateral primary osteoarthritis, left knee: Secondary | ICD-10-CM | POA: Diagnosis not present

## 2016-07-28 DIAGNOSIS — E559 Vitamin D deficiency, unspecified: Secondary | ICD-10-CM | POA: Diagnosis not present

## 2016-07-28 LAB — CBC WITH DIFFERENTIAL/PLATELET
BASOS PCT: 1 %
Basophils Absolute: 82 cells/uL (ref 0–200)
Eosinophils Absolute: 82 cells/uL (ref 15–500)
Eosinophils Relative: 1 %
HEMATOCRIT: 40.9 % (ref 38.5–50.0)
HEMOGLOBIN: 13.5 g/dL (ref 13.2–17.1)
LYMPHS ABS: 1476 {cells}/uL (ref 850–3900)
LYMPHS PCT: 18 %
MCH: 30.5 pg (ref 27.0–33.0)
MCHC: 33 g/dL (ref 32.0–36.0)
MCV: 92.3 fL (ref 80.0–100.0)
MONO ABS: 738 {cells}/uL (ref 200–950)
MPV: 8.4 fL (ref 7.5–12.5)
Monocytes Relative: 9 %
NEUTROS ABS: 5822 {cells}/uL (ref 1500–7800)
Neutrophils Relative %: 71 %
Platelets: 244 10*3/uL (ref 140–400)
RBC: 4.43 MIL/uL (ref 4.20–5.80)
RDW: 13.4 % (ref 11.0–15.0)
WBC: 8.2 10*3/uL (ref 3.8–10.8)

## 2016-07-28 LAB — HEPATIC FUNCTION PANEL
ALK PHOS: 66 U/L (ref 40–115)
ALT: 14 U/L (ref 9–46)
AST: 16 U/L (ref 10–35)
Albumin: 4.2 g/dL (ref 3.6–5.1)
BILIRUBIN INDIRECT: 0.3 mg/dL (ref 0.2–1.2)
Bilirubin, Direct: 0.1 mg/dL (ref <=0.2)
TOTAL PROTEIN: 6.4 g/dL (ref 6.1–8.1)
Total Bilirubin: 0.4 mg/dL (ref 0.2–1.2)

## 2016-07-28 LAB — MAGNESIUM: Magnesium: 1.8 mg/dL (ref 1.5–2.5)

## 2016-07-28 LAB — BASIC METABOLIC PANEL WITH GFR
BUN: 20 mg/dL (ref 7–25)
CO2: 23 mmol/L (ref 20–31)
Calcium: 9.4 mg/dL (ref 8.6–10.3)
Chloride: 106 mmol/L (ref 98–110)
Creat: 0.94 mg/dL (ref 0.70–1.18)
GFR, EST AFRICAN AMERICAN: 89 mL/min (ref >=60)
GFR, EST NON AFRICAN AMERICAN: 77 mL/min (ref >=60)
GLUCOSE: 97 mg/dL (ref 65–99)
POTASSIUM: 4.3 mmol/L (ref 3.5–5.3)
Sodium: 140 mmol/L (ref 135–146)

## 2016-07-28 NOTE — Patient Instructions (Signed)
Squamous Cell Carcinoma  Squamous cell carcinoma is a common form of skin cancer. It begins in the squamous cells in the outer layer of the skin (epidermis). It occurs most often in parts of the body that are frequently exposed to the sun, such as the face, lips, neck, arms, legs, and hands. However, this condition can occur anywhere on the body, including the inside of the mouth, sites of long-term (chronic) scarring, and the anus.  If squamous cell carcinoma is treated soon enough, it rarely spreads to other areas of the body (metastasizes). If it is not treated, it can destroy nearby tissues. In rare cases, it can spread to other areas.  What are the causes?  This condition is usually caused by exposure to ultraviolet (UV) light. UV light may come from the sun or from tanning beds. Other causes include:   Exposure to arsenic.   Exposure to radiation.   Exposure to toxic tars and oils.   Certain genetic conditions, such as xeroderma pigmentosum.  What increases the risk?  This condition is more likely to develop in:   People who are older than 79 years of age.   People who have fair skin (light complexion).   People who have blonde or red hair.   People who have blue, green, or gray eyes.   People who have childhood freckling.   People who have had sun exposure over long periods of time, especially during childhood.   People who have had repeated sunburns.   People who have a weakened immune system.   People who have been exposed to certain chemicals, such as tar, soot, and arsenic.   People who have chronic inflammatory conditions.   People who have chronic infections.   People who have an HPV (human papillomavirus) infection.   People who have conditions that cause chronic scarring. These can include burn scars, chronic ulcers, heat (thermal) injuries, and radiation.   People who have had psoralen and ultraviolet A (PUVA) treatments.   People who smoke.   People who use tanning beds.  What  are the signs or symptoms?  This condition often starts as small pink or brown growths on the skin. The growths have a rough surface that may feel like sandpaper. In some cases, the growths are easier to feel than to see. The growths may develop into a sore that does not heal.  How is this diagnosed?  This condition may be diagnosed with:   A physical exam.   Removal of a tissue sample to be examined under a microscope (biopsy).  How is this treated?  Treatment for this condition involves removing the cancerous tissue. The method that is used for this depends on the size and location of the tumors, as well as your overall health. Possible treatments include:   Mohs surgery. In this procedure, the cancerous skin cells are removed layer by layer until all of the tumor has been removed.   Surgical removal (excision) of the tumor. This involves removing the entire tumor and a small amount of normal skin that surrounds it.   Cryosurgery. This involves freezing the tumor with liquid nitrogen.   Plastic surgery. The tumor is removed, and healthy skin from another part of the body is used to cover the wound. This may be done for large tumors that are in areas where it is not possible to stretch the nearby skin to sew the edges of the wound together.   Radiation. This may be used for tumors   current to control bleeding. Follow these instructions at home:  Avoid unprotected sun exposure.  Do self-exams as told by your health care provider. Look for new spots or changes in your skin.  Keep all follow-up visits as told by your health care provider. This is important.  Do not use tobacco products, including cigarettes, chewing tobacco, or e-cigarettes. If you need help  quitting, ask your health care provider. How is this prevented?  Avoid the sun when it is the strongest. This is usually between 10:00 a.m. and 4:00 p.m.  When you are out in the sun, use a sunscreen that has a sun protection factor (SPF) of at least 21.  Apply sunscreen at least 30 minutes before exposure to the sun.  Reapply sunscreen every 2-4 hours while you are outside. Also reapply it after swimming and after excessive sweating.  Always wear hats, protective clothing, and UV-blocking sunglasses when you are outdoors.  Do not use tanning beds. Contact a health care provider if:  You notice any new growths or any changes in your skin.  You have had a squamous cell carcinoma tumor removed and you notice a new growth in the same location. This information is not intended to replace advice given to you by your health care provider. Make sure you discuss any questions you have with your health care provider. Document Released: 02/28/2003 Document Revised: 04/21/2016 Document Reviewed: 12/17/2014 Elsevier Interactive Patient Education  2017 [place] Carcinoma Introduction Basal cell carcinoma is the most common form of skin cancer. It begins in the basal cells, which are at the bottom of the outer skin layer (epidermis). It occurs most often on parts of the body that are frequently exposed to the sun, such as:  Parts of the head, including the scalp or face.  Ears.  Neck.  Arms or legs.  Backs of the hands. Basal cell carcinoma can almost always be cured. It rarely spreads to other areas of the body (metastasizes). Basal cell carcinoma may come back at the same location (recur), but it can be treated again if this occurs. What are the causes? This condition is usually caused by exposure to ultraviolet (UV) light. UV light may come from the sun or from tanning beds. Other causes include:  Exposure to arsenic.  Exposure to radiation.  Exposure to toxic tars  and oils.  Certain genetic conditions, such as xeroderma pigmentosum. What increases the risk? This condition is more likely to develop in:  People who are older than 79 years of age.  People who have fair skin (light complexion).  People who have blonde or red hair.  People who have blue, green, or gray eyes.  People who have childhood freckling.  People who have had sun exposure over long periods of time, especially during childhood.  People who have had repeated sunburns.  People who have a weakened immune system.  People who have been exposed to certain chemicals, such as tar, soot, and arsenic.  People who have chronic inflammatory conditions.  People who have chronic infections.  People who use tanning beds. What are the signs or symptoms? The main symptom of this condition is a growth or lesion on the skin. The shape and color of the growth or lesion may vary. The five main types include:  An open sore that may remain open for 3 weeks or longer. The sore may bleed or crust. This type of lesion can be an early sign of basal cell carcinoma. Basal cell  carcinoma often shows up as a sore that does not heal.  A reddish area that may crust, itch, or cause discomfort. This may occur on areas that are exposed to the sun. These patches might be easier to feel than to see.  A shiny or clear bump that is red, white, or pink. In people who have dark hair, the bump is often tan, black, or brown. These bumps can look like moles.  A pink growth with a raised border. The growth will have a crusted and indented area in the center. Small blood vessels may appear on the surface of the growth as it gets bigger.  A scarlike area that looks like shiny, stretched skin. The area may be white, yellow, or waxy. It often has irregular borders. This may be a sign of more aggressive basal cell carcinoma. How is this diagnosed? This condition may be diagnosed with:  A physical exam.  Removal  of a tissue sample to be examined under a microscope (biopsy). How is this treated? Treatment for this condition involves removing the cancerous tissue. The method that is used for this depends on the type, size, location, and number of tumors. Possible treatments include:  Mohs surgery. In this procedure, the cancerous skin cells are removed layer by layer until all of the tumor has been removed.  Surgical removal (excision) of the tumor. This involves removing the entire tumor and a small amount of normal skin that surrounds it.  Cryosurgery. This involves freezing the tumor with liquid nitrogen.  Plastic surgery. The tumor is removed, and healthy skin from another part of the body is used to cover the wound. This may be done for large tumors that are in areas where it is not possible to stretch the nearby skin to sew the edges of the wound together.  Radiation. This may be used for tumors on the face.  Photodynamic therapy. A chemical cream is applied to the skin, and light exposure is used to activate the chemical.  Electrodesiccation and curettage. This involves alternately scraping and burning the tumor while using an electric current to control bleeding.  Chemical treatments, such as imiquimod cream and interferon injections. These may be used to remove superficial tumors with minimal scarring. Follow these instructions at home:  Avoid unprotected sun exposure.  Do self-exams as told by your health care provider. Look for new spots or changes in your skin.  Keep all follow-up visits as told by your health care provider. This is important. How is this prevented?  Avoid the sun when it is the strongest. This is usually between 10:00 a.m. and 4:00 p.m.  When you are out in the sun, use a sunscreen that has a sun protection factor (SPF) of at least 101.  Apply sunscreen at least 30 minutes before exposure to the sun.  Reapply sunscreen every 2-4 hours while you are outside. Also  reapply it after swimming and after excessive sweating.  Always wear hats, protective clothing, and UV-blocking sunglasses when you are outdoors.  Do not use tanning beds. Contact a health care provider if:  You notice any new spots or any changes in your skin.  You have had a basal cell carcinoma tumor removed and you notice a new growth in the same location. This information is not intended to replace advice given to you by your health care provider. Make sure you discuss any questions you have with your health care provider. Document Released: 02/28/2003 Document Revised: 01/30/2016 Document Reviewed: 12/17/2014  2017 Elsevier

## 2016-07-28 NOTE — Progress Notes (Signed)
MEDICARE ANNUAL WELLNESS VISIT AND FOLLOW UP UHC Assessment:    Essential hypertension - continue medications, DASH diet, exercise and monitor at home. Call if greater than 130/80.   Hyperlipidemia -continue medications, check lipids, decrease fatty foods, increase activity.    Vitamin D deficiency Continue supplement  Prediabetes Discussed general issues about diabetes pathophysiology and management., Educational material distributed., Suggested low cholesterol diet., Encouraged aerobic exercise., Discussed foot care., Reminded to get yearly retinal exam.   Encounter for long-term (current) use of medications - Magnesium   Bilateral knee pain s/p replacement 9 months ago Follow up with Dr. Elmyra Ricks, continue mobic   BPH/obstruction Continue medications  Future Appointments Date Time Provider [place]  01/27/2017 9:00 AM Unk Pinto, MD [facility] None     Plan:   During the course of the visit the patient was educated and counseled about appropriate screening and preventive services including:    Pneumococcal vaccine   Influenza vaccine  Td vaccine  Screening electrocardiogram  Colorectal cancer screening  Diabetes screening  Glaucoma screening  Nutrition counseling     Subjective:  Carlos Baldwin is a 81 y.o. male who presents for Medicare Annual Wellness Visit and 3 month follow up for HTN, hyperlipidemia, prediabetes, and vitamin D Def.   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  He denies chest pain, shortness of breath, dizziness.  He is not on cholesterol medication and denies myalgias. His cholesterol is at goal. The cholesterol last visit was:   Lab Results  Component Value Date   CHOL 167 12/31/2015   HDL 66 12/31/2015   LDLCALC 87 12/31/2015   TRIG 71 12/31/2015   CHOLHDL 2.5 12/31/2015    He has been working on diet and exercise for prediabetes, and denies paresthesia of the feet, polydipsia and polyuria. Last A1C in the office was:   Lab Results  Component Value Date   HGBA1C 5.5 12/31/2015   Patient is on Vitamin D supplement.   Lab Results  Component Value Date   VD25OH 41 12/31/2015  He is on proscar and flomax for BMP which helps.    Has history of asthma but has not had an issues in years, no albuterol inhaler.  BMI is Body mass index is 27.37 kg/m., he is working on diet and exercise. Wt Readings from Last 3 Encounters:  07/28/16 180 lb (81.6 kg)  04/02/16 170 lb (77.1 kg)  12/31/15 166 lb 3.2 oz (75.4 kg)    Medication Review: Current Outpatient Prescriptions on File Prior to Visit  Medication Sig Dispense Refill  . finasteride (PROSCAR) 5 MG tablet take 1 tablet by mouth once daily for prostate 90 tablet 0  . losartan (COZAAR) 100 MG tablet take 1 tablet by mouth once daily 90 tablet 1  . meloxicam (MOBIC) 15 MG tablet take 1 tablet by mouth once daily 90 tablet 1  . Polyethyl Glycol-Propyl Glycol (SYSTANE OP) Apply 1 drop to eye 2 (two) times daily as needed (Dry eyes).    . tamsulosin (FLOMAX) 0.4 MG CAPS capsule take 1 capsule by mouth once daily for prostate 90 capsule 1   No current facility-administered medications on file prior to visit.    Review of Systems  Constitutional: Negative for chills, diaphoresis, fever, malaise/fatigue and weight loss.  HENT: Negative for ear discharge, ear pain, hearing loss,  nosebleeds and tinnitus. Sore throat: got better.   Eyes: Negative.   Respiratory: Negative for cough, hemoptysis, sputum production, shortness of breath, wheezing and stridor.   Cardiovascular: Negative.   Gastrointestinal: Negative.   Genitourinary: Negative.   Musculoskeletal: Positive for joint pain (right knee, left knee better since replacement). Negative for back pain, falls, myalgias and neck pain.  Skin: Negative.   Neurological:  Negative.  Negative for weakness and headaches.  Psychiatric/Behavioral: Negative.     Current Problems (verified) Patient Active Problem List   Diagnosis Date Noted  . Encounter for Medicare annual wellness exam 07/28/2016  . OA (osteoarthritis) of knee 11/04/2015  . Prediabetes 12/21/2013  . Medication management 12/21/2013  . Essential hypertension 08/02/2013  . Hyperlipidemia 08/02/2013  . Vitamin D deficiency 08/02/2013  . Bladder neck obstruction 08/02/2013    Screening Tests Immunization History  Administered Date(s) Administered  . Pneumococcal Conjugate-13 06/27/2014  . Pneumococcal Polysaccharide-23 09/07/2006  . Zoster 09/07/2008   Preventative care: Last colonoscopy: 2010 [place] AB 12/2011 CXR 2002  Prior vaccinations: TD or Tdap: 2010  Influenza: 2017 with the city Pneumococcal: 2008 Prevnar: 2015 Shingles/Zostavax: 2010  Names of Other Physician/Practitioners you currently use: 1.  [facility] here for primary care Patient Care Team: Unk Pinto, MD as PCP - General (Internal Medicine) Garald Balding, MD as Consulting Physician (Orthopedic Surgery) Ladene Artist, MD as Consulting Physician (Gastroenterology) Posey Pronto, DDS as Consulting Physician (Dentistry) Roselie Awkward, MD as Consulting Physician (Ophthalmology)- 06/2015 has OV coming up  Allergies Allergies  Allergen Reactions  . Atenolol Other (See Comments)    "not sure"  . Pravastatin     Joint pain  . Simvastatin     Joint pain  . Sulfonamide Derivatives Swelling and Rash    SURGICAL HISTORY He  has a past surgical history that includes Knee surgery (Right, 1972); left knee arthroscopy (01-14-2012); and Total knee arthroplasty (Left, 11/04/2015). FAMILY HISTORY His family history is not on file. - mother/father passed in 90's no history, siblings healthy SOCIAL HISTORY He  reports that he has never smoked. He has never used smokeless  tobacco. He reports that he does not drink alcohol or use drugs.  MEDICARE WELLNESS OBJECTIVES: Tobacco use: He does not smoke.  Patient is not a former smoker. Alcohol Current alcohol use: none Caffeine Current caffeine use: coffee 1 /day Osteoporosis: dietary calcium and/or vitamin D deficiency, History of fracture in the past year: no Diet: in general, a "healthy" diet   Physical activity: walking and exercise class Depression/mood screen:  Yes - No Depression Hearing: normal Visual acuity: normal,  does perform annual eye exam  ADLs: self care Fall risk: Minimal risk Home safety: excellent Cognitive Testing  Alert? Yes  Normal Appearance?Yes  Oriented to person? Yes  Place? Yes   Time? Yes  Recall of three objects?  Yes  Can perform simple calculations? Yes  Displays appropriate judgment?Yes  Can read the correct time from a watch face?Yes EOL planning: Yes   Objective:   Blood pressure 130/74, pulse 90, temperature 98 F (36.7 C), temperature source Temporal, resp. rate 16, height 5\' 8"  (1.727 m), weight 180 lb (81.6 kg). Body mass index is 27.37 kg/m.  General appearance: alert, no distress, WD/WN, male HEENT: normocephalic, sclerae anicteric, TMs pearly, nares patent, no discharge or erythema, pharynx normal Oral cavity: MMM, no lesions Neck: supple, no lymphadenopathy, no thyromegaly, no masses Heart: RRR, normal S1, S2, no murmurs Lungs: CTA bilaterally, no  wheezes, rhonchi, or rales Abdomen: +bs, soft, non tender, non distended, no masses, no hepatomegaly, no splenomegaly Musculoskeletal: nontender, no swelling, no obvious deformity Extremities: no edema, no cyanosis, no clubbing Pulses: 2+ symmetric, upper and lower extremities, normal cap refill Neurological: alert, oriented x 3, CN2-12 intact, strength normal upper extremities and lower extremities, sensation normal throughout, DTRs 2+ throughout, no cerebellar signs, gait antalgic due to knees Psychiatric:  normal affect, behavior normal, pleasant   Medicare Attestation I have personally reviewed: The patient's medical and social history Their use of alcohol, tobacco or illicit drugs Their current medications and supplements The patient's functional ability including ADLs,fall risks, home safety risks, cognitive, and hearing and visual impairment Diet and physical activities Evidence for depression or mood disorders  The patient's weight, height, BMI, and visual acuity have been recorded in the chart.  I have made referrals, counseling, and provided education to the patient based on review of the above and I have provided the patient with a written personalized care plan for preventive services.     Vicie Mutters, PA-C   07/28/2016

## 2016-08-15 ENCOUNTER — Other Ambulatory Visit: Payer: Self-pay | Admitting: Internal Medicine

## 2016-09-09 ENCOUNTER — Ambulatory Visit (INDEPENDENT_AMBULATORY_CARE_PROVIDER_SITE_OTHER): Payer: Medicare Other | Admitting: Internal Medicine

## 2016-09-09 ENCOUNTER — Encounter: Payer: Self-pay | Admitting: Internal Medicine

## 2016-09-09 VITALS — BP 126/64 | HR 90 | Temp 98.2°F | Resp 16 | Ht 68.0 in | Wt 185.0 lb

## 2016-09-09 DIAGNOSIS — J069 Acute upper respiratory infection, unspecified: Secondary | ICD-10-CM

## 2016-09-09 MED ORDER — PREDNISONE 20 MG PO TABS: ORAL_TABLET | ORAL | 0 refills | Status: DC

## 2016-09-09 MED ORDER — FLUTICASONE PROPIONATE 50 MCG/ACT NA SUSP: 2.0000 | Freq: Every day | NASAL | 0 refills | Status: DC

## 2016-09-09 MED ORDER — PROMETHAZINE-DM 6.25-15 MG/5ML PO SYRP: ORAL_SOLUTION | ORAL | 1 refills | Status: DC

## 2016-09-09 NOTE — Progress Notes (Signed)
HPI  Patient presents to the office for evaluation of cough and congestion.  It has been going on for 3 weeks.  Patient reports night > day, dry, barky, worse with lying down.  They also endorse change in voice, postnasal drip, shortness of breath and nasal congestion, clear rhinorrhea, mild congestion, sore throat, mild myalgia. .  They have tried mucinex with some mild relief.  They report that nothing has worked.  They denies other sick contacts.  Review of Systems  Constitutional: Positive for malaise/fatigue. Negative for chills and fever.  HENT: Positive for congestion, ear pain, hearing loss and sore throat.   Respiratory: Positive for cough. Negative for sputum production, shortness of breath and wheezing.   Cardiovascular: Negative for chest pain, palpitations and leg swelling.  Neurological: Positive for headaches.    PE:  Vitals:   09/09/16 1439  BP: 126/64  Pulse: 90  Resp: 16  Temp: 98.2 F (36.8 C)     General:  Alert and non-toxic, WDWN, NAD HEENT: NCAT, PERLA, EOM normal, no occular discharge or erythema.  Nasal mucosal edema with sinus tenderness to palpation.  Oropharynx clear with minimal oropharyngeal edema and erythema.  Mucous membranes moist and pink. Neck:  Cervical adenopathy Chest:  RRR no MRGs.  Lungs clear to auscultation A&P with no wheezes rhonchi or rales.   Abdomen: +BS x 4 quadrants, soft, non-tender, no guarding, rigidity, or rebound. Skin: warm and dry no rash Neuro: A&Ox4, CN II-XII grossly intact  Assessment and Plan:  Acute uri -flonase -prednisone -phenergan dm -daily claritin zyrtec or allegra -nasal saline -patient has albuterol at home, use q6hrs.

## 2016-09-21 DIAGNOSIS — H2513 Age-related nuclear cataract, bilateral: Secondary | ICD-10-CM | POA: Diagnosis not present

## 2016-09-21 DIAGNOSIS — H52203 Unspecified astigmatism, bilateral: Secondary | ICD-10-CM | POA: Diagnosis not present

## 2016-11-19 DIAGNOSIS — M1711 Unilateral primary osteoarthritis, right knee: Secondary | ICD-10-CM | POA: Diagnosis not present

## 2016-11-19 DIAGNOSIS — Z96652 Presence of left artificial knee joint: Secondary | ICD-10-CM | POA: Diagnosis not present

## 2016-11-19 DIAGNOSIS — Z471 Aftercare following joint replacement surgery: Secondary | ICD-10-CM | POA: Diagnosis not present

## 2016-11-23 ENCOUNTER — Other Ambulatory Visit: Payer: Self-pay | Admitting: Physician Assistant

## 2017-01-18 ENCOUNTER — Other Ambulatory Visit: Payer: Self-pay | Admitting: Internal Medicine

## 2017-01-27 ENCOUNTER — Ambulatory Visit (INDEPENDENT_AMBULATORY_CARE_PROVIDER_SITE_OTHER): Payer: Medicare Other | Admitting: Internal Medicine

## 2017-01-27 ENCOUNTER — Encounter: Payer: Self-pay | Admitting: Internal Medicine

## 2017-01-27 VITALS — BP 128/78 | HR 76 | Temp 97.7°F | Resp 16 | Ht 69.0 in | Wt 180.4 lb

## 2017-01-27 DIAGNOSIS — Z0001 Encounter for general adult medical examination with abnormal findings: Secondary | ICD-10-CM

## 2017-01-27 DIAGNOSIS — E782 Mixed hyperlipidemia: Secondary | ICD-10-CM

## 2017-01-27 DIAGNOSIS — Z136 Encounter for screening for cardiovascular disorders: Secondary | ICD-10-CM | POA: Diagnosis not present

## 2017-01-27 DIAGNOSIS — Z0000 Encounter for general adult medical examination without abnormal findings: Secondary | ICD-10-CM

## 2017-01-27 DIAGNOSIS — M170 Bilateral primary osteoarthritis of knee: Secondary | ICD-10-CM

## 2017-01-27 DIAGNOSIS — I10 Essential (primary) hypertension: Secondary | ICD-10-CM

## 2017-01-27 DIAGNOSIS — R7303 Prediabetes: Secondary | ICD-10-CM

## 2017-01-27 DIAGNOSIS — E559 Vitamin D deficiency, unspecified: Secondary | ICD-10-CM | POA: Diagnosis not present

## 2017-01-27 DIAGNOSIS — Z79899 Other long term (current) drug therapy: Secondary | ICD-10-CM

## 2017-01-27 DIAGNOSIS — Z125 Encounter for screening for malignant neoplasm of prostate: Secondary | ICD-10-CM

## 2017-01-27 DIAGNOSIS — N320 Bladder-neck obstruction: Secondary | ICD-10-CM | POA: Diagnosis not present

## 2017-01-27 DIAGNOSIS — Z1212 Encounter for screening for malignant neoplasm of rectum: Secondary | ICD-10-CM

## 2017-01-27 LAB — LIPID PANEL
CHOL/HDL RATIO: 2.7 ratio (ref <5.0)
Cholesterol: 152 mg/dL (ref <200)
HDL: 57 mg/dL (ref >40)
LDL CALC: 84 mg/dL (ref <100)
Triglycerides: 55 mg/dL (ref <150)
VLDL: 11 mg/dL (ref <30)

## 2017-01-27 LAB — HEPATIC FUNCTION PANEL
ALBUMIN: 4 g/dL (ref 3.6–5.1)
ALK PHOS: 64 U/L (ref 40–115)
ALT: 13 U/L (ref 9–46)
AST: 16 U/L (ref 10–35)
BILIRUBIN TOTAL: 0.4 mg/dL (ref 0.2–1.2)
Bilirubin, Direct: 0.1 mg/dL (ref <=0.2)
Indirect Bilirubin: 0.3 mg/dL (ref 0.2–1.2)
Total Protein: 6.1 g/dL (ref 6.1–8.1)

## 2017-01-27 LAB — BASIC METABOLIC PANEL WITH GFR
BUN: 25 mg/dL (ref 7–25)
CHLORIDE: 107 mmol/L (ref 98–110)
CO2: 24 mmol/L (ref 20–31)
CREATININE: 1.05 mg/dL (ref 0.70–1.18)
Calcium: 9.2 mg/dL (ref 8.6–10.3)
GFR, Est African American: 78 mL/min (ref >=60)
GFR, Est Non African American: 67 mL/min (ref >=60)
Glucose, Bld: 83 mg/dL (ref 65–99)
POTASSIUM: 4.2 mmol/L (ref 3.5–5.3)
Sodium: 141 mmol/L (ref 135–146)

## 2017-01-27 LAB — CBC WITH DIFFERENTIAL/PLATELET
BASOS ABS: 67 {cells}/uL (ref 0–200)
Basophils Relative: 1 %
EOS ABS: 268 {cells}/uL (ref 15–500)
Eosinophils Relative: 4 %
HCT: 41.3 % (ref 38.5–50.0)
Hemoglobin: 13.3 g/dL (ref 13.2–17.1)
LYMPHS PCT: 22 %
Lymphs Abs: 1474 cells/uL (ref 850–3900)
MCH: 29.2 pg (ref 27.0–33.0)
MCHC: 32.2 g/dL (ref 32.0–36.0)
MCV: 90.8 fL (ref 80.0–100.0)
MONOS PCT: 13 %
MPV: 9 fL (ref 7.5–12.5)
Monocytes Absolute: 871 cells/uL (ref 200–950)
NEUTROS PCT: 60 %
Neutro Abs: 4020 cells/uL (ref 1500–7800)
PLATELETS: 217 10*3/uL (ref 140–400)
RBC: 4.55 MIL/uL (ref 4.20–5.80)
RDW: 14.1 % (ref 11.0–15.0)
WBC: 6.7 10*3/uL (ref 3.8–10.8)

## 2017-01-27 NOTE — Progress Notes (Signed)
[facility]   Unk Pinto, M.D.      Uvaldo Bristle. Silverio Lay, P.A.-C  [facility]                [address] [place] [address] Telephone [phone] Telefax [phone] Annual  Screening/Preventative Visit  & Comprehensive Evaluation & Examination     This very nice 82 y.o. MWM presents for a Screening/Preventative Visit & comprehensive evaluation and management of multiple medical co-morbidities.  Patient has been followed for HTN, Prediabetes, Hyperlipidemia and Vitamin D Deficiency.     HTN predates since 2003. Patient's BP has been controlled at home.  Today's BP is at goal - 128/78. Patient denies any cardiac symptoms as chest pain, palpitations, shortness of breath, dizziness or ankle swelling.     Patient's hyperlipidemia is controlled with diet and medications. Patient denies myalgias or other medication SE's. Last lipids were at goal: Lab Results  Component Value Date   CHOL 167 12/31/2015   HDL 66 12/31/2015   LDLCALC 87 12/31/2015   TRIG 71 12/31/2015   CHOLHDL 2.5 12/31/2015      Patient has prediabetes (A1c 6.1% in 2011) and patient denies reactive hypoglycemic symptoms, visual blurring, diabetic polys or paresthesias. Last A1c was at goal: Lab Results  Component Value Date   HGBA1C 5.5 12/31/2015       Finally, patient has history of Vitamin D Deficiency ("35" in 2008) and last vitamin D was still low (goal 70-100): Lab Results  Component Value Date   VD25OH 41 12/31/2015   Current Outpatient Prescriptions on File Prior to Visit  Medication Sig  . finasteride  5 MG take 1 tablet by mouth once daily for prostate  . FLONASE nasal spray Place 2 sprays into both nostrils daily.  Marland Kitchen losartan  100 MG  take 1 tablet by mouth once daily  . meloxicam  15 MG  take 1 tablet by mouth once daily  . SYSTANE Apply 1 drop to eye 2 (two) times daily as needed (Dry eyes).  . tamsulosin   0.4 MG CAPS  take 1 capsule by mouth once daily for PROSTATE   Allergies  Allergen Reactions  . Atenolol Other (See Comments)    "not sure"  . Pravastatin     Joint pain  . Simvastatin     Joint pain  . Sulfonamide Derivatives Swelling and Rash   Past Medical History:  Diagnosis Date  . Arthritis    oa  . Asthma    as child  . Hyperlipidemia   . Hypertension   . Measles    as child  . Mumps    as child   Health Maintenance  Topic Date Due  . INFLUENZA VACCINE  04/07/2017  . TETANUS/TDAP  02/21/2019  . PNA vac Low Risk Adult  Completed   Immunization History  Administered Date(s) Administered  . Pneumococcal Conjugate-13 06/27/2014  . Pneumococcal Polysaccharide-23 09/07/2006  . Zoster 09/07/2008   Past Surgical History:  Procedure Laterality Date  . KNEE SURGERY Right 1972   cartlidge repair  . left knee arthroscopy  01-14-2012  . TOTAL KNEE ARTHROPLASTY Left 11/04/2015   Procedure: LEFT TOTAL KNEE ARTHROPLASTY;  Surgeon: Gaynelle Arabian, MD;  Location: [facility];  Service: Orthopedics;  Laterality: Left;   Social History   Social History  . Marital status: Married    Spouse name: N/A  .  Number of children: N/A  . Years of education: N/A   Occupational History  . 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 Smoking status: Never Smoker  . Smokeless tobacco: Never Used  . Alcohol use No  . Drug use: No  . Sexual activity: Not on file    ROS Constitutional: Denies fever, chills, weight loss/gain, headaches, insomnia,  night sweats or change in appetite. Does c/o fatigue. Eyes: Denies redness, blurred vision, diplopia, discharge, itchy or watery eyes.  ENT: Denies discharge, congestion, post nasal drip, epistaxis, sore throat, earache, hearing loss, dental pain, Tinnitus, Vertigo, Sinus pain or snoring.  Cardio: Denies chest pain, palpitations, irregular heartbeat, syncope, dyspnea, diaphoresis, orthopnea, PND,  claudication or edema Respiratory: denies cough, dyspnea, DOE, pleurisy, hoarseness, laryngitis or wheezing.  Gastrointestinal: Denies dysphagia, heartburn, reflux, water brash, pain, cramps, nausea, vomiting, bloating, diarrhea, constipation, hematemesis, melena, hematochezia, jaundice or hemorrhoids Genitourinary: Denies dysuria, frequency, urgency, nocturia, hesitancy, discharge, hematuria or flank pain Musculoskeletal: Denies arthralgia, myalgia, stiffness, Jt. Swelling, pain, limp or strain/sprain. Denies Falls. Skin: Denies puritis, rash, hives, warts, acne, eczema or change in skin lesion Neuro: No weakness, tremor, incoordination, spasms, paresthesia or pain Psychiatric: Denies confusion, memory loss or sensory loss. Denies Depression. Endocrine: Denies change in weight, skin, hair change, nocturia, and paresthesia, diabetic polys, visual blurring or hyper / hypo glycemic episodes.  Heme/Lymph: No excessive bleeding, bruising or enlarged lymph nodes.  Physical Exam  BP 128/78   Pulse 76   Temp 97.7 F (36.5 C)   Resp 16   Ht 5\' 9"  (1.753 m)   Wt 180 lb 6.4 oz (81.8 kg)   BMI 26.64 kg/m   General Appearance: Well nourished and well groomed appearing much younger than chronological age and in no apparent distress.  Eyes: PERRLA, EOMs, conjunctiva no swelling or erythema, normal fundi and vessels. Sinuses: No frontal/maxillary tenderness ENT/Mouth: EACs patent / TMs  nl. Nares clear without erythema, swelling, mucoid exudates. Oral hygiene is good. No erythema, swelling, or exudate. Tongue normal, non-obstructing. Tonsils not swollen or erythematous. Hearing normal.  Neck: Supple, thyroid normal. No bruits, nodes or JVD. Respiratory: Respiratory effort normal.  BS equal and clear bilateral without rales, rhonci, wheezing or stridor. Cardio: Heart sounds are normal with regular rate and rhythm and no murmurs, rubs or gallops. Peripheral pulses are normal and equal bilaterally  without edema. No aortic or femoral bruits. Chest: symmetric with normal excursions and percussion.  Abdomen: Soft, with Nl bowel sounds. Nontender, no guarding, rebound, hernias, masses, or organomegaly.  Lymphatics: Non tender without lymphadenopathy.  Musculoskeletal: Full ROM all peripheral extremities, joint stability, 5/5 strength, and normal gait. Skin: Warm and dry without rashes, lesions, cyanosis, clubbing or  ecchymosis.  Neuro: Cranial nerves intact, reflexes equal bilaterally. Normal muscle tone, no cerebellar symptoms. Sensation intact.  Pysch: Alert and oriented X 3 with normal affect, insight and judgment appropriate.   Assessment and Plan  1. Annual Preventative/Screening Exam    2. Essential hypertension  - EKG 12-Lead - [place], RETROPERITNL ABD,  LTD - Urinalysis, Routine w reflex microscopic - Microalbumin / creatinine urine ratio - CBC with Differential/Platelet - BASIC METABOLIC PANEL WITH GFR - Magnesium - TSH  3. Hyperlipidemia, mixed  - EKG 12-Lead - Hepatic function panel - Lipid panel - TSH  4. Prediabetes  - EKG 12-Lead - [place], RETROPERITNL ABD,  LTD - Hemoglobin A1c - Insulin, random  5. Vitamin D deficiency  - VITAMIN D  25 Hydroxy   6. Primary osteoarthritis of both knees   7. Screening for rectal cancer  - POC Hemoccult Bld/Stl  8. Prostate cancer screening  - PSA  9. Bladder outlet obstruction  - PSA  10. Screening for AAA (aortic abdominal aneurysm)  - [place], RETROPERITNL ABD,  LTD  11. Screening for ischemic heart disease  - EKG 12-Lead  12. Medication management  - Urinalysis, Routine w reflex microscopic - Microalbumin / creatinine urine ratio - CBC with Differential/Platelet - BASIC METABOLIC PANEL WITH GFR - Hepatic function panel - Magnesium - Lipid panel - TSH - Hemoglobin A1c - Insulin, random - VITAMIN D 25 Hydroxy        Patient was counseled in prudent diet, weight control to achieve/maintain BMI less  than 25, BP monitoring, regular exercise and medications as discussed.  Discussed med effects and SE's. Routine screening labs and tests as requested with regular follow-up as recommended. Over 40 minutes of exam, counseling, chart review and high complex critical decision making was performed

## 2017-01-27 NOTE — Patient Instructions (Signed)

## 2017-01-28 LAB — TSH: TSH: 1.96 m[IU]/L (ref 0.40–4.50)

## 2017-01-28 LAB — MAGNESIUM: Magnesium: 1.8 mg/dL (ref 1.5–2.5)

## 2017-01-28 LAB — URINALYSIS, ROUTINE W REFLEX MICROSCOPIC
Bilirubin Urine: NEGATIVE
Glucose, UA: NEGATIVE
HGB URINE DIPSTICK: NEGATIVE
Ketones, ur: NEGATIVE
LEUKOCYTES UA: NEGATIVE
Nitrite: NEGATIVE
PH: 5 (ref 5.0–8.0)
PROTEIN: NEGATIVE
Specific Gravity, Urine: 1.026 (ref 1.001–1.035)

## 2017-01-28 LAB — VITAMIN D 25 HYDROXY (VIT D DEFICIENCY, FRACTURES): Vit D, 25-Hydroxy: 55 ng/mL (ref 30–100)

## 2017-01-28 LAB — MICROALBUMIN / CREATININE URINE RATIO
Creatinine, Urine: 206 mg/dL (ref 20–370)
MICROALB UR: 1.5 mg/dL
MICROALB/CREAT RATIO: 7 ug/mg{creat} (ref <30)

## 2017-01-28 LAB — HEMOGLOBIN A1C
HEMOGLOBIN A1C: 5.7 % — AB (ref <5.7)
Mean Plasma Glucose: 117 mg/dL

## 2017-01-28 LAB — INSULIN, RANDOM: INSULIN: 5.3 u[IU]/mL (ref 2.0–19.6)

## 2017-01-28 LAB — PSA: PSA: 0.3 ng/mL (ref <=4.0)

## 2017-02-10 ENCOUNTER — Other Ambulatory Visit: Payer: Self-pay | Admitting: Internal Medicine

## 2017-02-11 ENCOUNTER — Other Ambulatory Visit: Payer: Self-pay

## 2017-02-11 DIAGNOSIS — Z1212 Encounter for screening for malignant neoplasm of rectum: Secondary | ICD-10-CM

## 2017-02-11 LAB — POC HEMOCCULT BLD/STL (HOME/3-CARD/SCREEN)
FECAL OCCULT BLD: NEGATIVE
FECAL OCCULT BLD: NEGATIVE
FECAL OCCULT BLD: NEGATIVE

## 2017-03-02 DIAGNOSIS — M1711 Unilateral primary osteoarthritis, right knee: Secondary | ICD-10-CM | POA: Diagnosis not present

## 2017-04-01 ENCOUNTER — Other Ambulatory Visit: Payer: Self-pay | Admitting: Internal Medicine

## 2017-05-13 DIAGNOSIS — M1711 Unilateral primary osteoarthritis, right knee: Secondary | ICD-10-CM | POA: Diagnosis not present

## 2017-05-18 ENCOUNTER — Other Ambulatory Visit: Payer: Self-pay | Admitting: Physician Assistant

## 2017-07-20 DIAGNOSIS — M1711 Unilateral primary osteoarthritis, right knee: Secondary | ICD-10-CM | POA: Diagnosis not present

## 2017-07-28 NOTE — Progress Notes (Signed)
MEDICARE ANNUAL WELLNESS VISIT AND FOLLOW UP UHC Assessment:    Essential hypertension - continue medications, DASH diet, exercise and monitor at home. Call if greater than 130/80.   Hyperlipidemia -continue medications, check lipids, decrease fatty foods, increase activity.    Vitamin D deficiency Continue supplement  Prediabetes Discussed general issues about diabetes pathophysiology and management., Educational material distributed., Suggested low cholesterol diet., Encouraged aerobic exercise., Discussed foot care., Reminded to get yearly retinal exam.   Encounter for long-term (current) use of medications - Magnesium   BPH/obstruction Continue medications  Primary osteoarthritis of right knee Monitor  Encounter for Medicare annual wellness exam 1 year  BMI 26.0-26.9,adult  Overweight  - long discussion about weight loss, diet, and exercise -recommended diet heavy in fruits and veggies and low in animal meats, cheeses, and dairy products  Actinic keratosis Schedule freezing head and arms, may need removal from left chest  Future Appointments  Date Time Provider [place]  02/11/2018  9:00 AM Unk Pinto, MD [facility] None     Plan:   During the course of the visit the patient was educated and counseled about appropriate screening and preventive services including:    Pneumococcal vaccine   Influenza vaccine  Td vaccine  Screening electrocardiogram  Colorectal cancer screening  Diabetes screening  Glaucoma screening  Nutrition counseling     Subjective:  Carlos Baldwin is a 82 y.o. male who presents for Medicare Annual Wellness Visit and 3 month follow up for HTN, hyperlipidemia, prediabetes, and vitamin D Def.   His blood pressure has been controlled at home, today their BP is BP: 122/78 He does workout, he rides bike at home, has retired from police work.   He denies chest pain, shortness of breath, dizziness.  Had left knee  replaced and wants right knee replaced soon.  He is not on cholesterol medication and denies myalgias. His cholesterol is at goal. The cholesterol last visit was:   Lab Results  Component Value Date   CHOL 152 01/27/2017   HDL 57 01/27/2017   LDLCALC 84 01/27/2017   TRIG 55 01/27/2017   CHOLHDL 2.7 01/27/2017   He has been working on diet and exercise for prediabetes, and denies paresthesia of the feet, polydipsia and polyuria. Last A1C in the office was:   Lab Results  Component Value Date   HGBA1C 5.7 (H) 01/27/2017   Patient is on Vitamin D supplement.   Lab Results  Component Value Date   VD25OH 32 01/27/2017   He is on proscar and flomax for BMP which helps.    Has history of asthma but has not had an issues in years, no albuterol inhaler.  BMI is Body mass index is 26.49 kg/m., he is working on diet and exercise. Wt Readings from Last 3 Encounters:  08/02/17 179 lb 6.4 oz (81.4 kg)  01/27/17 180 lb 6.4 oz (81.8 kg)  09/09/16 185 lb (83.9 kg)    Medication Review: Current Outpatient Medications on File Prior to Visit  Medication Sig Dispense Refill  . finasteride (PROSCAR) 5 MG tablet take 1 tablet by mouth once daily for prostate 90 tablet 1  . fluticasone (FLONASE) 50 MCG/ACT nasal spray Place 2 sprays into both nostrils daily. 1 g 0  . losartan (COZAAR) 100 MG tablet take 1 tablet by mouth once daily 90 tablet 1  . meloxicam (MOBIC) 15 MG tablet take 1 tablet by mouth once daily 90 tablet 1  . Polyethyl Glycol-Propyl Glycol (SYSTANE OP) Apply 1  drop to eye 2 (two) times daily as needed (Dry eyes).    . tamsulosin (FLOMAX) 0.4 MG CAPS capsule take 1 capsule by mouth once daily for prostate 90 capsule 1   No current facility-administered medications on file prior to visit.    Review of Systems  Constitutional: Negative for chills, diaphoresis, fever, malaise/fatigue and weight loss.  HENT: Negative for ear discharge, ear pain, hearing loss, nosebleeds and tinnitus.  Sore throat: got better.   Eyes: Negative.   Respiratory: Negative for cough, hemoptysis, sputum production, shortness of breath, wheezing and stridor.   Cardiovascular: Negative.   Gastrointestinal: Negative.   Genitourinary: Negative.   Musculoskeletal: Positive for joint pain (right knee, left knee better since replacement). Negative for back pain, falls, myalgias and neck pain.  Skin: Negative.   Neurological: Negative.  Negative for weakness and headaches.  Psychiatric/Behavioral: Negative.     Current Problems (verified) Patient Active Problem List   Diagnosis Date Noted  . Encounter for Medicare annual wellness exam 07/28/2016  . OA (osteoarthritis) of knee 11/04/2015  . Prediabetes 12/21/2013  . Medication management 12/21/2013  . Essential hypertension 08/02/2013  . Hyperlipidemia 08/02/2013  . Vitamin D deficiency 08/02/2013  . Bladder neck obstruction 08/02/2013    Screening Tests Immunization History  Administered Date(s) Administered  . Pneumococcal Conjugate-13 06/27/2014  . Pneumococcal Polysaccharide-23 09/07/2006  . Zoster 09/07/2008   Preventative care: Last colonoscopy: 2010 [place] AB 12/2011 CXR 2002  Prior vaccinations: TD or Tdap: 2010  Influenza: 2018 with the city Pneumococcal: 2008 Prevnar: 2015 Shingles/Zostavax: 2010  Names of Other Physician/Practitioners you currently use: 1. [facility] here for primary care Dr. Ellie Lunch- has been 1 year, has appointment coming up.  Patient Care Team: Unk Pinto, MD as PCP - General (Internal Medicine) Garald Balding, MD as Consulting Physician (Orthopedic Surgery) Ladene Artist, MD as Consulting Physician (Gastroenterology) Posey Pronto, DDS as Consulting Physician (Dentistry) Luberta Mutter, MD as Consulting Physician (Ophthalmology)   Allergies Allergies  Allergen Reactions  . Atenolol Other (See Comments)    "not sure"  . Pravastatin      Joint pain  . Simvastatin     Joint pain  . Sulfonamide Derivatives Swelling and Rash    SURGICAL HISTORY He  has a past surgical history that includes Knee surgery (Right, 1972); left knee arthroscopy (01-14-2012); and Total knee arthroplasty (Left, 11/04/2015). FAMILY HISTORY His family history is not on file. - mother/father passed in 90's no history, siblings healthy SOCIAL HISTORY He  reports that  has never smoked. he has never used smokeless tobacco. He reports that he does not drink alcohol or use drugs.  MEDICARE WELLNESS OBJECTIVES: Physical activity: Current Exercise Habits: Structured exercise class, Type of exercise: Other - see comments;strength training/weights(biking), Time (Minutes): 30, Frequency (Times/Week): 4, Weekly Exercise (Minutes/Week): 120, Intensity: Mild Cardiac risk factors: Cardiac Risk Factors include: advanced age (>27men, >74 women);dyslipidemia;hypertension;male gender Depression/mood screen:   Depression screen [facility] 2/9 08/02/2017  Decreased Interest 0  Down, Depressed, Hopeless 0  PHQ - 2 Score 0    ADLs:  In your present state of health, do you have any difficulty performing the following activities: 08/02/2017 01/27/2017  Hearing? N N  Vision? N N  Difficulty concentrating or making decisions? N N  Walking or climbing stairs? N N  Dressing or bathing? N -  Doing errands, shopping? N N  Some recent data might be hidden     Cognitive Testing  Alert? Yes  Normal Appearance?Yes  Oriented to person? Yes  Place? Yes   Time? Yes  Recall of three objects?  Yes  Can perform simple calculations? Yes  Displays appropriate judgment?Yes  Can read the correct time from a watch face?Yes  EOL planning: Does Patient Have a Medical Advance Directive?: Yes Type of Advance Directive: Healthcare Power of Attorney, Living will [place] in Chart?: No - copy requested    Objective:   Blood pressure 122/78, pulse 93, temperature  97.7 F (36.5 C), resp. rate 16, height 5\' 9"  (1.753 m), weight 179 lb 6.4 oz (81.4 kg), SpO2 96 %. Body mass index is 26.49 kg/m.  General appearance: alert, no distress, WD/WN, male HEENT: normocephalic, sclerae anicteric, TMs pearly, nares patent, no discharge or erythema, pharynx normal Oral cavity: MMM, no lesions Neck: supple, no lymphadenopathy, no thyromegaly, no masses Heart: RRR, normal S1, S2, no murmurs Lungs: CTA bilaterally, no wheezes, rhonchi, or rales Abdomen: +bs, soft, non tender, non distended, no masses, no hepatomegaly, no splenomegaly Musculoskeletal: nontender, no swelling, no obvious deformity Extremities: no edema, no cyanosis, no clubbing Pulses: 2+ symmetric, upper and lower extremities, normal cap refill Neurological: alert, oriented x 3, CN2-12 intact, strength normal upper extremities and lower extremities, sensation normal throughout, DTRs 2+ throughout, no cerebellar signs, gait antalgic due to knees Psychiatric: normal affect, behavior normal, pleasant   Medicare Attestation I have personally reviewed: The patient's medical and social history Their use of alcohol, tobacco or illicit drugs Their current medications and supplements The patient's functional ability including ADLs,fall risks, home safety risks, cognitive, and hearing and visual impairment Diet and physical activities Evidence for depression or mood disorders  The patient's weight, height, BMI, and visual acuity have been recorded in the chart.  I have made referrals, counseling, and provided education to the patient based on review of the above and I have provided the patient with a written personalized care plan for preventive services.     Vicie Mutters, PA-C   08/02/2017

## 2017-08-02 ENCOUNTER — Encounter: Payer: Self-pay | Admitting: Physician Assistant

## 2017-08-02 ENCOUNTER — Ambulatory Visit (INDEPENDENT_AMBULATORY_CARE_PROVIDER_SITE_OTHER): Payer: Medicare Other | Admitting: Physician Assistant

## 2017-08-02 VITALS — BP 122/78 | HR 93 | Temp 97.7°F | Resp 16 | Ht 69.0 in | Wt 179.4 lb

## 2017-08-02 DIAGNOSIS — Z6826 Body mass index (BMI) 26.0-26.9, adult: Secondary | ICD-10-CM

## 2017-08-02 DIAGNOSIS — R6889 Other general symptoms and signs: Secondary | ICD-10-CM

## 2017-08-02 DIAGNOSIS — N320 Bladder-neck obstruction: Secondary | ICD-10-CM

## 2017-08-02 DIAGNOSIS — R7303 Prediabetes: Secondary | ICD-10-CM

## 2017-08-02 DIAGNOSIS — E559 Vitamin D deficiency, unspecified: Secondary | ICD-10-CM

## 2017-08-02 DIAGNOSIS — E782 Mixed hyperlipidemia: Secondary | ICD-10-CM

## 2017-08-02 DIAGNOSIS — Z79899 Other long term (current) drug therapy: Secondary | ICD-10-CM | POA: Diagnosis not present

## 2017-08-02 DIAGNOSIS — Z0001 Encounter for general adult medical examination with abnormal findings: Secondary | ICD-10-CM

## 2017-08-02 DIAGNOSIS — I10 Essential (primary) hypertension: Secondary | ICD-10-CM

## 2017-08-02 DIAGNOSIS — L570 Actinic keratosis: Secondary | ICD-10-CM

## 2017-08-02 DIAGNOSIS — M1712 Unilateral primary osteoarthritis, left knee: Secondary | ICD-10-CM

## 2017-08-02 DIAGNOSIS — Z0000 Encounter for general adult medical examination without abnormal findings: Secondary | ICD-10-CM

## 2017-08-02 NOTE — Patient Instructions (Addendum)
Do carpal tunnel brace for 4-6 weeks at night if your hand numbness is not better we will send to ortho for evaluation  Will come back for freezing for actinic keratosis and removal of area on right chest  Do those stretches for your hip we discussed  You are dehydrated, please increase your fluids, this is causing your kidney function to be decreased. Being dehydrated can also cause fatigue, headaches, muscle aches, joint pain, and dry skin/nails so please increase your fluids.     Carpal Tunnel Syndrome Carpal tunnel syndrome is a condition that causes pain in your hand and arm. The carpal tunnel is a narrow area that is on the palm side of your wrist. Repeated wrist motion or certain diseases may cause swelling in the tunnel. This swelling can pinch the main nerve in the wrist (median nerve). Follow these instructions at home: If you have a splint:  Wear it as told by your doctor. Remove it only as told by your doctor.  Loosen the splint if your fingers: ? Become numb and tingle. ? Turn blue and cold.  Keep the splint clean and dry. General instructions  Take over-the-counter and prescription medicines only as told by your doctor.  Rest your wrist from any activity that may be causing your pain. If needed, talk to your employer about changes that can be made in your work, such as getting a wrist pad to use while typing.  If directed, apply ice to the painful area: ? Put ice in a plastic bag. ? Place a towel between your skin and the bag. ? Leave the ice on for 20 minutes, 2-3 times per day.  Keep all follow-up visits as told by your doctor. This is important.  Do any exercises as told by your doctor, physical therapist, or occupational therapist. Contact a doctor if:  You have new symptoms.  Medicine does not help your pain.  Your symptoms get worse. This information is not intended to replace advice given to you by your health care provider. Make sure you discuss any  questions you have with your health care provider. Document Released: 08/13/2011 Document Revised: 01/30/2016 Document Reviewed: 01/09/2015 Elsevier Interactive Patient Education  [employer].

## 2017-08-03 ENCOUNTER — Other Ambulatory Visit: Payer: Medicare Other

## 2017-08-03 DIAGNOSIS — I10 Essential (primary) hypertension: Secondary | ICD-10-CM | POA: Diagnosis not present

## 2017-08-03 DIAGNOSIS — E782 Mixed hyperlipidemia: Secondary | ICD-10-CM | POA: Diagnosis not present

## 2017-08-03 DIAGNOSIS — R7303 Prediabetes: Secondary | ICD-10-CM | POA: Diagnosis not present

## 2017-08-04 LAB — HEPATIC FUNCTION PANEL
AG RATIO: 2 (calc) (ref 1.0–2.5)
ALBUMIN MSPROF: 4 g/dL (ref 3.6–5.1)
ALKALINE PHOSPHATASE (APISO): 67 U/L (ref 40–115)
ALT: 14 U/L (ref 9–46)
AST: 17 U/L (ref 10–35)
BILIRUBIN INDIRECT: 0.6 mg/dL (ref 0.2–1.2)
Bilirubin, Direct: 0.1 mg/dL (ref 0.0–0.2)
GLOBULIN: 2 g/dL (ref 1.9–3.7)
TOTAL PROTEIN: 6 g/dL — AB (ref 6.1–8.1)
Total Bilirubin: 0.7 mg/dL (ref 0.2–1.2)

## 2017-08-04 LAB — BASIC METABOLIC PANEL WITH GFR
BUN: 22 mg/dL (ref 7–25)
CALCIUM: 8.8 mg/dL (ref 8.6–10.3)
CHLORIDE: 107 mmol/L (ref 98–110)
CO2: 27 mmol/L (ref 20–32)
CREATININE: 0.86 mg/dL (ref 0.70–1.11)
GFR, Est African American: 95 mL/min/{1.73_m2} (ref >=60)
GFR, Est Non African American: 82 mL/min/{1.73_m2} (ref >=60)
GLUCOSE: 95 mg/dL (ref 65–99)
Potassium: 4.2 mmol/L (ref 3.5–5.3)
Sodium: 141 mmol/L (ref 135–146)

## 2017-08-04 LAB — TSH: TSH: 1.74 m[IU]/L (ref 0.40–4.50)

## 2017-08-04 LAB — LIPID PANEL
CHOLESTEROL: 156 mg/dL (ref <200)
HDL: 54 mg/dL (ref >40)
LDL Cholesterol (Calc): 84 mg/dL (calc)
Non-HDL Cholesterol (Calc): 102 mg/dL (calc) (ref <130)
TRIGLYCERIDES: 89 mg/dL (ref <150)
Total CHOL/HDL Ratio: 2.9 (calc) (ref <5.0)

## 2017-08-04 LAB — CBC WITH DIFFERENTIAL/PLATELET
BASOS PCT: 1.2 %
Basophils Absolute: 77 cells/uL (ref 0–200)
EOS ABS: 160 {cells}/uL (ref 15–500)
Eosinophils Relative: 2.5 %
HCT: 40 % (ref 38.5–50.0)
HEMOGLOBIN: 14.1 g/dL (ref 13.2–17.1)
Lymphs Abs: 1248 cells/uL (ref 850–3900)
MCH: 30.9 pg (ref 27.0–33.0)
MCHC: 35.3 g/dL (ref 32.0–36.0)
MCV: 87.7 fL (ref 80.0–100.0)
MONOS PCT: 11.4 %
MPV: 9 fL (ref 7.5–12.5)
NEUTROS ABS: 4186 {cells}/uL (ref 1500–7800)
Neutrophils Relative %: 65.4 %
PLATELETS: 221 10*3/uL (ref 140–400)
RBC: 4.56 10*6/uL (ref 4.20–5.80)
RDW: 12.5 % (ref 11.0–15.0)
TOTAL LYMPHOCYTE: 19.5 %
WBC: 6.4 10*3/uL (ref 3.8–10.8)
WBCMIX: 730 {cells}/uL (ref 200–950)

## 2017-08-04 LAB — HEMOGLOBIN A1C
EAG (MMOL/L): 6.6 (calc)
HEMOGLOBIN A1C: 5.8 %{Hb} — AB (ref <5.7)
MEAN PLASMA GLUCOSE: 120 (calc)

## 2017-08-06 DIAGNOSIS — M1711 Unilateral primary osteoarthritis, right knee: Secondary | ICD-10-CM | POA: Diagnosis not present

## 2017-08-08 ENCOUNTER — Other Ambulatory Visit: Payer: Self-pay | Admitting: Internal Medicine

## 2017-08-17 ENCOUNTER — Ambulatory Visit: Payer: Self-pay | Admitting: Orthopedic Surgery

## 2017-09-10 ENCOUNTER — Encounter: Payer: Self-pay | Admitting: Physician Assistant

## 2017-09-10 ENCOUNTER — Ambulatory Visit (INDEPENDENT_AMBULATORY_CARE_PROVIDER_SITE_OTHER): Payer: Medicare Other | Admitting: Physician Assistant

## 2017-09-10 VITALS — BP 126/80 | HR 75 | Temp 97.5°F | Ht 69.0 in | Wt 178.2 lb

## 2017-09-10 DIAGNOSIS — L570 Actinic keratosis: Secondary | ICD-10-CM

## 2017-09-10 NOTE — Patient Instructions (Signed)
Actinic Keratosis An actinic keratosis is a precancerous growth on the skin. This means that it could develop into skin cancer if it is not treated. They can turn into skin cancer within one year if they are not treated. It is important to have all of these growths evaluated to determine the best treatment approach. What are the causes? This condition is caused by getting too much ultraviolet (UV) radiation from the sun or other UV light sources. What increases the risk? The following factors may make you more likely to develop this condition:  Having light-colored skin and blue eyes.  Having blonde or red hair.  Spending a lot of time in the sun.  Inadequate skin protection when outdoors. This may include: ? Not using sunscreen properly. ? Not covering up skin that is exposed to sunlight.  Aging. The risk of developing an actinic keratosis increases with age.  What are the signs or symptoms? Actinic keratoses look like scaly, rough spots of skin.They can be as small as a pinhead or as big as a quarter. They may itch, hurt, or feel sensitive. In most cases, the growths become red. In some cases, they may be skin-colored, light tan, dark tan, pink, or a combination of any of these colors. There may be a small piece of pink or gray skin (skin tag) growing from the actinic keratosis. In some cases, it may be easier to notice actinic keratoses by feeling them, rather than seeing them. Actinic keratoses appear most often on areas of skin that get a lot of sun exposure, including the scalp, face, ears, lips, upper back, forearms, and the backs of the hands. Sometimes, actinic keratoses disappear, but many reappear a few days to a few weeks later. How is this diagnosed? This condition is usually diagnosed with a physical exam. A tissue sample may be removed from the actinic keratosis and examined under a microscope (biopsy). How is this treated?  Treatment for this condition may  include:  Scraping off the actinic keratosis (curettage).  Freezing the actinic keratosis with liquid nitrogen (cryosurgery). This causes the growth to eventually fall off the skin.  Applying medicated creams or gels to destroy the cells in the growth.  Applying chemicals to the actinic keratosis to make the outer layers of skin peel off (chemical peel).  Photodynamic therapy. In this procedure, medicated cream is applied to the actinic keratosis. This cream increases your skin's sensitivity to light. Then, a strong light is aimed at the actinic keratosis to destroy cells in the growth.  Follow these instructions at home: Skin care  Apply cool, wet cloths (cool compresses) to the affected areas.  Do not scratch your skin.  Check your skin regularly for any growths, especially growths that: ? Start to itch or bleed. ? Change in size, shape, or color. Caring for the treated area  Keep the treated area clean and dry as told by your health care provider.  Do not apply any medicine, cream, or lotion to the treated area unless your health care provider tells you to do that.  Do not pick at blisters or try to break them open. This can cause infection and scarring.  If you have red or irritated skin after treatment, follow instructions from your health care provider about how to take care of the treated area. Make sure you: ? Wash your hands with soap and water before you change your bandage (dressing). If soap and water are not available, use hand sanitizer. ? Change your  dressing as told by your health care provider.  If you have red or irritated skin after treatment, check your treated area every day for signs of infection. Check for: ? Swelling, pain, or more redness. ? Fluid or blood. ? Warmth. ? Pus or a bad smell. General instructions  Take over-the-counter and prescription medicines only as told by your health care provider.  Return to your normal activities as told by your  health care provider. Ask your health care provider what activities are safe for you.  Do not use any tobacco products, such as cigarettes, chewing tobacco, and e-cigarettes. If you need help quitting, ask your health care provider.  Have a skin exam done every year by a health care provider who is a skin conditions specialist (dermatologist).  Keep all follow-up visits as told by your health care provider. This is important. How is this prevented?  Do not get sunburns.  Try to avoid the sun between 10:00 a.m. and 4:00 p.m. This is when the UV light is the strongest.  Use a sunscreen or sunblock with SPF 30 (sun protection factor 30) or greater.  Apply sunscreen before you are exposed to sunlight, and reapply periodically as often as directed by the instructions on the sunscreen container.  Always wear sunglasses that have UV protection, and always wear hats and clothing to protect your skin from sunlight.  When possible, avoid medicines that increase your sensitivity to sunlight. These include: ? Certain antibiotic medicines. ? Certain water pills (diuretics). ? Certain prescription medicines that are used to treat acne (retinoids).  Do not use tanning beds or other indoor tanning devices. Contact a health care provider if:  You notice any changes or new growths on your skin.  You have swelling, pain, or more redness around your treated area.  You have fluid or blood coming from your treated area.  Your treated area feels warm to the touch.  You have pus or a bad smell coming from your treated area.  You have a fever.  You have a blister that becomes large and painful. This information is not intended to replace advice given to you by your health care provider. Make sure you discuss any questions you have with your health care provider. Document Released: 11/20/2008 Document Revised: 04/24/2016 Document Reviewed: 05/04/2015 Elsevier Interactive Patient Education  [employer].

## 2017-09-10 NOTE — Progress Notes (Signed)
Chief Complaint: Patient presents for evaluation of skin lesions. Patient has erythematous, scaly lesions on head  and upper extremity, changing in shape.   Exam: actinic keratoses on bilateral arms and forehead  Procedure Details   The risks, benefits, indications, potential complications, and alternatives were explained to the patient and informed consent obtained.  Liquid nitrogen was use in a 3 freeze and thaw technique. The patient tolerated the procedure well.   Condition: Stable  Complications:  None  Diagnosis: Actinic Keratosis  Procedure code:  [id number]  Plan: 1. Patient educated that the area will begin to heal in approximately a week.  2. Warning signs of infection were reviewed.    3. Recommended that the patient use OTC acetaminophen as needed for pain.

## 2017-09-14 ENCOUNTER — Encounter (HOSPITAL_COMMUNITY): Payer: Self-pay | Admitting: *Deleted

## 2017-09-21 DIAGNOSIS — H11003 Unspecified pterygium of eye, bilateral: Secondary | ICD-10-CM | POA: Diagnosis not present

## 2017-09-21 DIAGNOSIS — H5203 Hypermetropia, bilateral: Secondary | ICD-10-CM | POA: Diagnosis not present

## 2017-09-21 DIAGNOSIS — H35371 Puckering of macula, right eye: Secondary | ICD-10-CM | POA: Diagnosis not present

## 2017-09-21 DIAGNOSIS — H2513 Age-related nuclear cataract, bilateral: Secondary | ICD-10-CM | POA: Diagnosis not present

## 2017-09-24 ENCOUNTER — Other Ambulatory Visit: Payer: Self-pay | Admitting: Internal Medicine

## 2017-10-03 ENCOUNTER — Ambulatory Visit: Payer: Self-pay | Admitting: Orthopedic Surgery

## 2017-10-03 NOTE — H&P (Signed)
Name Carlos Baldwin, Carlos Baldwin (69CV, M) DOB 21-Sep-1934    Chief Complaint - right knee pain   Vitals Ht: 5 ft 9 in 09/28/2017 03:17 pm Wt: 178 lbs 09/28/2017 03:17 pm BMI: 26.3 09/28/2017 03:17 pm BP: 134/68 09/28/2017 03:19 pm Pulse: 104 bpm 09/28/2017 03:19 pm   Allergies Reviewed Allergies SULFA (SULFONAMIDE ANTIBIOTICS): - Lip Swelling and Rash    Medications Reviewed Medications finasteride 5 mg tablet 09/24/17   filled [employer]UTIONS losartan 100 mg tablet 09/19/17   filled [employer] meloxicam 15 mg tablet 05/08/17   filled [employer] tamsulosin 0.4 mg capsule 08/20/17   filled [employer] Vitamin C 09/28/17   entered ALEXZANDREW PERKINS, PA-C Vitamin D 09/28/17   entered ALEXZANDREW PERKINS, PA-C vitamin E 09/28/17   entered ALEXZANDREW PERKINS, PA-C    Family History Reviewed Family History Father - Father deceased Mother - Mother deceased   Social History Reviewed Social History Smoking Status: Never smoker Alcohol intake: None Hand Dominance: Right Work related injury?: N Advance directive: (Notes: Living Will) Medical Power of Attorney: Y Live alone or with others?: with others (Notes: Lives with spouse) Marital status: Married   Surgical History Reviewed Surgical History Knee Surgery - Right Knee - 1972 Knee Joint Replacement - 11/04/2015 Left Knee   Past Medical History Reviewed Past Medical History Asthma: Y - Childhood Hypertension: Y Notes: History of Measels and Mumps    PI Patient is a 83 year old male scheduled for a right total knee arthroplasty on 10/11/2017 by Dr. Wynelle Link at [facility]. 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 He had an arthrotomy with meniscectomy on his right knee in the 1970s and had an arthroscopy with medial meniscectomy by Dr. Durward Fortes approximately five years ago. 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 It does catch, lock or give way. He went to [facility] and had viscosupplementation which he states did not help at all. The left knee was problematic so he has underwent a total knee replacement on that side. The left knee is doing better. The knees were essentially taking over his life and preventing him from doing what he desires. He has had injections in the past without much benefit. He is doing better since the left knee surgery. Now he is ready to proceed with the other side, the right knee at this time. They have been treated conservatively in the past for the above stated problem and despite conservative measures, they continue to have progressive pain and severe functional limitations and dysfunction. They have failed non-operative management including home exercise, medications, and injections. It is felt that they would benefit from undergoing total joint replacement. Risks and benefits of the procedure have been discussed with the patient and they elect to proceed with surgery. There are no active contraindications to surgery such as ongoing infection or rapidly progressive neurological disease   ROS Constitutional: Constitutional: no fever, chills, night sweats, or significant weight loss.  Cardiovascular: Cardiovascular: no palpitations or chest pain.  Respiratory: Respiratory: no cough or shortness of breath and No COPD.  Gastrointestinal: Gastrointestinal: no vomiting or nausea.  Musculoskeletal: Musculoskeletal: Joint Pain and swelling in Joints.  Neurologic: Neurologic: no numbness, tingling, or difficulty  with balance.   Physical Exam Patient is an 83 year old male.  General Mental Status - Alert, cooperative and good historian. General  Appearance - pleasant, Not in acute distress. Orientation - Oriented X3. Build & Nutrition - Well nourished and Well developed.  Head and Neck Head - normocephalic, atraumatic . Neck Global Assessment - supple, no bruit auscultated on the right, no bruit auscultated on the left.  Eye Pupil - Bilateral - PERR Motion - Bilateral - EOMI.  Chest and Lung Exam Auscultation Breath sounds - clear at anterior chest wall and clear at posterior chest wall. Adventitious sounds - No Adventitious sounds.  Cardiovascular Auscultation Rhythm - Regular rate and rhythm. Heart Sounds - S1 WNL and S2 WNL. Murmurs & Other Heart Sounds - Auscultation of the heart reveals - No Murmurs.  Abdomen Palpation/Percussion Tenderness - Abdomen is non-tender to palpation. Abdomen is soft. Auscultation Auscultation of the abdomen reveals - Bowel sounds normal.  Male Genitourinary Note: Not done, not pertinent to present illness  Musculoskeletal He does lack about 5 degrees of extension, flexion back to 125 degrees on the rigth knee. Large effusion about the knee. Moderately sized Baker cyst noted in the popliteal space. He is tender along the medial and lateral joint lines. Moderate patellofemoral crepitus. Distal pulses 2+. Slight varus deformity. Procedure Documentation None recorded.   Assessment / Plan 1. Osteoarthritis of right knee joint M17.11: Unilateral primary osteoarthritis, right knee  Patient Instructions Surgical Plans: Right Total Knee Replacement   Disposition: Home with wife  PCP: Dr., Janeece Agee  IV TXA  Anesthesia Issues: None  Patient was instructed on what medications to stop prior to surgery.  Return to Office Gaynelle Arabian, MD for Post-Op at [facility] on 10/26/2017 at 03:15 PM  Encounter signed-off by Mickel Crow, PA-C

## 2017-10-03 NOTE — H&P (View-Only) (Signed)
Name Carlos Baldwin, Carlos Baldwin (95KD, M) DOB [birth year]    Chief Complaint - right knee pain   Vitals Ht: 5 ft 9 in 09/28/2017 03:17 pm Wt: 178 lbs 09/28/2017 03:17 pm BMI: 26.3 09/28/2017 03:17 pm BP: 134/68 09/28/2017 03:19 pm Pulse: 104 bpm 09/28/2017 03:19 pm   Allergies Reviewed Allergies SULFA (SULFONAMIDE ANTIBIOTICS): - Lip Swelling and Rash    Medications Reviewed Medications finasteride 5 mg tablet 09/24/17   filled PRESCRIPTION SOLUTIONS losartan 100 mg tablet 09/19/17   filled PRESCRIPTION SOLUTIONS meloxicam 15 mg tablet 05/08/17   filled PRESCRIPTION SOLUTIONS tamsulosin 0.4 mg capsule 08/20/17   filled PRESCRIPTION SOLUTIONS Vitamin C 09/28/17   entered Malayasia Mirkin, PA-C Vitamin D 09/28/17   entered Annleigh Knueppel, PA-C vitamin E 09/28/17   entered Michaelpaul Apo, PA-C    Family History Reviewed Family History Father - Father deceased Mother - Mother deceased   Social History Reviewed Social History Smoking Status: Never smoker Alcohol intake: None Hand Dominance: Right Work related injury?: N Advance directive: (Notes: Living Will) Medical Power of Attorney: Y Live alone or with others?: with others (Notes: Lives with spouse) Marital status: Married   Surgical History Reviewed Surgical History Knee Surgery - Right Knee - 1972 Knee Joint Replacement - 11/04/2015 Left Knee   Past Medical History Reviewed Past Medical History Asthma: Y - Childhood Hypertension: Y Notes: History of Measels and Mumps    PI Patient is a 83 year old male scheduled for a right total knee arthroplasty on 10/11/2017 by Dr. Wynelle Link at [facility]. 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 He had an arthrotomy with meniscectomy on his right knee in the 1970s and had an arthroscopy with medial meniscectomy by Dr. Durward Fortes approximately five years ago. 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 It does catch, lock or give way. He went to [facility] and had viscosupplementation which he states did not help at all. The left knee was problematic so he has underwent a total knee replacement on that side. The left knee is doing better. The knees were essentially taking over his life and preventing him from doing what he desires. He has had injections in the past without much benefit. He is doing better since the left knee surgery. Now he is ready to proceed with the other side, the right knee at this time. They have been treated conservatively in the past for the above stated problem and despite conservative measures, they continue to have progressive pain and severe functional limitations and dysfunction. They have failed non-operative management including home exercise, medications, and injections. It is felt that they would benefit from undergoing total joint replacement. Risks and benefits of the procedure have been discussed with the patient and they elect to proceed with surgery. There are no active contraindications to surgery such as ongoing infection or rapidly progressive neurological disease   ROS Constitutional: Constitutional: no fever, chills, night sweats, or significant weight loss.  Cardiovascular: Cardiovascular: no palpitations or chest pain.  Respiratory: Respiratory: no cough or shortness of breath and No COPD.  Gastrointestinal: Gastrointestinal: no vomiting or nausea.  Musculoskeletal: Musculoskeletal: Joint Pain and swelling in Joints.  Neurologic: Neurologic: no numbness, tingling, or difficulty  with balance.   Physical Exam Patient is an 83 year old male.  General Mental Status - Alert, cooperative and good historian. General  Appearance - pleasant, Not in acute distress. Orientation - Oriented X3. Build & Nutrition - Well nourished and Well developed.  Head and Neck Head - normocephalic, atraumatic . Neck Global Assessment - supple, no bruit auscultated on the right, no bruit auscultated on the left.  Eye Pupil - Bilateral - PERR Motion - Bilateral - EOMI.  Chest and Lung Exam Auscultation Breath sounds - clear at anterior chest wall and clear at posterior chest wall. Adventitious sounds - No Adventitious sounds.  Cardiovascular Auscultation Rhythm - Regular rate and rhythm. Heart Sounds - S1 WNL and S2 WNL. Murmurs & Other Heart Sounds - Auscultation of the heart reveals - No Murmurs.  Abdomen Palpation/Percussion Tenderness - Abdomen is non-tender to palpation. Abdomen is soft. Auscultation Auscultation of the abdomen reveals - Bowel sounds normal.  Male Genitourinary Note: Not done, not pertinent to present illness  Musculoskeletal He does lack about 5 degrees of extension, flexion back to 125 degrees on the rigth knee. Large effusion about the knee. Moderately sized Baker cyst noted in the popliteal space. He is tender along the medial and lateral joint lines. Moderate patellofemoral crepitus. Distal pulses 2+. Slight varus deformity. Procedure Documentation None recorded.   Assessment / Plan 1. Osteoarthritis of right knee joint M17.11: Unilateral primary osteoarthritis, right knee  Patient Instructions Surgical Plans: Right Total Knee Replacement   Disposition: Home with wife  PCP: Dr., Janeece Agee  IV TXA  Anesthesia Issues: None  Patient was instructed on what medications to stop prior to surgery.  Return to Office Gaynelle Arabian, MD for Post-Op at [facility] on 10/26/2017 at 03:15 PM  Encounter signed-off by Mickel Crow, PA-C

## 2017-10-06 NOTE — Patient Instructions (Addendum)
[facility]  10/06/2017   Your procedure is scheduled on: 10-11-17   Report to [facility] Main  Entrance              Report to admitting at        1120 AM   Call this number if you have problems the morning of surgery [phone]    Remember: Do not eat food:After Midnight. YOU  MAY HAVE CLEAR LIQUIDS UNTIL 0850 AM THEN NOTHING BY MOUTH     Take these medicines the morning of surgery with A SIP OF WATER:none                                You may not have any metal on your body including hair pins and              piercings  Do not wear jewelry,  lotions, powders or perfumes, deodorant                       Men may shave face and neck.   Do not bring valuables to the hospital. [place].  Contacts, dentures or bridgework may not be worn into surgery.  Leave suitcase in the car. After surgery it may be brought to your room.                 Please read over the following fact sheets you were given: _____________________________________________________________________          [facility] - Preparing for Surgery Before surgery, you can play an important role.  Because skin is not sterile, your skin needs to be as free of germs as possible.  You can reduce the number of germs on your skin by washing with CHG (chlorahexidine gluconate) soap before surgery.  CHG is an antiseptic cleaner which kills germs and bonds with the skin to continue killing germs even after washing. Please DO NOT use if you have an allergy to CHG or antibacterial soaps.  If your skin becomes reddened/irritated stop using the CHG and inform your nurse when you arrive at Short Stay. Do not shave (including legs and underarms) for at least 48 hours prior to the first CHG shower.  You may shave your face/neck. Please follow these instructions carefully:  1.  Shower with CHG Soap the night before surgery and the  morning of  Surgery.  2.  If you choose to wash your hair, wash your hair first as usual with your  normal  shampoo.  3.  After you shampoo, rinse your hair and body thoroughly to remove the  shampoo.                           4.  Use CHG as you would any other liquid soap.  You can apply chg directly  to the skin and wash                       Gently with a scrungie or clean washcloth.  5.  Apply the CHG Soap to your body ONLY FROM THE NECK DOWN.   Do not use on face/ open  Wound or open sores. Avoid contact with eyes, ears mouth and genitals (private parts).                       Wash face,  Genitals (private parts) with your normal soap.             6.  Wash thoroughly, paying special attention to the area where your surgery  will be performed.  7.  Thoroughly rinse your body with warm water from the neck down.  8.  DO NOT shower/wash with your normal soap after using and rinsing off  the CHG Soap.                9.  Pat yourself dry with a clean towel.            10.  Wear clean pajamas.            11.  Place clean sheets on your bed the night of your first shower and do not  sleep with pets. Day of Surgery : Do not apply any lotions/deodorants the morning of surgery.  Please wear clean clothes to the hospital/surgery center.  FAILURE TO FOLLOW THESE INSTRUCTIONS MAY RESULT IN THE CANCELLATION OF YOUR SURGERY PATIENT SIGNATURE_________________________________  NURSE SIGNATURE__________________________________  ________________________________________________________________________    CLEAR LIQUID DIET  UNTIL 0850 AM THEN NOTHING BY MOUTH   Foods Allowed                                                                     Foods Excluded  Coffee and tea, regular and decaf                             liquids that you cannot  Plain Jell-O in any flavor                                             see through such as: Fruit ices (not with fruit pulp)                                      milk, soups, orange juice  Iced Popsicles                                    All solid food Carbonated beverages, regular and diet                                    Cranberry, grape and apple juices Sports drinks like Gatorade Lightly seasoned clear broth or consume(fat free) Sugar, honey syrup  Sample Menu Breakfast                                Lunch  Supper Cranberry juice                    Beef broth                            Chicken broth Jell-O                                     Grape juice                           Apple juice Coffee or tea                        Jell-O                                      Popsicle                                                Coffee or tea                        Coffee or tea  _____________________________________________________________________   WHAT IS A BLOOD TRANSFUSION? Blood Transfusion Information  A transfusion is the replacement of blood or some of its parts. Blood is made up of multiple cells which provide different functions.  Red blood cells carry oxygen and are used for blood loss replacement.  White blood cells fight against infection.  Platelets control bleeding.  Plasma helps clot blood.  Other blood products are available for specialized needs, such as hemophilia or other clotting disorders. BEFORE THE TRANSFUSION  Who gives blood for transfusions?   Healthy volunteers who are fully evaluated to make sure their blood is safe. This is blood bank blood. Transfusion therapy is the safest it has ever been in the practice of medicine. Before blood is taken from a donor, a complete history is taken to make sure that person has no history of diseases nor engages in risky social behavior (examples are intravenous drug use or sexual activity with multiple partners). The donor's travel history is screened to minimize risk of transmitting infections, such as malaria. The donated  blood is tested for signs of infectious diseases, such as HIV and hepatitis. The blood is then tested to be sure it is compatible with you in order to minimize the chance of a transfusion reaction. If you or a relative donates blood, this is often done in anticipation of surgery and is not appropriate for emergency situations. It takes many days to process the donated blood. RISKS AND COMPLICATIONS Although transfusion therapy is very safe and saves many lives, the main dangers of transfusion include:   Getting an infectious disease.  Developing a transfusion reaction. This is an allergic reaction to something in the blood you were given. Every precaution is taken to prevent this. The decision to have a blood transfusion has been considered carefully by your caregiver before blood is given. Blood is not given unless the benefits outweigh the risks. AFTER THE TRANSFUSION  Right after receiving a blood transfusion, you will usually  feel much better and more energetic. This is especially true if your red blood cells have gotten low (anemic). The transfusion raises the level of the red blood cells which carry oxygen, and this usually causes an energy increase.  The nurse administering the transfusion will monitor you carefully for complications. HOME CARE INSTRUCTIONS  No special instructions are needed after a transfusion. You may find your energy is better. Speak with your caregiver about any limitations on activity for underlying diseases you may have. SEEK MEDICAL CARE IF:   Your condition is not improving after your transfusion.  You develop redness or irritation at the intravenous (IV) site. SEEK IMMEDIATE MEDICAL CARE IF:  Any of the following symptoms occur over the next 12 hours:  Shaking chills.  You have a temperature by mouth above 102 F (38.9 C), not controlled by medicine.  Chest, back, or muscle pain.  People around you feel you are not acting correctly or are  confused.  Shortness of breath or difficulty breathing.  Dizziness and fainting.  You get a rash or develop hives.  You have a decrease in urine output.  Your urine turns a dark color or changes to pink, red, or brown. Any of the following symptoms occur over the next 10 days:  You have a temperature by mouth above 102 F (38.9 C), not controlled by medicine.  Shortness of breath.  Weakness after normal activity.  The white part of the eye turns yellow (jaundice).  You have a decrease in the amount of urine or are urinating less often.  Your urine turns a dark color or changes to pink, red, or brown. Document Released: 08/21/2000 Document Revised: 11/16/2011 Document Reviewed: 04/09/2008 ExitCare Patient Information 2014 [place].  _______________________________________________________________________  Incentive Spirometer  An incentive spirometer is a tool that can help keep your lungs clear and active. This tool measures how well you are filling your lungs with each breath. Taking long deep breaths may help reverse or decrease the chance of developing breathing (pulmonary) problems (especially infection) following:  A long period of time when you are unable to move or be active. BEFORE THE PROCEDURE   If the spirometer includes an indicator to show your best effort, your nurse or respiratory therapist will set it to a desired goal.  If possible, sit up straight or lean slightly forward. Try not to slouch.  Hold the incentive spirometer in an upright position. INSTRUCTIONS FOR USE  1. Sit on the edge of your bed if possible, or sit up as far as you can in bed or on a chair. 2. Hold the incentive spirometer in an upright position. 3. Breathe out normally. 4. Place the mouthpiece in your mouth and seal your lips tightly around it. 5. Breathe in slowly and as deeply as possible, raising the piston or the ball toward the top of the column. 6. Hold your breath for  3-5 seconds or for as long as possible. Allow the piston or ball to fall to the bottom of the column. 7. Remove the mouthpiece from your mouth and breathe out normally. 8. Rest for a few seconds and repeat Steps 1 through 7 at least 10 times every 1-2 hours when you are awake. Take your time and take a few normal breaths between deep breaths. 9. The spirometer may include an indicator to show your best effort. Use the indicator as a goal to work toward during each repetition. 10. After each set of 10 deep breaths, practice coughing to  be sure your lungs are clear. If you have an incision (the cut made at the time of surgery), support your incision when coughing by placing a pillow or rolled up towels firmly against it. Once you are able to get out of bed, walk around indoors and cough well. You may stop using the incentive spirometer when instructed by your caregiver.  RISKS AND COMPLICATIONS  Take your time so you do not get dizzy or light-headed.  If you are in pain, you may need to take or ask for pain medication before doing incentive spirometry. It is harder to take a deep breath if you are having pain. AFTER USE  Rest and breathe slowly and easily.  It can be helpful to keep track of a log of your progress. Your caregiver can provide you with a simple table to help with this. If you are using the spirometer at home, follow these instructions: Paint IF:   You are having difficultly using the spirometer.  You have trouble using the spirometer as often as instructed.  Your pain medication is not giving enough relief while using the spirometer.  You develop fever of 100.5 F (38.1 C) or higher. SEEK IMMEDIATE MEDICAL CARE IF:   You cough up bloody sputum that had not been present before.  You develop fever of 102 F (38.9 C) or greater.  You develop worsening pain at or near the incision site. MAKE SURE YOU:   Understand these instructions.  Will watch your  condition.  Will get help right away if you are not doing well or get worse. Document Released: 01/04/2007 Document Revised: 11/16/2011 Document Reviewed: 03/07/2007 [facility] Patient Information 2014 [place], [place].   ________________________________________________________________________

## 2017-10-07 ENCOUNTER — Encounter (HOSPITAL_COMMUNITY): Payer: Self-pay

## 2017-10-07 ENCOUNTER — Other Ambulatory Visit: Payer: Self-pay

## 2017-10-07 ENCOUNTER — Encounter (HOSPITAL_COMMUNITY)
Admission: RE | Admit: 2017-10-07 | Discharge: 2017-10-07 | Disposition: A | Discharge status: Home / Self Care | Payer: Medicare Other | Source: Ambulatory Visit | Attending: Orthopedic Surgery | Admitting: Orthopedic Surgery

## 2017-10-07 DIAGNOSIS — M1711 Unilateral primary osteoarthritis, right knee: Secondary | ICD-10-CM | POA: Diagnosis not present

## 2017-10-07 DIAGNOSIS — Z01812 Encounter for preprocedural laboratory examination: Secondary | ICD-10-CM | POA: Diagnosis not present

## 2017-10-07 DIAGNOSIS — Z01818 Encounter for other preprocedural examination: Secondary | ICD-10-CM | POA: Diagnosis not present

## 2017-10-07 HISTORY — DX: Prediabetes: R73.03

## 2017-10-07 LAB — CBC
HCT: 43.7 % (ref 39.0–52.0)
HEMOGLOBIN: 14.4 g/dL (ref 13.0–17.0)
MCH: 30.2 pg (ref 26.0–34.0)
MCHC: 33 g/dL (ref 30.0–36.0)
MCV: 91.6 fL (ref 78.0–100.0)
PLATELETS: 239 10*3/uL (ref 150–400)
RBC: 4.77 MIL/uL (ref 4.22–5.81)
RDW: 13.6 % (ref 11.5–15.5)
WBC: 7.4 10*3/uL (ref 4.0–10.5)

## 2017-10-07 LAB — COMPREHENSIVE METABOLIC PANEL
ALBUMIN: 4.1 g/dL (ref 3.5–5.0)
ALT: 20 U/L (ref 17–63)
ANION GAP: 7 (ref 5–15)
AST: 21 U/L (ref 15–41)
Alkaline Phosphatase: 69 U/L (ref 38–126)
BUN: 24 mg/dL — ABNORMAL HIGH (ref 6–20)
CHLORIDE: 106 mmol/L (ref 101–111)
CO2: 26 mmol/L (ref 22–32)
Calcium: 9.4 mg/dL (ref 8.9–10.3)
Creatinine, Ser: 1.02 mg/dL (ref 0.61–1.24)
GFR calc non Af Amer: >60 mL/min (ref >60)
GLUCOSE: 95 mg/dL (ref 65–99)
Potassium: 4.5 mmol/L (ref 3.5–5.1)
SODIUM: 139 mmol/L (ref 135–145)
Total Bilirubin: 0.6 mg/dL (ref 0.3–1.2)
Total Protein: 6.7 g/dL (ref 6.5–8.1)

## 2017-10-07 LAB — HEMOGLOBIN A1C
HEMOGLOBIN A1C: 5.9 % — AB (ref 4.8–5.6)
Mean Plasma Glucose: 122.63 mg/dL

## 2017-10-07 LAB — PROTIME-INR
INR: 1.04
Prothrombin Time: 13.5 seconds (ref 11.4–15.2)

## 2017-10-07 LAB — SURGICAL PCR SCREEN
MRSA, PCR: NEGATIVE
Staphylococcus aureus: NEGATIVE

## 2017-10-07 LAB — APTT: APTT: 26 s (ref 24–36)

## 2017-10-10 MED ORDER — TRANEXAMIC ACID 1000 MG/10ML IV SOLN
1000.0000 mg | INTRAVENOUS | Status: AC
  Administered 2017-10-11: 1000 mg via INTRAVENOUS
  Filled 2017-10-10: qty 1100

## 2017-10-10 MED ORDER — BUPIVACAINE LIPOSOME 1.3 % IJ SUSP
20.0000 mL | Freq: Once | INTRAMUSCULAR | Status: DC
Start: 2017-10-11 — End: 2017-10-11
  Filled 2017-10-10: qty 20

## 2017-10-11 ENCOUNTER — Encounter (HOSPITAL_COMMUNITY)
Admission: RE | Disposition: A | Discharge status: Home / Self Care | Payer: Self-pay | Source: Ambulatory Visit | Attending: Orthopedic Surgery

## 2017-10-11 ENCOUNTER — Inpatient Hospital Stay (HOSPITAL_COMMUNITY): Payer: Medicare Other | Admitting: Anesthesiology

## 2017-10-11 ENCOUNTER — Inpatient Hospital Stay (HOSPITAL_COMMUNITY)
Admission: RE | Admit: 2017-10-11 | Discharge: 2017-10-13 | DRG: 470 | Disposition: A | Discharge status: Home / Self Care | Payer: Medicare Other | Source: Ambulatory Visit | Attending: Orthopedic Surgery | Admitting: Orthopedic Surgery

## 2017-10-11 ENCOUNTER — Encounter (HOSPITAL_COMMUNITY): Payer: Self-pay

## 2017-10-11 DIAGNOSIS — Z96651 Presence of right artificial knee joint: Secondary | ICD-10-CM | POA: Diagnosis not present

## 2017-10-11 DIAGNOSIS — M1711 Unilateral primary osteoarthritis, right knee: Secondary | ICD-10-CM | POA: Diagnosis not present

## 2017-10-11 DIAGNOSIS — G8918 Other acute postprocedural pain: Secondary | ICD-10-CM | POA: Diagnosis not present

## 2017-10-11 DIAGNOSIS — M1710 Unilateral primary osteoarthritis, unspecified knee: Secondary | ICD-10-CM | POA: Diagnosis present

## 2017-10-11 DIAGNOSIS — Z79899 Other long term (current) drug therapy: Secondary | ICD-10-CM

## 2017-10-11 DIAGNOSIS — E785 Hyperlipidemia, unspecified: Secondary | ICD-10-CM | POA: Diagnosis not present

## 2017-10-11 DIAGNOSIS — I10 Essential (primary) hypertension: Secondary | ICD-10-CM | POA: Diagnosis not present

## 2017-10-11 DIAGNOSIS — Z791 Long term (current) use of non-steroidal anti-inflammatories (NSAID): Secondary | ICD-10-CM | POA: Diagnosis not present

## 2017-10-11 DIAGNOSIS — M179 Osteoarthritis of knee, unspecified: Secondary | ICD-10-CM | POA: Diagnosis present

## 2017-10-11 HISTORY — PX: TOTAL KNEE ARTHROPLASTY: SHX125

## 2017-10-11 LAB — TYPE AND SCREEN
ABO/RH(D): A POS
Antibody Screen: NEGATIVE

## 2017-10-11 SURGERY — ARTHROPLASTY, KNEE, TOTAL
Anesthesia: General | Site: Knee | Laterality: Right

## 2017-10-11 MED ORDER — ONDANSETRON HCL 4 MG/2ML IJ SOLN: 4.0000 mg | Freq: Four times a day (QID) | INTRAMUSCULAR | Status: DC | PRN

## 2017-10-11 MED ORDER — METHOCARBAMOL 1000 MG/10ML IJ SOLN
500.0000 mg | Freq: Four times a day (QID) | INTRAVENOUS | Status: DC | PRN
  Filled 2017-10-11: qty 5

## 2017-10-11 MED ORDER — ACETAMINOPHEN 325 MG PO TABS
650.0000 mg | ORAL_TABLET | ORAL | Status: DC | PRN
  Administered 2017-10-12: 650 mg via ORAL
  Filled 2017-10-11: qty 2

## 2017-10-11 MED ORDER — DEXAMETHASONE SODIUM PHOSPHATE 10 MG/ML IJ SOLN
10.0000 mg | Freq: Once | INTRAMUSCULAR | Status: AC
  Administered 2017-10-12: 10 mg via INTRAVENOUS
  Filled 2017-10-11: qty 1

## 2017-10-11 MED ORDER — PROPOFOL 10 MG/ML IV BOLUS
INTRAVENOUS | Status: DC | PRN
  Administered 2017-10-11: 20 mg via INTRAVENOUS
  Administered 2017-10-11: 30 mg via INTRAVENOUS

## 2017-10-11 MED ORDER — PROPOFOL 10 MG/ML IV BOLUS
INTRAVENOUS | Status: AC
Start: 2017-10-11 — End: 2017-10-11
  Filled 2017-10-11: qty 60

## 2017-10-11 MED ORDER — METOCLOPRAMIDE HCL 5 MG PO TABS: 5.0000 mg | ORAL_TABLET | Freq: Three times a day (TID) | ORAL | Status: DC | PRN

## 2017-10-11 MED ORDER — FINASTERIDE 5 MG PO TABS
5.0000 mg | ORAL_TABLET | Freq: Every day | ORAL | Status: DC
  Administered 2017-10-13: 08:00:00 5 mg via ORAL
  Filled 2017-10-11: qty 1

## 2017-10-11 MED ORDER — MENTHOL 3 MG MT LOZG: 1.0000 | LOZENGE | OROMUCOSAL | Status: DC | PRN

## 2017-10-11 MED ORDER — OXYCODONE HCL 5 MG PO TABS
5.0000 mg | ORAL_TABLET | ORAL | Status: DC | PRN
  Administered 2017-10-12 – 2017-10-13 (×3): 5 mg via ORAL
  Administered 2017-10-13: 08:00:00 10 mg via ORAL
  Administered 2017-10-13: 5 mg via ORAL
  Filled 2017-10-11 (×6): qty 1

## 2017-10-11 MED ORDER — ONDANSETRON HCL 4 MG PO TABS: 4.0000 mg | ORAL_TABLET | Freq: Four times a day (QID) | ORAL | Status: DC | PRN

## 2017-10-11 MED ORDER — BUPIVACAINE LIPOSOME 1.3 % IJ SUSP
INTRAMUSCULAR | Status: DC | PRN
Start: 2017-10-11 — End: 2017-10-11
  Administered 2017-10-11: 20 mL

## 2017-10-11 MED ORDER — DOCUSATE SODIUM 100 MG PO CAPS
100.0000 mg | ORAL_CAPSULE | Freq: Two times a day (BID) | ORAL | Status: DC
  Administered 2017-10-11 – 2017-10-13 (×4): 100 mg via ORAL
  Filled 2017-10-11 (×4): qty 1

## 2017-10-11 MED ORDER — PHENOL 1.4 % MT LIQD: 1.0000 | OROMUCOSAL | Status: DC | PRN

## 2017-10-11 MED ORDER — METOCLOPRAMIDE HCL 5 MG/ML IJ SOLN: 5.0000 mg | Freq: Three times a day (TID) | INTRAMUSCULAR | Status: DC | PRN

## 2017-10-11 MED ORDER — DEXAMETHASONE SODIUM PHOSPHATE 10 MG/ML IJ SOLN
10.0000 mg | Freq: Once | INTRAMUSCULAR | Status: AC
  Administered 2017-10-11: 10 mg via INTRAVENOUS

## 2017-10-11 MED ORDER — LOSARTAN POTASSIUM 50 MG PO TABS
50.0000 mg | ORAL_TABLET | Freq: Every day | ORAL | Status: DC
  Administered 2017-10-13: 08:00:00 50 mg via ORAL
  Filled 2017-10-11: qty 1

## 2017-10-11 MED ORDER — ACETAMINOPHEN 10 MG/ML IV SOLN
1000.0000 mg | Freq: Once | INTRAVENOUS | Status: AC
  Administered 2017-10-11: 1000 mg via INTRAVENOUS
  Filled 2017-10-11: qty 100

## 2017-10-11 MED ORDER — CEFAZOLIN SODIUM-DEXTROSE 2-4 GM/100ML-% IV SOLN
2.0000 g | INTRAVENOUS | Status: AC
  Administered 2017-10-11: 2 g via INTRAVENOUS
  Filled 2017-10-11: qty 100

## 2017-10-11 MED ORDER — DIPHENHYDRAMINE HCL 12.5 MG/5ML PO ELIX: 12.5000 mg | ORAL_SOLUTION | ORAL | Status: DC | PRN

## 2017-10-11 MED ORDER — STERILE WATER FOR IRRIGATION IR SOLN
Status: DC | PRN
  Administered 2017-10-11: 2000 mL

## 2017-10-11 MED ORDER — BISACODYL 10 MG RE SUPP: 10.0000 mg | Freq: Every day | RECTAL | Status: DC | PRN

## 2017-10-11 MED ORDER — SODIUM CHLORIDE 0.9 % IV SOLN
INTRAVENOUS | Status: DC
  Administered 2017-10-11: 23:00:00 via INTRAVENOUS

## 2017-10-11 MED ORDER — TAMSULOSIN HCL 0.4 MG PO CAPS
0.4000 mg | ORAL_CAPSULE | Freq: Every day | ORAL | Status: DC
  Administered 2017-10-13: 08:00:00 0.4 mg via ORAL
  Filled 2017-10-11: qty 1

## 2017-10-11 MED ORDER — ACETAMINOPHEN 500 MG PO TABS
1000.0000 mg | ORAL_TABLET | Freq: Four times a day (QID) | ORAL | Status: AC
  Administered 2017-10-11 – 2017-10-12 (×4): 1000 mg via ORAL
  Filled 2017-10-11 (×4): qty 2

## 2017-10-11 MED ORDER — MIDAZOLAM HCL 2 MG/2ML IJ SOLN
1.0000 mg | INTRAMUSCULAR | Status: DC
  Administered 2017-10-11: 1.5 mg via INTRAVENOUS
  Filled 2017-10-11: qty 2

## 2017-10-11 MED ORDER — FENTANYL CITRATE (PF) 100 MCG/2ML IJ SOLN: 25.0000 ug | INTRAMUSCULAR | Status: DC | PRN

## 2017-10-11 MED ORDER — FLEET ENEMA 7-19 GM/118ML RE ENEM: 1.0000 | ENEMA | Freq: Once | RECTAL | Status: DC | PRN

## 2017-10-11 MED ORDER — PROPOFOL 500 MG/50ML IV EMUL
INTRAVENOUS | Status: DC | PRN
  Administered 2017-10-11: 100 ug/kg/min via INTRAVENOUS

## 2017-10-11 MED ORDER — LACTATED RINGERS IV SOLN
INTRAVENOUS | Status: DC
  Administered 2017-10-11 (×3): via INTRAVENOUS

## 2017-10-11 MED ORDER — TRANEXAMIC ACID 1000 MG/10ML IV SOLN
1000.0000 mg | Freq: Once | INTRAVENOUS | Status: AC
  Administered 2017-10-11: 20:00:00 1000 mg via INTRAVENOUS
  Filled 2017-10-11: qty 1100

## 2017-10-11 MED ORDER — PHENYLEPHRINE HCL 10 MG/ML IJ SOLN
INTRAVENOUS | Status: DC | PRN
  Administered 2017-10-11: 50 ug/min via INTRAVENOUS

## 2017-10-11 MED ORDER — PHENYLEPHRINE HCL 10 MG/ML IJ SOLN
INTRAMUSCULAR | Status: AC
  Filled 2017-10-11: qty 1

## 2017-10-11 MED ORDER — CEFAZOLIN SODIUM-DEXTROSE 2-4 GM/100ML-% IV SOLN
2.0000 g | Freq: Four times a day (QID) | INTRAVENOUS | Status: AC
  Administered 2017-10-11 – 2017-10-12 (×2): 2 g via INTRAVENOUS
  Filled 2017-10-11 (×2): qty 100

## 2017-10-11 MED ORDER — CHLORHEXIDINE GLUCONATE 4 % EX LIQD: 60.0000 mL | Freq: Once | CUTANEOUS | Status: DC

## 2017-10-11 MED ORDER — METHOCARBAMOL 500 MG PO TABS
500.0000 mg | ORAL_TABLET | Freq: Four times a day (QID) | ORAL | Status: DC | PRN
  Administered 2017-10-13: 500 mg via ORAL
  Filled 2017-10-11: qty 1

## 2017-10-11 MED ORDER — MORPHINE SULFATE (PF) 2 MG/ML IV SOLN: 1.0000 mg | INTRAVENOUS | Status: DC | PRN

## 2017-10-11 MED ORDER — SODIUM CHLORIDE 0.9 % IR SOLN
Status: DC | PRN
  Administered 2017-10-11: 1000 mL

## 2017-10-11 MED ORDER — ACETAMINOPHEN 650 MG RE SUPP: 650.0000 mg | RECTAL | Status: DC | PRN

## 2017-10-11 MED ORDER — TRAMADOL HCL 50 MG PO TABS: 50.0000 mg | ORAL_TABLET | Freq: Four times a day (QID) | ORAL | Status: DC | PRN

## 2017-10-11 MED ORDER — SODIUM CHLORIDE 0.9 % IJ SOLN
INTRAMUSCULAR | Status: AC
  Filled 2017-10-11: qty 50

## 2017-10-11 MED ORDER — POLYETHYLENE GLYCOL 3350 17 G PO PACK: 17.0000 g | PACK | Freq: Every day | ORAL | Status: DC | PRN

## 2017-10-11 MED ORDER — DEXAMETHASONE SODIUM PHOSPHATE 10 MG/ML IJ SOLN
INTRAMUSCULAR | Status: AC
  Filled 2017-10-11: qty 1

## 2017-10-11 MED ORDER — RIVAROXABAN 10 MG PO TABS
10.0000 mg | ORAL_TABLET | Freq: Every day | ORAL | Status: DC
  Administered 2017-10-12 – 2017-10-13 (×2): 10 mg via ORAL
  Filled 2017-10-11 (×2): qty 1

## 2017-10-11 MED ORDER — ONDANSETRON HCL 4 MG/2ML IJ SOLN
INTRAMUSCULAR | Status: DC | PRN
  Administered 2017-10-11: 4 mg via INTRAVENOUS

## 2017-10-11 MED ORDER — FENTANYL CITRATE (PF) 100 MCG/2ML IJ SOLN
50.0000 ug | INTRAMUSCULAR | Status: DC
  Administered 2017-10-11: 50 ug via INTRAVENOUS
  Filled 2017-10-11: qty 2

## 2017-10-11 MED ORDER — PHENYLEPHRINE 40 MCG/ML (10ML) SYRINGE FOR IV PUSH (FOR BLOOD PRESSURE SUPPORT)
PREFILLED_SYRINGE | INTRAVENOUS | Status: DC | PRN
  Administered 2017-10-11: 80 ug via INTRAVENOUS

## 2017-10-11 MED ORDER — 0.9 % SODIUM CHLORIDE (POUR BTL) OPTIME
TOPICAL | Status: DC | PRN
  Administered 2017-10-11: 1000 mL

## 2017-10-11 MED ORDER — ONDANSETRON HCL 4 MG/2ML IJ SOLN
INTRAMUSCULAR | Status: AC
  Filled 2017-10-11: qty 2

## 2017-10-11 MED ORDER — OXYCODONE HCL 5 MG PO TABS
10.0000 mg | ORAL_TABLET | ORAL | Status: DC | PRN
  Administered 2017-10-12 (×2): 10 mg via ORAL
  Filled 2017-10-11 (×2): qty 2

## 2017-10-11 MED ORDER — SODIUM CHLORIDE 0.9 % IJ SOLN
INTRAMUSCULAR | Status: AC
  Filled 2017-10-11: qty 10

## 2017-10-11 MED ORDER — SODIUM CHLORIDE 0.9 % IJ SOLN
INTRAMUSCULAR | Status: DC | PRN
  Administered 2017-10-11: 60 mL

## 2017-10-11 SURGICAL SUPPLY — 50 items
BAG DECANTER FOR FLEXI CONT (MISCELLANEOUS) ×1 IMPLANT
BAG SPEC THK2 15X12 ZIP CLS (MISCELLANEOUS) ×1
BAG ZIPLOCK 12X15 (MISCELLANEOUS) ×3 IMPLANT
BANDAGE ACE 6X5 VEL STRL LF (GAUZE/BANDAGES/DRESSINGS) ×3 IMPLANT
BLADE SAG 18X100X1.27 (BLADE) ×3 IMPLANT
BLADE SAW SGTL 11.0X1.19X90.0M (BLADE) ×3 IMPLANT
BOWL SMART MIX CTS (DISPOSABLE) ×3 IMPLANT
CAP KNEE TOTAL 3 SIGMA ×2 IMPLANT
CEMENT HV SMART SET (Cement) ×6 IMPLANT
CLOSURE WOUND 1/2 X4 (GAUZE/BANDAGES/DRESSINGS) ×2
COVER SURGICAL LIGHT HANDLE (MISCELLANEOUS) ×3 IMPLANT
CUFF TOURN SGL QUICK 34 (TOURNIQUET CUFF) ×3
CUFF TRNQT CYL 34X4X40X1 (TOURNIQUET CUFF) ×1 IMPLANT
DECANTER SPIKE VIAL GLASS SM (MISCELLANEOUS) ×3 IMPLANT
DRAPE U-SHAPE 47X51 STRL (DRAPES) ×3 IMPLANT
DRSG ADAPTIC 3X8 NADH LF (GAUZE/BANDAGES/DRESSINGS) ×3 IMPLANT
DRSG PAD ABDOMINAL 8X10 ST (GAUZE/BANDAGES/DRESSINGS) ×3 IMPLANT
DURAPREP 26ML APPLICATOR (WOUND CARE) ×3 IMPLANT
ELECT REM PT RETURN 15FT ADLT (MISCELLANEOUS) ×3 IMPLANT
EVACUATOR 1/8 PVC DRAIN (DRAIN) ×3 IMPLANT
GAUZE SPONGE 4X4 12PLY STRL (GAUZE/BANDAGES/DRESSINGS) ×3 IMPLANT
GLOVE BIO SURGEON STRL SZ7.5 (GLOVE) ×2 IMPLANT
GLOVE BIO SURGEON STRL SZ8 (GLOVE) ×3 IMPLANT
GLOVE BIOGEL PI IND STRL 6.5 (GLOVE) IMPLANT
GLOVE BIOGEL PI IND STRL 8 (GLOVE) ×1 IMPLANT
GLOVE BIOGEL PI INDICATOR 6.5 (GLOVE)
GLOVE BIOGEL PI INDICATOR 8 (GLOVE) ×4
GLOVE SURG SS PI 6.5 STRL IVOR (GLOVE) IMPLANT
GOWN STRL REUS W/TWL LRG LVL3 (GOWN DISPOSABLE) ×3 IMPLANT
GOWN STRL REUS W/TWL XL LVL3 (GOWN DISPOSABLE) ×2 IMPLANT
HANDPIECE INTERPULSE COAX TIP (DISPOSABLE) ×3
IMMOBILIZER KNEE 20 (SOFTGOODS) ×3
IMMOBILIZER KNEE 20 THIGH 36 (SOFTGOODS) ×1 IMPLANT
MANIFOLD NEPTUNE II (INSTRUMENTS) ×3 IMPLANT
NS IRRIG 1000ML POUR BTL (IV SOLUTION) ×3 IMPLANT
PACK TOTAL KNEE CUSTOM (KITS) ×3 IMPLANT
PADDING CAST COTTON 6X4 STRL (CAST SUPPLIES) ×7 IMPLANT
POSITIONER SURGICAL ARM (MISCELLANEOUS) ×3 IMPLANT
SET HNDPC FAN SPRY TIP SCT (DISPOSABLE) ×1 IMPLANT
STRIP CLOSURE SKIN 1/2X4 (GAUZE/BANDAGES/DRESSINGS) ×4 IMPLANT
SUT MNCRL AB 4-0 PS2 18 (SUTURE) ×3 IMPLANT
SUT STRATAFIX 0 PDS 27 VIOLET (SUTURE) ×3
SUT VIC AB 2-0 CT1 27 (SUTURE) ×9
SUT VIC AB 2-0 CT1 TAPERPNT 27 (SUTURE) ×3 IMPLANT
SUTURE STRATFX 0 PDS 27 VIOLET (SUTURE) ×1 IMPLANT
SYR 30ML LL (SYRINGE) ×6 IMPLANT
TRAY FOLEY W/METER SILVER 16FR (SET/KITS/TRAYS/PACK) ×3 IMPLANT
WATER STERILE IRR 1000ML POUR (IV SOLUTION) ×6 IMPLANT
WRAP KNEE MAXI GEL POST OP (GAUZE/BANDAGES/DRESSINGS) ×3 IMPLANT
YANKAUER SUCT BULB TIP 10FT TU (MISCELLANEOUS) ×3 IMPLANT

## 2017-10-11 NOTE — Anesthesia Preprocedure Evaluation (Addendum)
Anesthesia Evaluation  Patient identified by MRN, date of birth, ID band Patient awake    Reviewed: Allergy & Precautions, NPO status , Patient's Chart, lab work & pertinent test results  Airway Mallampati: II  TM Distance: >3 FB     Dental   Pulmonary asthma ,    breath sounds clear to auscultation       Cardiovascular hypertension, + Peripheral Vascular Disease   Rhythm:Regular Rate:Normal     Neuro/Psych    GI/Hepatic negative GI ROS, Neg liver ROS,   Endo/Other  negative endocrine ROS  Renal/GU negative Renal ROS     Musculoskeletal  (+) Arthritis ,   Abdominal   Peds  Hematology   Anesthesia Other Findings   Reproductive/Obstetrics                             Anesthesia Physical Anesthesia Plan  ASA: III  Anesthesia Plan: General and Spinal   Post-op Pain Management:  Regional for Post-op pain   Induction: Intravenous  PONV Risk Score and Plan: 2 and Treatment may vary due to age or medical condition, Ondansetron, Dexamethasone and Propofol infusion  Airway Management Planned: Nasal Cannula and Mask  Additional Equipment:   Intra-op Plan:   Post-operative Plan:   Informed Consent: I have reviewed the patients History and Physical, chart, labs and discussed the procedure including the risks, benefits and alternatives for the proposed anesthesia with the patient or authorized representative who has indicated his/her understanding and acceptance.   Dental advisory given  Plan Discussed with: CRNA and Anesthesiologist  Anesthesia Plan Comments:         Anesthesia Quick Evaluation

## 2017-10-11 NOTE — Progress Notes (Signed)
Assisted Dr.gREEN with right, ultrasound guided, interscalene , adductor canal block. Side rails up, monitors on throughout procedure. See vital signs in flow sheet. Tolerated Procedure well. Pt is resting calmly and has no s/s of distress. VS and orders assessed and pt denies additional complaints. Will continue to monitor and tx pt according to MD orders.

## 2017-10-11 NOTE — Discharge Instructions (Addendum)
° °Dr. Frank Aluisio °Total Joint Specialist °[facility] °[address] °[place], [place] [address] °[phone] ° °TOTAL KNEE REPLACEMENT POSTOPERATIVE DIRECTIONS ° °Knee Rehabilitation, Guidelines Following Surgery  °Results after knee surgery are often greatly improved when you follow the exercise, range of motion and muscle strengthening exercises prescribed by your doctor. Safety measures are also important to protect the knee from further injury. Any time any of these exercises cause you to have increased pain or swelling in your knee joint, decrease the amount until you are comfortable again and slowly increase them. If you have problems or questions, call your caregiver or physical therapist for advice.  ° °HOME CARE INSTRUCTIONS  °Remove items at home which could result in a fall. This includes throw rugs or furniture in walking pathways.  °· ICE to the affected knee every three hours for 30 minutes at a time and then as needed for pain and swelling.  Continue to use ice on the knee for pain and swelling from surgery. You may notice swelling that will progress down to the foot and ankle.  This is normal after surgery.  Elevate the leg when you are not up walking on it.   °· Continue to use the breathing machine which will help keep your temperature down.  It is common for your temperature to cycle up and down following surgery, especially at night when you are not up moving around and exerting yourself.  The breathing machine keeps your lungs expanded and your temperature down. °· Do not place pillow under knee, focus on keeping the knee straight while resting ° °DIET °You may resume your previous home diet once your are discharged from the hospital. ° °DRESSING / WOUND CARE / SHOWERING °You may shower 3 days after surgery, but keep the wounds dry during showering.  You may use an occlusive plastic wrap (Press'n Seal for example), NO SOAKING/SUBMERGING IN THE BATHTUB.  If the bandage gets  wet, change with a clean dry gauze.  If the incision gets wet, pat the wound dry with a clean towel. °You may start showering once you are discharged home but do not submerge the incision under water. Just pat the incision dry and apply a dry gauze dressing on daily. °Change the surgical dressing daily and reapply a dry dressing each time. ° °ACTIVITY °Walk with your walker as instructed. °Use walker as long as suggested by your caregivers. °Avoid periods of inactivity such as sitting longer than an hour when not asleep. This helps prevent blood clots.  °You may resume a sexual relationship in one month or when given the OK by your doctor.  °You may return to work once you are cleared by your doctor.  °Do not drive a car for 6 weeks or until released by you surgeon.  °Do not drive while taking narcotics. ° °WEIGHT BEARING °Weight bearing as tolerated with assist device (walker, cane, etc) as directed, use it as long as suggested by your surgeon or therapist, typically at least 4-6 weeks. ° °POSTOPERATIVE CONSTIPATION PROTOCOL °Constipation - defined medically as fewer than three stools per week and severe constipation as less than one stool per week. ° °One of the most common issues patients have following surgery is constipation.  Even if you have a regular bowel pattern at home, your normal regimen is likely to be disrupted due to multiple reasons following surgery.  Combination of anesthesia, postoperative narcotics, change in appetite and fluid intake all can affect your bowels.    In order to avoid complications following surgery, here are some recommendations in order to help you during your recovery period. ° °Colace (docusate) - Pick up an over-the-counter form of Colace or another stool softener and take twice a day as long as you are requiring postoperative pain medications.  Take with a full glass of water daily.  If you experience loose stools or diarrhea, hold the colace until you stool forms back up.  If  your symptoms do not get better within 1 week or if they get worse, check with your doctor. ° °Dulcolax (bisacodyl) - Pick up over-the-counter and take as directed by the product packaging as needed to assist with the movement of your bowels.  Take with a full glass of water.  Use this product as needed if not relieved by Colace only.  ° °MiraLax (polyethylene glycol) - Pick up over-the-counter to have on hand.  MiraLax is a solution that will increase the amount of water in your bowels to assist with bowel movements.  Take as directed and can mix with a glass of water, juice, soda, coffee, or tea.  Take if you go more than two days without a movement. °Do not use MiraLax more than once per day. Call your doctor if you are still constipated or irregular after using this medication for 7 days in a row. ° °If you continue to have problems with postoperative constipation, please contact the office for further assistance and recommendations.  If you experience "the worst abdominal pain ever" or develop nausea or vomiting, please contact the office immediatly for further recommendations for treatment. ° °ITCHING ° If you experience itching with your medications, try taking only a single pain pill, or even half a pain pill at a time.  You can also use Benadryl over the counter for itching or also to help with sleep.  ° °TED HOSE STOCKINGS °Wear the elastic stockings on both legs for three weeks following surgery during the day but you may remove then at night for sleeping. ° °MEDICATIONS °See your medication summary on the “After Visit Summary” that the nursing staff will review with you prior to discharge.  You may have some home medications which will be placed on hold until you complete the course of blood thinner medication.  It is important for you to complete the blood thinner medication as prescribed by your surgeon.  Continue your approved medications as instructed at time of discharge. ° °PRECAUTIONS °If you  experience chest pain or shortness of breath - call 911 immediately for transfer to the hospital emergency department.  °If you develop a fever greater that 101 F, purulent drainage from wound, increased redness or drainage from wound, foul odor from the wound/dressing, or calf pain - CONTACT YOUR SURGEON.   °                                                °FOLLOW-UP APPOINTMENTS °Make sure you keep all of your appointments after your operation with your surgeon and caregivers. You should call the office at the above phone number and make an appointment for approximately two weeks after the date of your surgery or on the date instructed by your surgeon outlined in the "After Visit Summary". ° ° °RANGE OF MOTION AND STRENGTHENING EXERCISES  °Rehabilitation of the knee is important following a knee injury or   an operation. After just a few days of immobilization, the muscles of the thigh which control the knee become weakened and shrink (atrophy). Knee exercises are designed to build up the tone and strength of the thigh muscles and to improve knee motion. Often times heat used for twenty to thirty minutes before working out will loosen up your tissues and help with improving the range of motion but do not use heat for the first two weeks following surgery. These exercises can be done on a training (exercise) mat, on the floor, on a table or on a bed. Use what ever works the best and is most comfortable for you Knee exercises include:  °Leg Lifts - While your knee is still immobilized in a splint or cast, you can do straight leg raises. Lift the leg to 60 degrees, hold for 3 sec, and slowly lower the leg. Repeat 10-20 times 2-3 times daily. Perform this exercise against resistance later as your knee gets better.  °Quad and Hamstring Sets - Tighten up the muscle on the front of the thigh (Quad) and hold for 5-10 sec. Repeat this 10-20 times hourly. Hamstring sets are done by pushing the foot backward against an object  and holding for 5-10 sec. Repeat as with quad sets.  °· Leg Slides: Lying on your back, slowly slide your foot toward your buttocks, bending your knee up off the floor (only go as far as is comfortable). Then slowly slide your foot back down until your leg is flat on the floor again. °· Angel Wings: Lying on your back spread your legs to the side as far apart as you can without causing discomfort.  °A rehabilitation program following serious knee injuries can speed recovery and prevent re-injury in the future due to weakened muscles. Contact your doctor or a physical therapist for more information on knee rehabilitation.  ° °IF YOU ARE TRANSFERRED TO A SKILLED REHAB FACILITY °If the patient is transferred to a skilled rehab facility following release from the hospital, a list of the current medications will be sent to the facility for the patient to continue.  When discharged from the skilled rehab facility, please have the facility set up the patient's Home Health Physical Therapy prior to being released. Also, the skilled facility will be responsible for providing the patient with their medications at time of release from the facility to include their pain medication, the muscle relaxants, and their blood thinner medication. If the patient is still at the rehab facility at time of the two week follow up appointment, the skilled rehab facility will also need to assist the patient in arranging follow up appointment in our office and any transportation needs. ° °MAKE SURE YOU:  °Understand these instructions.  °Get help right away if you are not doing well or get worse.  ° ° °Pick up stool softner and laxative for home use following surgery while on pain medications. °Do not submerge incision under water. °Please use good hand washing techniques while changing dressing each day. °May shower starting three days after surgery. °Please use a clean towel to pat the incision dry following showers. °Continue to use ice for  pain and swelling after surgery. °Do not use any lotions or creams on the incision until instructed by your surgeon. ° °Take Xarelto for two and a half more weeks following discharge from the hospital, then discontinue Xarelto. °Once the patient has completed the blood thinner regimen, then take a Baby 81 mg Aspirin daily for three   more weeks.    Information on my medicine - XARELTO (Rivaroxaban)  This medication education was reviewed with me or my healthcare representative as part of my discharge preparation.  Why was Xarelto prescribed for you? Xarelto was prescribed for you to reduce the risk of blood clots forming after orthopedic surgery. The medical term for these abnormal blood clots is venous thromboembolism (VTE).  What do you need to know about xarelto ? Take your Xarelto ONCE DAILY at the same time every day. You may take it either with or without food.  If you have difficulty swallowing the tablet whole, you may crush it and mix in applesauce just prior to taking your dose.  Take Xarelto exactly as prescribed by your doctor and DO NOT stop taking Xarelto without talking to the doctor who prescribed the medication.  Stopping without other VTE prevention medication to take the place of Xarelto may increase your risk of developing a clot.  After discharge, you should have regular check-up appointments with your healthcare provider that is prescribing your Xarelto.    What do you do if you miss a dose? If you miss a dose, take it as soon as you remember on the same day then continue your regularly scheduled once daily regimen the next day. Do not take two doses of Xarelto on the same day.   Important Safety Information A possible side effect of Xarelto is bleeding. You should call your healthcare provider right away if you experience any of the following: ? Bleeding from an injury or your nose that does not stop. ? Unusual colored urine (red or dark brown) or unusual  colored stools (red or black). ? Unusual bruising for unknown reasons. ? A serious fall or if you hit your head (even if there is no bleeding).  Some medicines may interact with Xarelto and might increase your risk of bleeding while on Xarelto. To help avoid this, consult your healthcare provider or pharmacist prior to using any new prescription or non-prescription medications, including herbals, vitamins, non-steroidal anti-inflammatory drugs (NSAIDs) and supplements.  This website has more information on Xarelto: [email or web address]

## 2017-10-11 NOTE — Interval H&P Note (Signed)
History and Physical Interval Note:  10/11/2017 1:26 PM  Carlos Baldwin  has presented today for surgery, with the diagnosis of Osteoarthritis Right Knee  The various methods of treatment have been discussed with the patient and family. After consideration of risks, benefits and other options for treatment, the patient has consented to  Procedure(s): RIGHT TOTAL KNEE ARTHROPLASTY (Right) as a surgical intervention .  The patient's history has been reviewed, patient examined, no change in status, stable for surgery.  I have reviewed the patient's chart and labs.  Questions were answered to the patient's satisfaction.     [name]

## 2017-10-11 NOTE — Anesthesia Procedure Notes (Signed)
Anesthesia Regional Block: Adductor canal block   Pre-Anesthetic Checklist: ,, timeout performed, Correct Patient, Correct Site, Correct Laterality, Correct Procedure, Correct Position, site marked, Risks and benefits discussed,  Surgical consent,  Pre-op evaluation,  At surgeon's request and post-op pain management  Laterality: Right  Prep: chloraprep       Needles:  Injection technique: Single-shot  Needle Type: Stimulator Needle - 80          Additional Needles:   Procedures: Doppler guided,,,, ultrasound used (permanent image in chart),,,,  Narrative:  Start time: 10/11/2017 2:40 PM End time: 10/11/2017 2:55 PM Injection made incrementally with aspirations every 5 mL. Anesthesiologist: Belinda Block, MD

## 2017-10-11 NOTE — Anesthesia Procedure Notes (Signed)
Spinal  Patient location during procedure: OR Staffing Anesthesiologist: Lyndle Herrlich, MD Spinal Block Patient position: sitting Prep: DuraPrep Patient monitoring: heart rate, blood pressure and continuous pulse ox Approach: right paramedian Location: L3-4 Injection technique: single-shot Needle Needle type: Sprotte  Needle gauge: 24 G Needle length: 9 cm Assessment Sensory level: T4 Additional Notes Spinal Dosage in OR  .75% Bupivicaine ml       1.9 RLD x 3 min

## 2017-10-11 NOTE — Op Note (Signed)
OPERATIVE REPORT-TOTAL KNEE ARTHROPLASTY   Pre-operative diagnosis- Osteoarthritis  Right knee(s)  Post-operative diagnosis- Osteoarthritis Right knee(s)  Procedure-  Right  Total Knee Arthroplasty  Surgeon- Dione Plover. Kaila Devries, MD  Assistant- Arlee Muslim, PA-C   Anesthesia-  Adductor canal block and spinal  EBL- 25 ml   Drains Hemovac  Tourniquet time-  Total Tourniquet Time Documented: Thigh (Right) - 38 minutes Total: Thigh (Right) - 38 minutes     Complications- None  Condition-PACU - hemodynamically stable.   Brief Clinical Note  Carlos Baldwin is a 83 y.o. year old male with end stage OA of his right knee with progressively worsening pain and dysfunction. He has constant pain, with activity and at rest and significant functional deficits with difficulties even with ADLs. He has had extensive non-op management including analgesics, injections of cortisone and viscosupplements, and home exercise program, but remains in significant pain with significant dysfunction. Radiographs show bone on bone arthritis medial and patellofemoral. He presents now for right Total Knee Arthroplasty.    Procedure in detail---   The patient is brought into the operating room and positioned supine on the operating table. After successful administration of  Adductor canal block and spina,   a tourniquet is placed high on the  Right thigh(s) and the lower extremity is prepped and draped in the usual sterile fashion. Time out is performed by the operating team and then the  Right lower extremity is wrapped in Esmarch, knee flexed and the tourniquet inflated to 300 mmHg.       A midline incision is made with a ten blade through the subcutaneous tissue to the level of the extensor mechanism. A fresh blade is used to make a medial parapatellar arthrotomy. Soft tissue over the proximal medial tibia is subperiosteally elevated to the joint line with a knife and into the semimembranosus bursa with a  Cobb elevator. Soft tissue over the proximal lateral tibia is elevated with attention being paid to avoiding the patellar tendon on the tibial tubercle. The patella is everted, knee flexed 90 degrees and the ACL and PCL are removed. Findings are bone on bone medial and patellofemoral with massive global osteophytes.        The drill is used to create a starting hole in the distal femur and the canal is thoroughly irrigated with sterile saline to remove the fatty contents. The 5 degree Right  valgus alignment guide is placed into the femoral canal and the distal femoral cutting block is pinned to remove 10 mm off the distal femur. Resection is made with an oscillating saw.      The tibia is subluxed forward and the menisci are removed. The extramedullary alignment guide is placed referencing proximally at the medial aspect of the tibial tubercle and distally along the second metatarsal axis and tibial crest. The block is pinned to remove 59mm off the more deficient medial  side. Resection is made with an oscillating saw. Size 3is the most appropriate size for the tibia and the proximal tibia is prepared with the modular drill and keel punch for that size.      The femoral sizing guide is placed and size 4 is most appropriate. Rotation is marked off the epicondylar axis and confirmed by creating a rectangular flexion gap at 90 degrees. The size 4.5 cutting block is pinned in this rotation and the anterior, posterior and chamfer cuts are made with the oscillating saw. The intercondylar block is then placed and that cut is made.  Trial size 3 tibial component, trial size 4 narrow posterior stabilized femur and a 15  mm posterior stabilized rotating platform insert trial is placed. Full extension is achieved with excellent varus/valgus and anterior/posterior balance throughout full range of motion. The patella is everted and thickness measured to be 22  mm. Free hand resection is taken to 12 mm, a 38 template is  placed, lug holes are drilled, trial patella is placed, and it tracks normally. Osteophytes are removed off the posterior femur with the trial in place. All trials are removed and the cut bone surfaces prepared with pulsatile lavage. Cement is mixed and once ready for implantation, the size 3 tibial implant, size  4 narrow posterior stabilized femoral component, and the size 38 patella are cemented in place and the patella is held with the clamp. The trial insert is placed and the knee held in full extension. The Exparel (20 ml mixed with 60 ml saline) is injected into the extensor mechanism, posterior capsule, medial and lateral gutters and subcutaneous tissues.  All extruded cement is removed and once the cement is hard the permanent 15 mm posterior stabilized rotating platform insert is placed into the tibial tray.      The wound is copiously irrigated with saline solution and the extensor mechanism closed over a hemovac drain with #1 V-loc suture. The tourniquet is released for a total tourniquet time of 38  minutes. Flexion against gravity is 140 degrees and the patella tracks normally. Subcutaneous tissue is closed with 2.0 vicryl and subcuticular with running 4.0 Monocryl. The incision is cleaned and dried and steri-strips and a bulky sterile dressing are applied. The limb is placed into a knee immobilizer and the patient is awakened and transported to recovery in stable condition.      Please note that a surgical assistant was a medical necessity for this procedure in order to perform it in a safe and expeditious manner. Surgical assistant was necessary to retract the ligaments and vital neurovascular structures to prevent injury to them and also necessary for proper positioning of the limb to allow for anatomic placement of the prosthesis.   Dione Plover Brigette Hopfer, MD    10/11/2017, 5:09 PM

## 2017-10-11 NOTE — Anesthesia Procedure Notes (Signed)
Date/Time: 10/11/2017 3:50 PM Performed by: Sharlette Dense, CRNA Oxygen Delivery Method: Simple face mask

## 2017-10-11 NOTE — Transfer of Care (Signed)
Immediate Anesthesia Transfer of Care Note  Patient: Carlos Baldwin  Procedure(s) Performed: Procedure(s): RIGHT TOTAL KNEE ARTHROPLASTY (Right)  Patient Location: PACU  Anesthesia Type:Spinal  Level of Consciousness:  sedated, patient cooperative and responds to stimulation  Airway & Oxygen Therapy:Patient Spontanous Breathing and Patient connected to face mask oxgen  Post-op Assessment:  Report given to PACU RN and Post -op Vital signs reviewed and stable  Post vital signs:  Reviewed and stable  Last Vitals:  Vitals:   10/11/17 1509 10/11/17 1510  BP:  133/79  Pulse: (!) 102 96  Resp: 18 16  Temp:    SpO2: 83% 77%    Complications: No apparent anesthesia complications

## 2017-10-12 ENCOUNTER — Other Ambulatory Visit: Payer: Self-pay

## 2017-10-12 ENCOUNTER — Encounter (HOSPITAL_COMMUNITY): Payer: Self-pay | Admitting: Orthopedic Surgery

## 2017-10-12 LAB — CBC
HCT: 36.5 % — ABNORMAL LOW (ref 39.0–52.0)
Hemoglobin: 12.1 g/dL — ABNORMAL LOW (ref 13.0–17.0)
MCH: 30.2 pg (ref 26.0–34.0)
MCHC: 33.2 g/dL (ref 30.0–36.0)
MCV: 91 fL (ref 78.0–100.0)
PLATELETS: 200 10*3/uL (ref 150–400)
RBC: 4.01 MIL/uL — AB (ref 4.22–5.81)
RDW: 13.2 % (ref 11.5–15.5)
WBC: 12.1 10*3/uL — ABNORMAL HIGH (ref 4.0–10.5)

## 2017-10-12 LAB — BASIC METABOLIC PANEL
Anion gap: 7 (ref 5–15)
BUN: 20 mg/dL (ref 6–20)
CALCIUM: 8.4 mg/dL — AB (ref 8.9–10.3)
CO2: 24 mmol/L (ref 22–32)
Chloride: 108 mmol/L (ref 101–111)
Creatinine, Ser: 0.84 mg/dL (ref 0.61–1.24)
GFR calc Af Amer: >60 mL/min (ref >60)
Glucose, Bld: 137 mg/dL — ABNORMAL HIGH (ref 65–99)
POTASSIUM: 4 mmol/L (ref 3.5–5.1)
Sodium: 139 mmol/L (ref 135–145)

## 2017-10-12 MED ORDER — RIVAROXABAN 10 MG PO TABS: 10.0000 mg | ORAL_TABLET | Freq: Every day | ORAL | 0 refills | Status: DC

## 2017-10-12 MED ORDER — OXYCODONE HCL 5 MG PO TABS: 5.0000 mg | ORAL_TABLET | ORAL | 0 refills | Status: DC | PRN

## 2017-10-12 MED ORDER — METHOCARBAMOL 500 MG PO TABS: 500.0000 mg | ORAL_TABLET | Freq: Four times a day (QID) | ORAL | 0 refills | Status: DC | PRN

## 2017-10-12 MED ORDER — TRAMADOL HCL 50 MG PO TABS: 50.0000 mg | ORAL_TABLET | Freq: Four times a day (QID) | ORAL | 0 refills | Status: DC | PRN

## 2017-10-12 NOTE — Progress Notes (Signed)
Spoke with patient at bedside. Confirmed plan for OP PT, already arranged. Has RW and 3n1. [phone] 

## 2017-10-12 NOTE — Evaluation (Signed)
Physical Therapy Evaluation Patient Details Name: Carlos Baldwin MRN: [id number] DOB: 10-08-1934 Today's Date: 10/12/2017   History of Present Illness  s/p R TKA; PMHx: L TKA 10/2014  Clinical Impression  Pt is s/p TKA resulting in the deficits listed below (see PT Problem List).  Pt will benefit from skilled PT to increase their independence and safety with mobility to allow discharge to the venue listed below. Pt doing well, motivated to work with PT; plans to d/c tomorrow     Follow Up Recommendations DC plan and follow up therapy as arranged by surgeon    Equipment Recommendations  None recommended by PT    Recommendations for Other Services       Precautions / Restrictions Precautions Precautions: Fall;Knee Required Braces or Orthoses: Knee Immobilizer - Right Knee Immobilizer - Right: Discontinue once straight leg raise with < 10 degree lag Restrictions Weight Bearing Restrictions: No Other Position/Activity Restrictions: WBAT      Mobility  Bed Mobility Overal bed mobility: Needs Assistance Bed Mobility: Supine to Sit     Supine to sit: Min guard     General bed mobility comments: min/guard for RLE  Transfers Overall transfer level: Needs assistance   Transfers: Sit to/from Stand Sit to Stand: Min guard         General transfer comment: min/guard to rise and stabilize  Ambulation/Gait Ambulation/Gait assistance: Min guard;Min assist Ambulation Distance (Feet): 160 Feet Assistive device: Rolling walker (2 wheeled) Gait Pattern/deviations: Step-to pattern;Step-through pattern     General Gait Details: cues for sequence, RW position, step length  Stairs            Wheelchair Mobility    Modified Rankin (Stroke Patients Only)       Balance                                             Pertinent Vitals/Pain Pain Assessment: 0-10 Pain Score: 3  Pain Location: right knee Pain Descriptors / Indicators:  Constant;Sore Pain Intervention(s): Premedicated before session;Monitored during session    [place] expects to be discharged to:: Private residence Living Arrangements: Spouse/significant other Available Help at Discharge: Family Type of Home: House Home Access: Stairs to enter   [employer] of Steps: 2-3 Home Layout: Two level;Able to live on main level with bedroom/bathroom Home Equipment: Gilford Rile - 2 wheels;Cane - single point;Grab bars - tub/shower;Bedside commode      Prior Function Level of Independence: Independent;Independent with assistive device(s)         Comments: uses cane occasionally d/t knee pain     Hand Dominance   Dominant Hand: Right    Extremity/Trunk Assessment   Upper Extremity Assessment Upper Extremity Assessment: Defer to OT evaluation    Lower Extremity Assessment Lower Extremity Assessment: RLE deficits/detail RLE Deficits / Details: ankle WFL; knee extension and hip flexion 2+/5, knee AAROM 8* to 65*       Communication   Communication: No difficulties  Cognition Arousal/Alertness: Awake/alert Behavior During Therapy: WFL for tasks assessed/performed Overall Cognitive Status: Within Functional Limits for tasks assessed                                        General Comments      Exercises Total Joint Exercises Ankle  Circles/Pumps: AROM;Both;10 reps Quad Sets: AROM;Both;10 reps Heel Slides: AAROM;10 reps;Right Straight Leg Raises: AROM;AAROM;Right;10 reps   Assessment/Plan    PT Assessment Patient needs continued PT services  PT Problem List Decreased strength;Decreased range of motion;Decreased activity tolerance;Decreased mobility;Pain;Decreased knowledge of use of DME;Decreased knowledge of precautions       PT Treatment Interventions DME instruction;Gait training;Functional mobility training;Therapeutic activities;Therapeutic exercise;Patient/family education;Stair training     PT Goals (Current goals can be found in the Care Plan section)  Acute Rehab PT Goals Patient Stated Goal: be able to walk a longer distance without pain PT Goal Formulation: With patient Potential to Achieve Goals: Good    Frequency 7X/week   Barriers to discharge        Co-evaluation               AM-PAC PT "6 Clicks" Daily Activity  Outcome Measure Difficulty turning over in bed (including adjusting bedclothes, sheets and blankets)?: A Little Difficulty moving from lying on back to sitting on the side of the bed? : A Little Difficulty sitting down on and standing up from a chair with arms (e.g., wheelchair, bedside commode, etc,.)?: Unable Help needed moving to and from a bed to chair (including a wheelchair)?: A Little Help needed walking in hospital room?: A Little Help needed climbing 3-5 steps with a railing? : A Little 6 Click Score: 16    End of Session Equipment Utilized During Treatment: Gait belt Activity Tolerance: Patient tolerated treatment well Patient left: in chair;with call bell/phone within reach;with chair alarm set   PT Visit Diagnosis: Difficulty in walking, not elsewhere classified (R26.2)    Time: 6160-7371 PT Time Calculation (min) (ACUTE ONLY): 21 min   Charges:   PT Evaluation $PT Eval Low Complexity: 1 Low     PT G CodesKenyon Ana, PT Pager: [phone] 10/12/2017   [facility] 10/12/2017, 10:55 AM

## 2017-10-12 NOTE — Plan of Care (Signed)
Plan of care reviewed and discussed with patient and family. 

## 2017-10-12 NOTE — Anesthesia Postprocedure Evaluation (Signed)
Anesthesia Post Note  Patient: Carlos Baldwin  Procedure(s) Performed: RIGHT TOTAL KNEE ARTHROPLASTY (Right Knee)     Patient location during evaluation: PACU Anesthesia Type: Spinal Level of consciousness: awake Pain management: satisfactory to patient Vital Signs Assessment: post-procedure vital signs reviewed and stable Respiratory status: spontaneous breathing Cardiovascular status: blood pressure returned to baseline Postop Assessment: no headache and spinal receding Anesthetic complications: no    Last Vitals:  Vitals:   10/12/17 0544 10/12/17 0957  BP: (!) 144/88 (!) 144/75  Pulse: 85 88  Resp: 16 16  Temp: 36.8 C 36.7 C  SpO2: 97% 94%    Last Pain:  Vitals:   10/12/17 0957  TempSrc: Oral  PainSc:                  Riccardo Dubin

## 2017-10-12 NOTE — Progress Notes (Signed)
   Subjective: 1 Day Post-Op Procedure(s) (LRB): RIGHT TOTAL KNEE ARTHROPLASTY (Right) Patient reports pain as mild.   Patient seen in rounds for Dr. Wynelle Link on morning rounds. Patient is well, but has had some minor complaints of pain in the knee, requiring pain medications Started therapy today And walked twice Plan is to go Home after hospital stay.  Objective: Vital signs in last 24 hours: Temp:  [97.6 F (36.4 C)-98.5 F (36.9 C)] 98.5 F (36.9 C) (02/05 1721) Pulse Rate:  [80-97] 97 (02/05 1721) Resp:  [15-17] 17 (02/05 1721) BP: (141-152)/(67-88) 151/72 (02/05 1721) SpO2:  [94 %-97 %] 97 % (02/05 1721)  Intake/Output from previous day:  Intake/Output Summary (Last 24 hours) at 10/12/2017 2058 Last data filed at 10/12/2017 1942 Gross per 24 hour  Intake 3074.5 ml  Output 1460 ml  Net 1614.5 ml    Intake/Output this shift: Total I/O In: 100 [P.O.:100] Out: -   Labs: Recent Labs    10/12/17 0617  HGB 12.1*   Recent Labs    10/12/17 0617  WBC 12.1*  RBC 4.01*  HCT 36.5*  PLT 200   Recent Labs    10/12/17 0617  NA 139  K 4.0  CL 108  CO2 24  BUN 20  CREATININE 0.84  GLUCOSE 137*  CALCIUM 8.4*   No results for input(s): LABPT, INR in the last 72 hours.  EXAM General - Patient is Alert, Appropriate and Oriented Extremity - Neurovascular intact Sensation intact distally Intact pulses distally Dorsiflexion/Plantar flexion intact Dressing - dressing C/D/I Motor Function - intact, moving foot and toes well on exam.  Hemovac pulled without difficulty.  Past Medical History:  Diagnosis Date  . Arthritis    oa  . Asthma    as child  . Hyperlipidemia   . Hypertension   . Measles    as child  . Mumps    as child  . Pre-diabetes     Assessment/Plan: 1 Day Post-Op Procedure(s) (LRB): RIGHT TOTAL KNEE ARTHROPLASTY (Right) Active Problems:   OA (osteoarthritis) of knee  Estimated body mass index is 26.11 kg/m as calculated from the  following:   Height as of this encounter: 5\' 9"  (1.753 m).   Weight as of this encounter: 80.2 kg (176 lb 12.8 oz). Up with therapy Plan for discharge tomorrow   DVT Prophylaxis - Xarelto Weight-Bearing as tolerated to right leg D/C O2 and Pulse OX and try on Room Air  Arlee Muslim, PA-C Orthopaedic Surgery 10/12/2017, 8:58 PM

## 2017-10-12 NOTE — Evaluation (Signed)
Occupational Therapy Evaluation and Discharge Summary Patient Details Name: Carlos Baldwin MRN: [id number] DOB: 13-Nov-1934 Today's Date: 10/12/2017    History of Present Illness s/p R TKA; PMHx: L TKA 10/2014   Clinical Impression   Pt admitted with the above diagnosis and has overall done very well.  Pt requires min assist with a few LE adls but has his wife available to assist as needed.  Pt safe to d/c home with wife.     Follow Up Recommendations  DC plan and follow up therapy as arranged by surgeon    Equipment Recommendations  None recommended by OT    Recommendations for Other Services       Precautions / Restrictions Precautions Precautions: Fall;Knee Required Braces or Orthoses: Knee Immobilizer - Right Knee Immobilizer - Right: Discontinue once straight leg raise with < 10 degree lag Restrictions Weight Bearing Restrictions: No Other Position/Activity Restrictions: WBAT      Mobility Bed Mobility Overal bed mobility: Needs Assistance Bed Mobility: Supine to Sit     Supine to sit: Min guard     General bed mobility comments: min/guard for RLE  Transfers Overall transfer level: Needs assistance Equipment used: Rolling walker (2 wheeled) Transfers: Sit to/from Stand Sit to Stand: Min guard         General transfer comment: min/guard to rise and stabilize    Balance Overall balance assessment: Needs assistance Sitting-balance support: Feet supported Sitting balance-Leahy Scale: Good     Standing balance support: Bilateral upper extremity supported;During functional activity Standing balance-Leahy Scale: Fair Standing balance comment: Pt able to let go of walker for short moments to manage lower body clothing in standing.                           ADL either performed or assessed with clinical judgement   ADL Overall ADL's : Needs assistance/impaired Eating/Feeding: Independent;Sitting   Grooming:  Supervision/safety;Standing;Oral care;Wash/dry face;Wash/dry hands   Upper Body Bathing: Set up;Sitting   Lower Body Bathing: Minimal assistance;Sit to/from stand   Upper Body Dressing : Set up;Sitting   Lower Body Dressing: Moderate assistance;Sit to/from stand   Toilet Transfer: Min guard;Grab bars;Comfort height toilet;RW   Toileting- Water quality scientist and Hygiene: Supervision/safety;Sit to/from stand       Functional mobility during ADLs: Min guard;Rolling walker General ADL Comments: pt doing very well with adls requiring min assist for a few LE adls but wife can assist.     Vision Baseline Vision/History: No visual deficits Patient Visual Report: No change from baseline Vision Assessment?: No apparent visual deficits     Perception     Praxis      Pertinent Vitals/Pain Pain Assessment: 0-10 Pain Score: 3  Pain Location: right knee Pain Descriptors / Indicators: Constant;Sore Pain Intervention(s): Premedicated before session;Monitored during session     Hand Dominance Right   Extremity/Trunk Assessment Upper Extremity Assessment Upper Extremity Assessment: Defer to OT evaluation   Lower Extremity Assessment Lower Extremity Assessment: RLE deficits/detail RLE Deficits / Details: ankle WFL; knee extension and hip flexion 2+/5, knee AAROM 8* to 65*   Cervical / Trunk Assessment Cervical / Trunk Assessment: Normal   Communication Communication Communication: No difficulties   Cognition Arousal/Alertness: Awake/alert Behavior During Therapy: WFL for tasks assessed/performed Overall Cognitive Status: Within Functional Limits for tasks assessed  General Comments  Pt has had L TKA and recalls most technqiues for adls and should be safe to d/c home with his wife.    Exercises Total Joint Exercises Ankle Circles/Pumps: AROM;Both;10 reps Quad Sets: AROM;Both;10 reps Heel Slides: AAROM;10  reps;Right Straight Leg Raises: AROM;AAROM;Right;10 reps   Shoulder Instructions      Home Living Family/patient expects to be discharged to:: Private residence Living Arrangements: Spouse/significant other Available Help at Discharge: Family Type of Home: House Home Access: Stairs to enter [employer] of Steps: 2-3   Home Layout: Two level;Able to live on main level with bedroom/bathroom     Bathroom Shower/Tub: Tub/shower unit;Curtain   Bathroom Toilet: Standard     Home Equipment: Environmental consultant - 2 wheels;Cane - single point;Grab bars - tub/shower;Bedside commode          Prior Functioning/Environment Level of Independence: Independent;Independent with assistive device(s)        Comments: uses cane occasionally d/t knee pain        OT Problem List:        OT Treatment/Interventions:      OT Goals(Current goals can be found in the care plan section) Acute Rehab OT Goals Patient Stated Goal: be able to walk a longer distance without pain OT Goal Formulation: All assessment and education complete, DC therapy  OT Frequency:     Barriers to D/C:            Co-evaluation              AM-PAC PT "6 Clicks" Daily Activity     Outcome Measure Help from another person eating meals?: None Help from another person taking care of personal grooming?: None Help from another person toileting, which includes using toliet, bedpan, or urinal?: None Help from another person bathing (including washing, rinsing, drying)?: A Little Help from another person to put on and taking off regular upper body clothing?: None Help from another person to put on and taking off regular lower body clothing?: A Little 6 Click Score: 22   End of Session Equipment Utilized During Treatment: Rolling walker CPM Right Knee CPM Right Knee: Off Nurse Communication: Mobility status  Activity Tolerance: Patient tolerated treatment well Patient left: in chair;with call bell/phone  within reach  OT Visit Diagnosis: Unsteadiness on feet (R26.81)                Time: 3903-0092 OT Time Calculation (min): 27 min Charges:  OT General Charges $OT Visit: 1 Visit OT Evaluation $OT Eval Low Complexity: 1 Low OT Treatments $Self Care/Home Management : 8-22 mins G-Codes:     Jinger Neighbors, OTR/L [phone]  Glenford Peers 10/12/2017, 11:03 AM

## 2017-10-12 NOTE — Discharge Summary (Signed)
Physician Discharge Summary   Patient ID: Carlos Baldwin MRN: [id number] DOB/AGE: April 01, 1935 83 y.o.  Admit date: 10/11/2017 Discharge date: 10/13/2017   Primary Diagnosis:  Osteoarthritis  Right knee(s)   Admission Diagnoses:  Past Medical History:  Diagnosis Date  . Arthritis    oa  . Asthma    as child  . Hyperlipidemia   . Hypertension   . Measles    as child  . Mumps    as child  . Pre-diabetes    Discharge Diagnoses:   Active Problems:   OA (osteoarthritis) of knee  Estimated body mass index is 26.11 kg/m as calculated from the following:   Height as of this encounter: _0  (1.753 m).   Weight as of this encounter: 80.2 kg (176 lb 12.8 oz).  Procedure:  Procedure(s) (LRB): RIGHT TOTAL KNEE ARTHROPLASTY (Right)   Consults: None  HPI: Carlos Baldwin is a 83 y.o. year old male with end stage OA of his right knee with progressively worsening pain and dysfunction. He has constant pain, with activity and at rest and significant functional deficits with difficulties even with ADLs. He has had extensive non-op management including analgesics, injections of cortisone and viscosupplements, and home exercise program, but remains in significant pain with significant dysfunction. Radiographs show bone on bone arthritis medial and patellofemoral. He presents now for right Total Knee Arthroplasty.     Laboratory Data: Admission on 10/11/2017  Component Date Value Ref Range Status  . WBC 10/12/2017 12.1* 4.0 - 10.5 K/uL Final  . RBC 10/12/2017 4.01* 4.22 - 5.81 MIL/uL Final  . Hemoglobin 10/12/2017 12.1* 13.0 - 17.0 g/dL Final  . HCT 10/12/2017 36.5* 39.0 - 52.0 % Final  . MCV 10/12/2017 91.0  78.0 - 100.0 fL Final  . MCH 10/12/2017 30.2  26.0 - 34.0 pg Final  . MCHC 10/12/2017 33.2  30.0 - 36.0 g/dL Final  . RDW 10/12/2017 13.2  11.5 - 15.5 % Final  . Platelets 10/12/2017 200  150 - 400 K/uL Final   Performed at [facility], [place] [address], [place], [place] [address]  . Sodium 10/12/2017 139  135 - 145 mmol/L Final  . Potassium 10/12/2017 4.0  3.5 - 5.1 mmol/L Final  . Chloride 10/12/2017 108  101 - 111 mmol/L Final  . CO2 10/12/2017 24  22 - 32 mmol/L Final  . Glucose, Bld 10/12/2017 137* 65 - 99 mg/dL Final  . BUN 10/12/2017 20  6 - 20 mg/dL Final  . Creatinine, Ser 10/12/2017 0.84  0.61 - 1.24 mg/dL Final  . Calcium 10/12/2017 8.4* 8.9 - 10.3 mg/dL Final  . GFR calc non Af Amer 10/12/2017 >60  >60 mL/min Final  . GFR calc Af Amer 10/12/2017 >60  >60 mL/min Final   Comment: (NOTE) The eGFR has been calculated using the CKD EPI equation. This calculation has not been validated in all clinical situations. eGFR's persistently <60 mL/min signify possible Chronic Kidney Disease.   Georgiann Hahn gap 10/12/2017 7  5 - 15 Final   Performed at [facility], [place] [address], [place], [place] [address]  Hospital Outpatient Visit on 10/07/2017  Component Date Value Ref Range Status  . MRSA, PCR 10/07/2017 NEGATIVE  NEGATIVE Final  . Staphylococcus aureus 10/07/2017 NEGATIVE  NEGATIVE Final   Comment: (NOTE) The Xpert SA Assay (FDA approved for NASAL specimens in patients 51 years of age and older), is one component of a comprehensive surveillance program. It is not  intended to diagnose infection nor to guide or monitor treatment.   Marland Kitchen aPTT 10/07/2017 26  24 - 36 seconds Final  . WBC 10/07/2017 7.4  4.0 - 10.5 K/uL Final  . RBC 10/07/2017 4.77  4.22 - 5.81 MIL/uL Final  . Hemoglobin 10/07/2017 14.4  13.0 - 17.0 g/dL Final  . HCT 10/07/2017 43.7  39.0 - 52.0 % Final  . MCV 10/07/2017 91.6  78.0 - 100.0 fL Final  . MCH 10/07/2017 30.2  26.0 - 34.0 pg Final  . MCHC 10/07/2017 33.0  30.0 - 36.0 g/dL Final  . RDW 10/07/2017 13.6  11.5 - 15.5 % Final  . Platelets 10/07/2017 239  150 - 400 K/uL Final  . Sodium 10/07/2017 139  135 - 145 mmol/L Final  . Potassium 10/07/2017 4.5  3.5 - 5.1 mmol/L Final  . Chloride  10/07/2017 106  101 - 111 mmol/L Final  . CO2 10/07/2017 26  22 - 32 mmol/L Final  . Glucose, Bld 10/07/2017 95  65 - 99 mg/dL Final  . BUN 10/07/2017 24* 6 - 20 mg/dL Final  . Creatinine, Ser 10/07/2017 1.02  0.61 - 1.24 mg/dL Final  . Calcium 10/07/2017 9.4  8.9 - 10.3 mg/dL Final  . Total Protein 10/07/2017 6.7  6.5 - 8.1 g/dL Final  . Albumin 10/07/2017 4.1  3.5 - 5.0 g/dL Final  . AST 10/07/2017 21  15 - 41 U/L Final  . ALT 10/07/2017 20  17 - 63 U/L Final  . Alkaline Phosphatase 10/07/2017 69  38 - 126 U/L Final  . Total Bilirubin 10/07/2017 0.6  0.3 - 1.2 mg/dL Final  . GFR calc non Af Amer 10/07/2017 >60  >60 mL/min Final  . GFR calc Af Amer 10/07/2017 >60  >60 mL/min Final   Comment: (NOTE) The eGFR has been calculated using the CKD EPI equation. This calculation has not been validated in all clinical situations. eGFR's persistently <60 mL/min signify possible Chronic Kidney Disease.   . Anion gap 10/07/2017 7  5 - 15 Final  . Prothrombin Time 10/07/2017 13.5  11.4 - 15.2 seconds Final  . INR 10/07/2017 1.04   Final  . ABO/RH(D) 10/07/2017 A POS   Final  . Antibody Screen 10/07/2017 NEG   Final  . Sample Expiration 10/07/2017 10/14/2017   Final  . Extend sample reason 10/07/2017    Final                   Value:NO TRANSFUSIONS OR PREGNANCY IN THE PAST 3 MONTHS Performed at [facility], [place] [address] [place], [place] [address]   . Hgb A1c MFr Bld 10/07/2017 5.9* 4.8 - 5.6 % Final   Comment: (NOTE) Pre diabetes:          5.7%-6.4% Diabetes:              >6.4% Glycemic control for   <7.0% adults with diabetes   . Mean Plasma Glucose 10/07/2017 122.63  mg/dL Final   Performed at [place] [address], [place], [place] [address]     X-Rays:No results found.  EKG: Orders placed or performed in visit on 01/27/17  . EKG 12-Lead     Hospital Course: Carlos Baldwin is a 83 y.o. who was admitted to [facility]. They were  brought to the operating room on 10/11/2017 and underwent Procedure(s): RIGHT TOTAL KNEE ARTHROPLASTY.  Patient tolerated the procedure well and was later transferred to the recovery room and  then to the orthopaedic floor for postoperative care.  They were given PO and IV analgesics for pain control following their surgery.  They were given 24 hours of postoperative antibiotics of  Anti-infectives (From admission, onward)   Start     Dose/Rate Route Frequency Ordered Stop   10/11/17 2200  ceFAZolin (ANCEF) IVPB 2g/100 mL premix     2 g 200 mL/hr over 30 Minutes Intravenous Every 6 hours 10/11/17 1910 10/12/17 0609   10/11/17 1140  ceFAZolin (ANCEF) IVPB 2g/100 mL premix     2 g 200 mL/hr over 30 Minutes Intravenous On call to O.R. 10/11/17 1140 10/11/17 1615     and started on DVT prophylaxis in the form of Xarelto.   PT and OT were ordered for total joint protocol.  Discharge planning consulted to help with postop disposition and equipment needs.  Patient had a good night on the evening of surgery.  They started to get up OOB with therapy on day one. Hemovac drain was pulled without difficulty.  Continued to work with therapy into day two.  Dressing was changed on day two and the incision was healing well. Patient was seen in rounds on day two and was ready to go home.  Diet - Cardiac diet Follow up - in 2 weeks Activity - WBAT Disposition - Home Condition Upon Discharge - Stable D/C Meds - See DC Summary DVT Prophylaxis - Xarelto      Discharge Instructions    Call MD / Call 911   Complete by:  As directed    If you experience chest pain or shortness of breath, CALL 911 and be transported to the hospital emergency room.  If you develope a fever above 101 F, pus (white drainage) or increased drainage or redness at the wound, or calf pain, call your surgeon's office.   Change dressing   Complete by:  As directed    Change dressing daily with sterile 4 x 4 inch gauze dressing and apply  TED hose. Do not submerge the incision under water.   Constipation Prevention   Complete by:  As directed    Drink plenty of fluids.  Prune juice may be helpful.  You may use a stool softener, such as Colace (over the counter) 100 mg twice a day.  Use MiraLax (over the counter) for constipation as needed.   Diet - low sodium heart healthy   Complete by:  As directed    Discharge instructions   Complete by:  As directed    Take Xarelto for two and a half more weeks, then discontinue Xarelto. Once the patient has completed the blood thinner regimen, then take a Baby 81 mg Aspirin daily for three more weeks.   Pick up stool softner and laxative for home use following surgery while on pain medications. Do not submerge incision under water. Please use good hand washing techniques while changing dressing each day. May shower starting three days after surgery. Please use a clean towel to pat the incision dry following showers. Continue to use ice for pain and swelling after surgery. Do not use any lotions or creams on the incision until instructed by your surgeon.  Wear both TED hose on both legs during the day every day for three weeks, but may remove the TED hose at night at home.  Postoperative Constipation Protocol  Constipation - defined medically as fewer than three stools per week and severe constipation as less than one stool per week.  One of  the most common issues patients have following surgery is constipation.  Even if you have a regular bowel pattern at home, your normal regimen is likely to be disrupted due to multiple reasons following surgery.  Combination of anesthesia, postoperative narcotics, change in appetite and fluid intake all can affect your bowels.  In order to avoid complications following surgery, here are some recommendations in order to help you during your recovery period.  Colace (docusate) - Pick up an over-the-counter form of Colace or another stool softener and  take twice a day as long as you are requiring postoperative pain medications.  Take with a full glass of water daily.  If you experience loose stools or diarrhea, hold the colace until you stool forms back up.  If your symptoms do not get better within 1 week or if they get worse, check with your doctor.  Dulcolax (bisacodyl) - Pick up over-the-counter and take as directed by the product packaging as needed to assist with the movement of your bowels.  Take with a full glass of water.  Use this product as needed if not relieved by Colace only.   MiraLax (polyethylene glycol) - Pick up over-the-counter to have on hand.  MiraLax is a solution that will increase the amount of water in your bowels to assist with bowel movements.  Take as directed and can mix with a glass of water, juice, soda, coffee, or tea.  Take if you go more than two days without a movement. Do not use MiraLax more than once per day. Call your doctor if you are still constipated or irregular after using this medication for 7 days in a row.  If you continue to have problems with postoperative constipation, please contact the office for further assistance and recommendations.  If you experience "the worst abdominal pain ever" or develop nausea or vomiting, please contact the office immediatly for further recommendations for treatment.   Do not put a pillow under the knee. Place it under the heel.   Complete by:  As directed    Do not sit on low chairs, stoools or toilet seats, as it may be difficult to get up from low surfaces   Complete by:  As directed    Driving restrictions   Complete by:  As directed    No driving until released by the physician.   Increase activity slowly as tolerated   Complete by:  As directed    Lifting restrictions   Complete by:  As directed    No lifting until released by the physician.   Patient may shower   Complete by:  As directed    You may shower without a dressing once there is no drainage.  Do  not wash over the wound.  If drainage remains, do not shower until drainage stops.   TED hose   Complete by:  As directed    Use stockings (TED hose) for 3 weeks on both leg(s).  You may remove them at night for sleeping.   Weight bearing as tolerated   Complete by:  As directed      Allergies as of 10/12/2017      Reactions   Atenolol Other (See Comments)   "not sure"   Pravastatin    Joint pain   Simvastatin    Joint pain   Sulfonamide Derivatives Swelling, Rash      Medication List    STOP taking these medications   meloxicam 15 MG tablet Commonly known as:  MOBIC   VITAMIN C PO     TAKE these medications   erythromycin ophthalmic ointment Place 1 application into the right eye at bedtime.   finasteride 5 MG tablet Commonly known as:  PROSCAR take 1 tablet by mouth once daily for prostate What changed:  See the new instructions.   losartan 100 MG tablet Commonly known as:  COZAAR take 1 tablet by mouth once daily What changed:    how much to take  how to take this  when to take this   methocarbamol 500 MG tablet Commonly known as:  ROBAXIN Take 1 tablet (500 mg total) by mouth every 6 (six) hours as needed for muscle spasms.   oxyCODONE 5 MG immediate release tablet Commonly known as:  Oxy IR/ROXICODONE Take 1-2 tablets (5-10 mg total) by mouth every 4 (four) hours as needed for moderate pain or severe pain.   rivaroxaban 10 MG Tabs tablet Commonly known as:  XARELTO Take 1 tablet (10 mg total) by mouth daily with breakfast. Take Xarelto for two and a half more weeks following discharge from the hospital, then discontinue Xarelto. Once the patient has completed the blood thinner regimen, then take a Baby 81 mg Aspirin daily for three more weeks. Start taking on:  10/13/2017   SYSTANE OP Place 1 drop into both eyes 3 (three) times daily as needed (Dry eyes).   tamsulosin 0.4 MG Caps capsule Commonly known as:  FLOMAX take 1 capsule by mouth once daily  for prostate What changed:  See the new instructions.   traMADol 50 MG tablet Commonly known as:  ULTRAM Take 1-2 tablets (50-100 mg total) by mouth every 6 (six) hours as needed (mild pain).            Discharge Care Instructions  (From admission, onward)        Start     Ordered   10/12/17 0000  Weight bearing as tolerated     10/12/17 2104   10/12/17 0000  Change dressing    Comments:  Change dressing daily with sterile 4 x 4 inch gauze dressing and apply TED hose. Do not submerge the incision under water.   10/12/17 2104     Follow-up Information    Gaynelle Arabian, MD. Schedule an appointment as soon as possible for a visit on 10/26/2017.   Specialty:  Orthopedic Surgery Contact information: [address] [place] [address] [phone]           Signed: Arlee Muslim, PA-C Orthopaedic Surgery 10/12/2017, 9:05 PM

## 2017-10-12 NOTE — Progress Notes (Signed)
   10/12/17 1400  PT Visit Information  Last PT Received On 10/12/17  Assistance Needed +1  History of Present Illness s/p R TKA; PMHx: L TKA 10/2014  Subjective Data  Subjective pt is progressing well, hopeful to d/c tomorrow; explained to pt he may have more pain/soreness tomorrow adn no to do to much today  Patient Stated Goal be able to walk a longer distance without pain  Precautions  Precautions Fall;Knee  Precaution Comments IND SLRs, KI not utilized this session  Required Braces or Orthoses Knee Immobilizer - Right  Knee Immobilizer - Right Discontinue once straight leg raise with < 10 degree lag  Restrictions  Weight Bearing Restrictions No  Other Position/Activity Restrictions WBAT  Pain Assessment  Pain Assessment 0-10  Pain Score 4  Pain Location right knee  Pain Descriptors / Indicators Sore  Pain Intervention(s) Limited activity within patient's tolerance;Monitored during session;Premedicated before session  Cognition  Arousal/Alertness Awake/alert  Behavior During Therapy WFL for tasks assessed/performed  Overall Cognitive Status Within Functional Limits for tasks assessed  Bed Mobility  Bed Mobility Sit to Supine;Supine to Sit  Supine to sit Supervision  Sit to supine Supervision  General bed mobility comments for safety  Transfers  Overall transfer level Needs assistance  Equipment used Rolling walker (2 wheeled)  Transfers Sit to/from Stand  Sit to Stand Min guard  General transfer comment min/guard to rise and stabilize  Ambulation/Gait  Ambulation/Gait assistance Min guard;Min assist  Ambulation Distance (Feet) 175 Feet  Assistive device Rolling walker (2 wheeled)  Gait Pattern/deviations Step-to pattern;Step-through pattern  General Gait Details cues for sequence, RW position, step length, cautioned pt not to overdo, he wanted to walk  further  Total Joint Exercises  Ankle Circles/Pumps AROM;Both;10 reps  Heel Slides AAROM;10 reps;Right  Hip  ABduction/ADduction AROM;Right;AAROM;10 reps  PT - End of Session  Equipment Utilized During Treatment Gait belt  Activity Tolerance Patient tolerated treatment well  Patient left Other (comment) (in bathroom, pt requested privacy)  Nurse Communication Mobility status  PT - Assessment/Plan  PT Plan Current plan remains appropriate  PT Frequency (ACUTE ONLY) 7X/week  Follow Up Recommendations DC plan and follow up therapy as arranged by surgeon  PT equipment None recommended by PT  AM-PAC PT "6 Clicks" Daily Activity Outcome Measure  Difficulty turning over in bed (including adjusting bedclothes, sheets and blankets)? 3  Difficulty moving from lying on back to sitting on the side of the bed?  3  Difficulty sitting down on and standing up from a chair with arms (e.g., wheelchair, bedside commode, etc,.)? 3  Help needed moving to and from a bed to chair (including a wheelchair)? 3  Help needed walking in hospital room? 3  Help needed climbing 3-5 steps with a railing?  3  6 Click Score 18  Mobility G Code  CK  PT Goal Progression  Progress towards PT goals Progressing toward goals  Acute Rehab PT Goals  PT Goal Formulation With patient  Potential to Achieve Goals Good  PT Time Calculation  PT Start Time (ACUTE ONLY) 1414  PT Stop Time (ACUTE ONLY) 1431  PT Time Calculation (min) (ACUTE ONLY) 17 min  PT General Charges  $$ ACUTE PT VISIT 1 Visit  PT Treatments  $Gait Training 8-22 mins

## 2017-10-13 LAB — CBC
HCT: 32.6 % — ABNORMAL LOW (ref 39.0–52.0)
Hemoglobin: 11.2 g/dL — ABNORMAL LOW (ref 13.0–17.0)
MCH: 30.8 pg (ref 26.0–34.0)
MCHC: 34.4 g/dL (ref 30.0–36.0)
MCV: 89.6 fL (ref 78.0–100.0)
Platelets: 177 10*3/uL (ref 150–400)
RBC: 3.64 MIL/uL — ABNORMAL LOW (ref 4.22–5.81)
RDW: 13.5 % (ref 11.5–15.5)
WBC: 12.2 10*3/uL — ABNORMAL HIGH (ref 4.0–10.5)

## 2017-10-13 LAB — BASIC METABOLIC PANEL
Anion gap: 5 (ref 5–15)
BUN: 19 mg/dL (ref 6–20)
CO2: 27 mmol/L (ref 22–32)
CREATININE: 0.87 mg/dL (ref 0.61–1.24)
Calcium: 8.3 mg/dL — ABNORMAL LOW (ref 8.9–10.3)
Chloride: 105 mmol/L (ref 101–111)
GFR calc non Af Amer: >60 mL/min (ref >60)
Glucose, Bld: 118 mg/dL — ABNORMAL HIGH (ref 65–99)
Potassium: 3.8 mmol/L (ref 3.5–5.1)
SODIUM: 137 mmol/L (ref 135–145)

## 2017-10-13 NOTE — Progress Notes (Signed)
Physical Therapy Treatment Patient Details Name: Carlos Baldwin MRN: [id number] DOB: 1935/06/06 Today's Date: 10/13/2017    History of Present Illness s/p R TKA; PMHx: L TKA 10/2014    PT Comments    Pt ambulated in hallway and practiced safe stair technique.  Pt also performed LE exercises and provided with HEP handout per his request.  Pt feels ready to d/c home later today and f/u with OP PT on Friday.  Follow Up Recommendations  DC plan and follow up therapy as arranged by surgeon;Outpatient PT     Equipment Recommendations  None recommended by PT    Recommendations for Other Services       Precautions / Restrictions Precautions Precautions: Fall;Knee Precaution Comments: IND SLRs, KI not utilized this session Restrictions Weight Bearing Restrictions: No Other Position/Activity Restrictions: WBAT    Mobility  Bed Mobility Overal bed mobility: Modified Independent                Transfers Overall transfer level: Needs assistance Equipment used: Rolling walker (2 wheeled) Transfers: Sit to/from Stand Sit to Stand: Min guard         General transfer comment: verbal cues for hand placement  Ambulation/Gait Ambulation/Gait assistance: Min guard Ambulation Distance (Feet): 160 Feet Assistive device: Rolling walker (2 wheeled) Gait Pattern/deviations: Step-through pattern;Decreased step length - left     General Gait Details: verbal cues for heel strike, decreased heel strike and increased knee flexion on L LE (pt reports ankle cyst), no unsteadiness   Stairs Stairs: Yes   Stair Management: Step to pattern;Backwards;With walker Number of Stairs: 3 General stair comments: verbal cues for RW positioning, sequence, safety, pt reports understanding  Wheelchair Mobility    Modified Rankin (Stroke Patients Only)       Balance                                            Cognition Arousal/Alertness: Awake/alert Behavior  During Therapy: WFL for tasks assessed/performed Overall Cognitive Status: Within Functional Limits for tasks assessed                                        Exercises Total Joint Exercises Ankle Circles/Pumps: AROM;Both;10 reps Quad Sets: AROM;Both;10 reps Short Arc Quad: AROM;Right;10 reps Heel Slides: AAROM;10 reps;Right;Seated Hip ABduction/ADduction: Right;10 reps;AROM Straight Leg Raises: AROM;Right;10 reps Goniometric ROM: grossly 85* AAROM R knee flexion    General Comments        Pertinent Vitals/Pain Pain Assessment: 0-10 Pain Score: 4  Pain Location: right knee Pain Descriptors / Indicators: Sore Pain Intervention(s): Limited activity within patient's tolerance;Repositioned;Monitored during session    Home Living                      Prior Function            PT Goals (current goals can now be found in the care plan section) Progress towards PT goals: Progressing toward goals    Frequency    7X/week      PT Plan Current plan remains appropriate    Co-evaluation              AM-PAC PT "6 Clicks" Daily Activity  Outcome Measure  Difficulty turning over in bed (including adjusting bedclothes, sheets and  blankets)?: None Difficulty moving from lying on back to sitting on the side of the bed? : None Difficulty sitting down on and standing up from a chair with arms (e.g., wheelchair, bedside commode, etc,.)?: A Little Help needed moving to and from a bed to chair (including a wheelchair)?: A Little Help needed walking in hospital room?: A Little Help needed climbing 3-5 steps with a railing? : A Little 6 Click Score: 20    End of Session Equipment Utilized During Treatment: Gait belt Activity Tolerance: Patient tolerated treatment well Patient left: in chair;with call bell/phone within reach   PT Visit Diagnosis: Difficulty in walking, not elsewhere classified (R26.2)     Time: 9166-0600 PT Time Calculation (min)  (ACUTE ONLY): 25 min  Charges:  $Gait Training: 8-22 mins $Therapeutic Exercise: 8-22 mins                    G Codes:       Carmelia Bake, PT, DPT 10/13/2017 Pager: [phone]  [name] E 10/13/2017, 11:27 AM

## 2017-10-13 NOTE — Progress Notes (Signed)
   Subjective: 2 Days Post-Op Procedure(s) (LRB): RIGHT TOTAL KNEE ARTHROPLASTY (Right) Patient reports pain as mild.   Patient seen in rounds with Dr. Wynelle Link. Patient is well, but has had some minor complaints of pain in the knee, requiring pain medications Patient is ready to go home  Objective: Vital signs in last 24 hours: Temp:  [98.1 F (36.7 C)-98.5 F (36.9 C)] 98.3 F (36.8 C) (02/06 0616) Pulse Rate:  [88-103] 97 (02/06 0616) Resp:  [15-17] 17 (02/06 0616) BP: (141-151)/(62-75) 148/68 (02/06 0616) SpO2:  [94 %-97 %] 96 % (02/06 0616)  Intake/Output from previous day:  Intake/Output Summary (Last 24 hours) at 10/13/2017 0719 Last data filed at 10/13/2017 0616 Gross per 24 hour  Intake 1565 ml  Output 300 ml  Net 1265 ml    Intake/Output this shift: No intake/output data recorded.  Labs: Recent Labs    10/12/17 0617 10/13/17 0607  HGB 12.1* 11.2*   Recent Labs    10/12/17 0617 10/13/17 0607  WBC 12.1* 12.2*  RBC 4.01* 3.64*  HCT 36.5* 32.6*  PLT 200 177   Recent Labs    10/12/17 0617 10/13/17 0607  NA 139 137  K 4.0 3.8  CL 108 105  CO2 24 27  BUN 20 19  CREATININE 0.84 0.87  GLUCOSE 137* 118*  CALCIUM 8.4* 8.3*   No results for input(s): LABPT, INR in the last 72 hours.  EXAM: General - Patient is Alert and Appropriate Extremity - Neurovascular intact Sensation intact distally Incision - clean, dry Motor Function - intact, moving foot and toes well on exam.   Assessment/Plan: 2 Days Post-Op Procedure(s) (LRB): RIGHT TOTAL KNEE ARTHROPLASTY (Right) Procedure(s) (LRB): RIGHT TOTAL KNEE ARTHROPLASTY (Right) Past Medical History:  Diagnosis Date  . Arthritis    oa  . Asthma    as child  . Hyperlipidemia   . Hypertension   . Measles    as child  . Mumps    as child  . Pre-diabetes    Active Problems:   OA (osteoarthritis) of knee  Estimated body mass index is 26.11 kg/m as calculated from the following:   Height as of this  encounter: 5\' 9"  (1.753 m).   Weight as of this encounter: 80.2 kg (176 lb 12.8 oz). Up with therapy Diet - Cardiac diet Follow up - in 2 weeks Activity - WBAT Disposition - Home Condition Upon Discharge - Stable D/C Meds - See DC Summary DVT Prophylaxis - [place], PA-C Orthopaedic Surgery 10/13/2017, 7:19 AM

## 2017-10-13 NOTE — Plan of Care (Signed)
Plan of care reviewed and discussed. °

## 2017-10-15 DIAGNOSIS — M25561 Pain in right knee: Secondary | ICD-10-CM | POA: Diagnosis not present

## 2017-10-18 DIAGNOSIS — M25561 Pain in right knee: Secondary | ICD-10-CM | POA: Diagnosis not present

## 2017-10-20 DIAGNOSIS — M25561 Pain in right knee: Secondary | ICD-10-CM | POA: Diagnosis not present

## 2017-10-22 DIAGNOSIS — M25561 Pain in right knee: Secondary | ICD-10-CM | POA: Diagnosis not present

## 2017-10-25 DIAGNOSIS — M25561 Pain in right knee: Secondary | ICD-10-CM | POA: Diagnosis not present

## 2017-10-27 DIAGNOSIS — M25561 Pain in right knee: Secondary | ICD-10-CM | POA: Diagnosis not present

## 2017-10-29 DIAGNOSIS — M25561 Pain in right knee: Secondary | ICD-10-CM | POA: Diagnosis not present

## 2017-11-01 DIAGNOSIS — M25561 Pain in right knee: Secondary | ICD-10-CM | POA: Diagnosis not present

## 2017-11-03 DIAGNOSIS — M25561 Pain in right knee: Secondary | ICD-10-CM | POA: Diagnosis not present

## 2017-11-05 DIAGNOSIS — M25561 Pain in right knee: Secondary | ICD-10-CM | POA: Diagnosis not present

## 2017-11-08 DIAGNOSIS — M25561 Pain in right knee: Secondary | ICD-10-CM | POA: Diagnosis not present

## 2017-11-10 DIAGNOSIS — M25561 Pain in right knee: Secondary | ICD-10-CM | POA: Diagnosis not present

## 2017-11-12 DIAGNOSIS — M25561 Pain in right knee: Secondary | ICD-10-CM | POA: Diagnosis not present

## 2017-11-15 DIAGNOSIS — Z471 Aftercare following joint replacement surgery: Secondary | ICD-10-CM | POA: Diagnosis not present

## 2017-11-15 DIAGNOSIS — Z96651 Presence of right artificial knee joint: Secondary | ICD-10-CM | POA: Diagnosis not present

## 2017-11-19 ENCOUNTER — Other Ambulatory Visit: Payer: Self-pay | Admitting: Physician Assistant

## 2018-01-21 DIAGNOSIS — H11003 Unspecified pterygium of eye, bilateral: Secondary | ICD-10-CM | POA: Diagnosis not present

## 2018-01-21 DIAGNOSIS — H52213 Irregular astigmatism, bilateral: Secondary | ICD-10-CM | POA: Diagnosis not present

## 2018-02-11 ENCOUNTER — Ambulatory Visit (INDEPENDENT_AMBULATORY_CARE_PROVIDER_SITE_OTHER): Payer: Medicare Other | Admitting: Internal Medicine

## 2018-02-11 ENCOUNTER — Encounter: Payer: Self-pay | Admitting: Internal Medicine

## 2018-02-11 VITALS — BP 132/70 | HR 84 | Temp 97.5°F | Resp 16 | Ht 69.0 in | Wt 179.6 lb

## 2018-02-11 DIAGNOSIS — Z0001 Encounter for general adult medical examination with abnormal findings: Secondary | ICD-10-CM

## 2018-02-11 DIAGNOSIS — Z1211 Encounter for screening for malignant neoplasm of colon: Secondary | ICD-10-CM

## 2018-02-11 DIAGNOSIS — Z79899 Other long term (current) drug therapy: Secondary | ICD-10-CM

## 2018-02-11 DIAGNOSIS — N138 Other obstructive and reflux uropathy: Secondary | ICD-10-CM

## 2018-02-11 DIAGNOSIS — R7309 Other abnormal glucose: Secondary | ICD-10-CM

## 2018-02-11 DIAGNOSIS — Z136 Encounter for screening for cardiovascular disorders: Secondary | ICD-10-CM

## 2018-02-11 DIAGNOSIS — E782 Mixed hyperlipidemia: Secondary | ICD-10-CM

## 2018-02-11 DIAGNOSIS — I10 Essential (primary) hypertension: Secondary | ICD-10-CM

## 2018-02-11 DIAGNOSIS — R7303 Prediabetes: Secondary | ICD-10-CM

## 2018-02-11 DIAGNOSIS — N401 Enlarged prostate with lower urinary tract symptoms: Secondary | ICD-10-CM

## 2018-02-11 DIAGNOSIS — Z125 Encounter for screening for malignant neoplasm of prostate: Secondary | ICD-10-CM

## 2018-02-11 DIAGNOSIS — Z1212 Encounter for screening for malignant neoplasm of rectum: Secondary | ICD-10-CM

## 2018-02-11 DIAGNOSIS — E559 Vitamin D deficiency, unspecified: Secondary | ICD-10-CM

## 2018-02-11 DIAGNOSIS — Z0000 Encounter for general adult medical examination without abnormal findings: Secondary | ICD-10-CM

## 2018-02-11 MED ORDER — PREDNISONE 20 MG PO TABS: ORAL_TABLET | ORAL | 0 refills | Status: DC

## 2018-02-11 NOTE — Patient Instructions (Signed)

## 2018-02-11 NOTE — Progress Notes (Signed)
[facility]   Unk Pinto, M.D.     Uvaldo Bristle. Silverio Lay, P.A.-C Liane Comber, [place]                [address] [place], [place] [address] Telephone [phone] Telefax [phone] Annual  Screening/Preventative Visit  & Comprehensive Evaluation & Examination     This very nice 83 y.o. MWM presents for a Screening/Preventative Visit & comprehensive evaluation and management of multiple medical co-morbidities.  Patient has been followed for HTN, HLD, Prediabetes and Vitamin D Deficiency.     On Oct 11, 2017, patient had a Rt TKR by Dr Wynelle Link and is doing well.      HTN predates circa 2003. Patient's BP has been controlled at home.  Today's BP is at goal -  132/70. Patient denies any cardiac symptoms as chest pain, palpitations, shortness of breath, dizziness or ankle swelling.     Patient's hyperlipidemia is controlled with diet. Last lipids were at goal: Lab Results  Component Value Date   CHOL 148 02/11/2018   HDL 57 02/11/2018   LDLCALC 77 02/11/2018   TRIG 48 02/11/2018   CHOLHDL 2.6 02/11/2018      Patient has prediabetes (A1c 6.1%/2011)  and patient denies reactive hypoglycemic symptoms, visual blurring, diabetic polys or paresthesias. Last A1c was not at goal: Lab Results  Component Value Date   HGBA1C 5.8 (H) 02/11/2018       Finally, patient has history of Vitamin D Deficiency ("35"/2008)  and last vitamin D was at goal: Lab Results  Component Value Date   VD25OH 40 02/11/2018   Current Outpatient Medications on File Prior to Visit  Medication Sig  . aspirin EC 81 MG tablet Take 81 mg by mouth daily.  . finasteride (PROSCAR) 5 MG tablet take 1 tablet by mouth once daily for prostate (Patient taking differently: TAKE 1 TABLET (5 MG) BY MOUTH ONCE DAILY AT 0500)  . losartan (COZAAR) 100 MG tablet take 1 tablet by mouth once daily (Patient taking differently: TAKE 0.5  TABLET (50 MG) BY MOUTH ONCE DAILY AT 0500.)  . Polyethyl Glycol-Propyl Glycol (SYSTANE OP) Place 1 drop into both eyes 3 (three) times daily as needed (Dry eyes).   . tamsulosin (FLOMAX) 0.4 MG CAPS capsule TAKE 1 CAPSULE BY MOUTH ONCE DAILY FOR PROSTATE   No current facility-administered medications on file prior to visit.    Allergies  Allergen Reactions  . Atenolol Other (See Comments)    "not sure"  . Pravastatin     Joint pain  . Simvastatin     Joint pain  . Sulfonamide Derivatives Swelling and Rash   Past Medical History:  Diagnosis Date  . Arthritis    oa  . Asthma    as child  . Hyperlipidemia   . Hypertension   . Measles    as child  . Mumps    as child  . Pre-diabetes    Health Maintenance  Topic Date Due  . INFLUENZA VACCINE  04/07/2018  . TETANUS/TDAP  02/21/2019  . PNA vac Low Risk Adult  Completed   Immunization History  Administered Date(s) Administered  . Pneumococcal Conjugate-13 06/27/2014  . Pneumococcal Polysaccharide-23 09/07/2006  . Zoster 09/07/2008   Last Colon - 03.05.2010 - Dr Fuller Plan - aged out of follow-up  Past Surgical History:  Procedure Laterality Date  .  KNEE SURGERY Right 1972   cartlidge repair  . left knee arthroscopy  01-14-2012  . TOTAL KNEE ARTHROPLASTY Left 11/04/2015   Procedure: LEFT TOTAL KNEE ARTHROPLASTY;  Surgeon: Gaynelle Arabian, MD;  Location: [facility];  Service: Orthopedics;  Laterality: Left;  . TOTAL KNEE ARTHROPLASTY Right 10/11/2017   Procedure: RIGHT TOTAL KNEE ARTHROPLASTY;  Surgeon: Gaynelle Arabian, MD;  Location: [facility];  Service: Orthopedics;  Laterality: Right;   History reviewed. No pertinent family history.   ROS Constitutional: Denies fever, chills, weight loss/gain, headaches, insomnia,  night sweats or change in appetite. Does c/o fatigue. Eyes: Denies redness, blurred vision, diplopia, discharge, itchy or watery eyes.  ENT: Denies discharge, congestion, post nasal drip, epistaxis, sore throat, earache,  hearing loss, dental pain, Tinnitus, Vertigo, Sinus pain or snoring.  Cardio: Denies chest pain, palpitations, irregular heartbeat, syncope, dyspnea, diaphoresis, orthopnea, PND, claudication or edema Respiratory: denies cough, dyspnea, DOE, pleurisy, hoarseness, laryngitis or wheezing.  Gastrointestinal: Denies dysphagia, heartburn, reflux, water brash, pain, cramps, nausea, vomiting, bloating, diarrhea, constipation, hematemesis, melena, hematochezia, jaundice or hemorrhoids Genitourinary: Denies dysuria, frequency, urgency, nocturia, hesitancy, discharge, hematuria or flank pain Musculoskeletal: Denies arthralgia, myalgia, stiffness, Jt. Swelling, pain, limp or strain/sprain. Denies Falls. Skin: Denies puritis, rash, hives, warts, acne, eczema or change in skin lesion Neuro: No weakness, tremor, incoordination, spasms, paresthesia or pain Psychiatric: Denies confusion, memory loss or sensory loss. Denies Depression. Endocrine: Denies change in weight, skin, hair change, nocturia, and paresthesia, diabetic polys, visual blurring or hyper / hypo glycemic episodes.  Heme/Lymph: No excessive bleeding, bruising or enlarged lymph nodes.  Physical Exam  BP 132/70   Pulse 84   Temp (!) 97.5 F (36.4 C)   Resp 16   Ht 5\' 9"  (1.753 m)   Wt 179 lb 9.6 oz (81.5 kg)   BMI 26.52 kg/m   General Appearance: Well nourished and well groomed and in no apparent distress.  Eyes: PERRLA, EOMs, conjunctiva no swelling or erythema, normal fundi and vessels. Sinuses: No frontal/maxillary tenderness ENT/Mouth: EACs patent / TMs  nl. Nares clear without erythema, swelling, mucoid exudates. Oral hygiene is good. No erythema, swelling, or exudate. Tongue normal, non-obstructing. Tonsils not swollen or erythematous. Hearing normal.  Neck: Supple, thyroid not palpable. No bruits, nodes or JVD. Respiratory: Respiratory effort normal.  BS equal and clear bilateral without rales, rhonci, wheezing or stridor. Cardio:  Heart sounds are normal with regular rate and rhythm and no murmurs, rubs or gallops. Peripheral pulses are normal and equal bilaterally without edema. No aortic or femoral bruits. Chest: symmetric with normal excursions and percussion.  Abdomen: Soft, with Nl bowel sounds. Nontender, no guarding, rebound, hernias, masses, or organomegaly.  Lymphatics: Non tender without lymphadenopathy.  Genitourinary: DRE - deferred for age Musculoskeletal: Full ROM all peripheral extremities, joint stability, 5/5 strength, and normal gait. Skin: Warm and dry without rashes, lesions, cyanosis, clubbing or  ecchymosis.  Neuro: Cranial nerves intact, reflexes equal bilaterally. Normal muscle tone, no cerebellar symptoms. Sensation intact.  Pysch: Alert and oriented X 3 with normal affect, insight and judgment appropriate.   Assessment and Plan  1. Annual Preventative/Screening Exam   2. Essential hypertension  - EKG 12-Lead - [place], RETROPERITNL ABD,  LTD - Urinalysis, Routine w reflex microscopic - Microalbumin / creatinine urine ratio - CBC with Differential/Platelet - COMPLETE METABOLIC PANEL WITH GFR - Magnesium - TSH  3. Hyperlipidemia, mixed  - EKG 12-Lead - [place], RETROPERITNL ABD,  LTD - Lipid panel - TSH  4. Abnormal glucose  -  EKG 12-Lead - [place], RETROPERITNL ABD,  LTD - Hemoglobin A1c - Insulin, random  5. Vitamin D deficiency  - VITAMIN D 25 Hydroxy  6. Screening for colorectal cancer  - POC Hemoccult Bld/Stl  7. BPH with obstruction/lower urinary tract symptoms  - PSA  8. Prostate cancer screening  - PSA  9. Screening for ischemic heart disease  - EKG 12-Lead  10. Screening for AAA (aortic abdominal aneurysm)  - [place], RETROPERITNL ABD,  LTD  11. Medication management  - Urinalysis, Routine w reflex microscopic - Microalbumin / creatinine urine ratio - CBC with Differential/Platelet - COMPLETE METABOLIC PANEL WITH GFR - Magnesium - Lipid panel - TSH -  Hemoglobin A1c - Insulin, random - VITAMIN D 25 Hydroxyl       Patient was counseled in prudent diet, weight control to achieve/maintain BMI less than 25, BP monitoring, regular exercise and medications as discussed.  Discussed med effects and SE's. Routine screening labs and tests as requested with regular follow-up as recommended. Over 40 minutes of exam, counseling, chart review and high complex critical decision making was performed

## 2018-02-12 ENCOUNTER — Encounter: Payer: Self-pay | Admitting: Internal Medicine

## 2018-02-14 LAB — CBC WITH DIFFERENTIAL/PLATELET
BASOS ABS: 62 {cells}/uL (ref 0–200)
BASOS PCT: 0.9 %
Eosinophils Absolute: 221 cells/uL (ref 15–500)
Eosinophils Relative: 3.2 %
HEMATOCRIT: 40.2 % (ref 38.5–50.0)
HEMOGLOBIN: 13.6 g/dL (ref 13.2–17.1)
LYMPHS ABS: 1415 {cells}/uL (ref 850–3900)
MCH: 29.6 pg (ref 27.0–33.0)
MCHC: 33.8 g/dL (ref 32.0–36.0)
MCV: 87.6 fL (ref 80.0–100.0)
MONOS PCT: 12.5 %
MPV: 9 fL (ref 7.5–12.5)
NEUTROS ABS: 4340 {cells}/uL (ref 1500–7800)
Neutrophils Relative %: 62.9 %
Platelets: 229 10*3/uL (ref 140–400)
RBC: 4.59 10*6/uL (ref 4.20–5.80)
RDW: 13.1 % (ref 11.0–15.0)
Total Lymphocyte: 20.5 %
WBC: 6.9 10*3/uL (ref 3.8–10.8)
WBCMIX: 863 {cells}/uL (ref 200–950)

## 2018-02-14 LAB — PSA: PSA: 0.2 ng/mL (ref <=4.0)

## 2018-02-14 LAB — COMPLETE METABOLIC PANEL WITH GFR
AG RATIO: 2 (calc) (ref 1.0–2.5)
ALT: 14 U/L (ref 9–46)
AST: 14 U/L (ref 10–35)
Albumin: 4.2 g/dL (ref 3.6–5.1)
Alkaline phosphatase (APISO): 76 U/L (ref 40–115)
BILIRUBIN TOTAL: 0.3 mg/dL (ref 0.2–1.2)
BUN: 23 mg/dL (ref 7–25)
CALCIUM: 9.2 mg/dL (ref 8.6–10.3)
CHLORIDE: 106 mmol/L (ref 98–110)
CO2: 27 mmol/L (ref 20–32)
Creat: 0.91 mg/dL (ref 0.70–1.11)
GFR, EST AFRICAN AMERICAN: 92 mL/min/{1.73_m2} (ref >=60)
GFR, Est Non African American: 79 mL/min/{1.73_m2} (ref >=60)
Globulin: 2.1 g/dL (calc) (ref 1.9–3.7)
Glucose, Bld: 110 mg/dL — ABNORMAL HIGH (ref 65–99)
POTASSIUM: 4.3 mmol/L (ref 3.5–5.3)
Sodium: 140 mmol/L (ref 135–146)
TOTAL PROTEIN: 6.3 g/dL (ref 6.1–8.1)

## 2018-02-14 LAB — URINALYSIS, ROUTINE W REFLEX MICROSCOPIC
Bilirubin Urine: NEGATIVE
GLUCOSE, UA: NEGATIVE
Hgb urine dipstick: NEGATIVE
Ketones, ur: NEGATIVE
LEUKOCYTES UA: NEGATIVE
NITRITE: NEGATIVE
Protein, ur: NEGATIVE
SPECIFIC GRAVITY, URINE: 1.021 (ref 1.001–1.03)

## 2018-02-14 LAB — HEMOGLOBIN A1C
EAG (MMOL/L): 6.6 (calc)
Hgb A1c MFr Bld: 5.8 % of total Hgb — ABNORMAL HIGH (ref <5.7)
MEAN PLASMA GLUCOSE: 120 (calc)

## 2018-02-14 LAB — LIPID PANEL
CHOLESTEROL: 148 mg/dL (ref <200)
HDL: 57 mg/dL (ref >40)
LDL Cholesterol (Calc): 77 mg/dL (calc)
Non-HDL Cholesterol (Calc): 91 mg/dL (calc) (ref <130)
TRIGLYCERIDES: 48 mg/dL (ref <150)
Total CHOL/HDL Ratio: 2.6 (calc) (ref <5.0)

## 2018-02-14 LAB — TSH: TSH: 2.17 mIU/L (ref 0.40–4.50)

## 2018-02-14 LAB — VITAMIN D 25 HYDROXY (VIT D DEFICIENCY, FRACTURES): VIT D 25 HYDROXY: 40 ng/mL (ref 30–100)

## 2018-02-14 LAB — MICROALBUMIN / CREATININE URINE RATIO
CREATININE, URINE: 81 mg/dL (ref 20–320)
Microalb Creat Ratio: 23 mcg/mg creat (ref <30)
Microalb, Ur: 1.9 mg/dL

## 2018-02-14 LAB — MAGNESIUM: MAGNESIUM: 1.9 mg/dL (ref 1.5–2.5)

## 2018-02-14 LAB — INSULIN, RANDOM: Insulin: 7.5 u[IU]/mL (ref 2.0–19.6)

## 2018-02-16 DIAGNOSIS — H11052 Peripheral pterygium, progressive, left eye: Secondary | ICD-10-CM | POA: Diagnosis not present

## 2018-02-16 DIAGNOSIS — H11002 Unspecified pterygium of left eye: Secondary | ICD-10-CM | POA: Diagnosis not present

## 2018-03-02 ENCOUNTER — Other Ambulatory Visit: Payer: Self-pay

## 2018-03-02 DIAGNOSIS — Z1212 Encounter for screening for malignant neoplasm of rectum: Principal | ICD-10-CM

## 2018-03-02 DIAGNOSIS — Z1211 Encounter for screening for malignant neoplasm of colon: Secondary | ICD-10-CM

## 2018-03-02 LAB — POC HEMOCCULT BLD/STL (HOME/3-CARD/SCREEN)
FECAL OCCULT BLD: NEGATIVE
FECAL OCCULT BLD: NEGATIVE
FECAL OCCULT BLD: NEGATIVE

## 2018-03-31 ENCOUNTER — Other Ambulatory Visit: Payer: Self-pay | Admitting: Internal Medicine

## 2018-03-31 DIAGNOSIS — I10 Essential (primary) hypertension: Secondary | ICD-10-CM

## 2018-03-31 MED ORDER — LOSARTAN POTASSIUM 100 MG PO TABS: ORAL_TABLET | ORAL | 1 refills | Status: DC

## 2018-06-23 DIAGNOSIS — Z0110 Encounter for examination of ears and hearing without abnormal findings: Secondary | ICD-10-CM | POA: Diagnosis not present

## 2018-08-15 NOTE — Progress Notes (Addendum)
MEDICARE ANNUAL WELLNESS VISIT AND FOLLOW UP [employer] Assessment:   Encounter for Medicare annual wellness exam 1 year   Essential hypertension - continue medications, DASH diet, exercise and monitor at home. Call if greater than 130/80.   Hyperlipidemia -continue lifestyle management, check lipids at CPE only as wouldn't aggressively pursue secondary to age, decrease fatty foods, increase activity.    Vitamin D deficiency Continue supplement  Prediabetes Discussed general issues about diabetes pathophysiology and management., Educational material distributed., Suggested low cholesterol diet., Encouraged aerobic exercise., Discussed foot care., Reminded to get yearly retinal exam.   Encounter for long-term (current) use of medications - Magnesium   BPH/obstruction Has been on finasteride+ tamsulosin for several years; discussed trial off of tamsulosin, if no change in urinary symptoms, can d/c this medication for now  BMI 26.0-26.9,adult - long discussion about weight loss, diet, and exercise -recommended diet heavy in fruits and veggies and low in animal meats, cheeses, and dairy products   Future Appointments  Date Time Provider [place]  03/08/2019  9:00 AM Unk Pinto, MD [facility] None     Plan:   During the course of the visit the patient was educated and counseled about appropriate screening and preventive services including:    Pneumococcal vaccine   Influenza vaccine  Td vaccine  Screening electrocardiogram  Colorectal cancer screening  Diabetes screening  Glaucoma screening  Nutrition counseling     Subjective:  Carlos Baldwin is a 83 y.o. male who presents for Medicare Annual Wellness Visit and 3 month follow up for HTN, hyperlipidemia, prediabetes, and vitamin D Def.   He is on proscar and flomax for BMP which helps.    Has history of asthma but has not had an issues in years, no albuterol inhaler.  Mobility/QOL mproved since  bilateral knee replacements by Dr. Wynelle Link.   BMI is Body mass index is 25.99 kg/m., he has been working on diet and exercise. Wt Readings from Last 3 Encounters:  08/16/18 176 lb (79.8 kg)  02/11/18 179 lb 9.6 oz (81.5 kg)  10/11/17 176 lb 12.8 oz (80.2 kg)   His blood pressure has been controlled at home, today their BP is BP: 128/72 He does workout, he rides bike at home, has retired from police work.   He denies chest pain, shortness of breath, dizziness.   He is not on cholesterol medication and denies myalgias. His cholesterol is at goal. The cholesterol last visit was:   Lab Results  Component Value Date   CHOL 148 02/11/2018   HDL 57 02/11/2018   LDLCALC 77 02/11/2018   TRIG 48 02/11/2018   CHOLHDL 2.6 02/11/2018   He has been working on diet and exercise for prediabetes, and denies paresthesia of the feet, polydipsia and polyuria. Last A1C in the office was:   Lab Results  Component Value Date   HGBA1C 5.8 (H) 02/11/2018   Patient is on Vitamin D supplement.   Lab Results  Component Value Date   VD25OH 40 02/11/2018   Lab Results  Component Value Date   GFRNONAA 79 02/11/2018     Medication Review: Current Outpatient Medications on File Prior to Visit  Medication Sig Dispense Refill  . aspirin EC 81 MG tablet Take 81 mg by mouth daily.    . finasteride (PROSCAR) 5 MG tablet take 1 tablet by mouth once daily for prostate (Patient taking differently: TAKE 1 TABLET (5 MG) BY MOUTH ONCE DAILY AT 0500) 90 tablet 3  . losartan (COZAAR) 100  MG tablet Take 1/2 to 1 tablet daily as directed for BP 90 tablet 1  . Polyethyl Glycol-Propyl Glycol (SYSTANE OP) Place 1 drop into both eyes 3 (three) times daily as needed (Dry eyes).     . tamsulosin (FLOMAX) 0.4 MG CAPS capsule TAKE 1 CAPSULE BY MOUTH ONCE DAILY FOR PROSTATE 90 capsule 3   No current facility-administered medications on file prior to visit.    Review of Systems  Constitutional: Negative for chills,  diaphoresis, fever, malaise/fatigue and weight loss.  HENT: Negative for ear discharge, ear pain, hearing loss, nosebleeds and tinnitus.   Eyes: Negative.   Respiratory: Negative for cough, hemoptysis, sputum production, shortness of breath, wheezing and stridor.   Cardiovascular: Negative.   Gastrointestinal: Negative.   Genitourinary: Negative.   Musculoskeletal: Negative for back pain, falls, joint pain, myalgias and neck pain.  Skin: Negative.   Neurological: Negative.  Negative for weakness and headaches.  Psychiatric/Behavioral: Negative.  Negative for depression, memory loss and substance abuse. The patient is not nervous/anxious and does not have insomnia.     Current Problems (verified) Patient Active Problem List   Diagnosis Date Noted  . Encounter for Medicare annual wellness exam 07/28/2016  . Prediabetes 12/21/2013  . Medication management 12/21/2013  . Essential hypertension 08/02/2013  . Hyperlipidemia 08/02/2013  . Vitamin D deficiency 08/02/2013  . Bladder neck obstruction 08/02/2013    Screening Tests Immunization History  Administered Date(s) Administered  . Influenza, High Dose Seasonal PF 06/03/2018  . Pneumococcal Conjugate-13 06/27/2014  . Pneumococcal Polysaccharide-23 09/07/2006  . Zoster 09/07/2008   Preventative care: Last colonoscopy: 2010 [place] AB 12/2011 CXR 2002  Prior vaccinations: TD or Tdap: 2010  Influenza: 2019 Pneumococcal: 2008 Prevnar: 2015 Shingles/Zostavax: 2010  Names of Other Physician/Practitioners you currently use: 1. [facility] here for primary care Dr. Ellie Lunch- vision, last visit 2019 Dr. Sabas Sous, last visit 2019, goes q1m  Patient Care Team: Unk Pinto, MD as PCP - General (Internal Medicine) Garald Balding, MD as Consulting Physician (Orthopedic Surgery) Ladene Artist, MD as Consulting Physician (Gastroenterology) Posey Pronto, DDS as Consulting Physician  (Dentistry) Luberta Mutter, MD as Consulting Physician (Ophthalmology)   Allergies Allergies  Allergen Reactions  . Atenolol Other (See Comments)    "not sure"  . Pravastatin     Joint pain  . Simvastatin     Joint pain  . Sulfonamide Derivatives Swelling and Rash    SURGICAL HISTORY He  has a past surgical history that includes Knee surgery (Right, 1972); left knee arthroscopy (01-14-2012); Total knee arthroplasty (Left, 11/04/2015); and Total knee arthroplasty (Right, 10/11/2017). FAMILY HISTORY His family history is not on file. - mother/father passed in 90's no history, siblings healthy SOCIAL HISTORY He  reports that he has never smoked. He has never used smokeless tobacco. He reports that he does not drink alcohol or use drugs.  MEDICARE WELLNESS OBJECTIVES: Physical activity: Current Exercise Habits: Home exercise routine, Type of exercise: strength training/weights;Other - see comments(bicycle ), Time (Minutes): 20, Frequency (Times/Week): 4, Weekly Exercise (Minutes/Week): 80, Intensity: Mild, Exercise limited by: None identified Cardiac risk factors: Cardiac Risk Factors include: advanced age (>65men, >37 women);hypertension;male gender;dyslipidemia Depression/mood screen:   Depression screen [facility] 2/9 08/16/2018  Decreased Interest 0  Down, Depressed, Hopeless 1  PHQ - 2 Score 1    ADLs:  In your present state of health, do you have any difficulty performing the following activities: 08/16/2018 02/12/2018  Hearing? N N  Comment has hearing  aids -  Vision? N N  Difficulty concentrating or making decisions? N N  Walking or climbing stairs? N N  Dressing or bathing? N N  Doing errands, shopping? N N  Some recent data might be hidden     Cognitive Testing  Alert? Yes  Normal Appearance?Yes  Oriented to person? Yes  Place? Yes   Time? Yes  Recall of three objects?  Yes  Can perform simple calculations? Yes  Displays appropriate judgment?Yes  Can read the correct  time from a watch face?Yes  EOL planning: Does Patient Have a Medical Advance Directive?: Yes Type of Advance Directive: Healthcare Power of Attorney, Living will Does patient want to make changes to medical advance directive?: No - Patient declined Copy of [place] in Chart?: No - copy requested Would patient like information on creating a medical advance directive?: No - Patient declined    Objective:   Blood pressure 128/72, pulse 68, temperature 97.9 F (36.6 C), height 5\' 9"  (1.753 m), weight 176 lb (79.8 kg), SpO2 97 %. Body mass index is 25.99 kg/m.  General appearance: alert, no distress, WD/WN, male HEENT: normocephalic, sclerae anicteric, TMs pearly, nares patent, no discharge or erythema, pharynx normal Oral cavity: MMM, no lesions Neck: supple, no lymphadenopathy, no thyromegaly, no masses Heart: RRR, normal S1, S2, no murmurs Lungs: CTA bilaterally, no wheezes, rhonchi, or rales Abdomen: +bs, soft, non tender, non distended, no masses, no hepatomegaly, no splenomegaly Musculoskeletal: nontender, no swelling, no obvious deformity Extremities: no edema, no cyanosis, no clubbing Pulses: 2+ symmetric, upper and lower extremities, normal cap refill Neurological: alert, oriented x 3, CN2-12 intact, strength normal upper extremities and lower extremities, sensation normal throughout, DTRs 2+ throughout, no cerebellar signs, gait antalgic due to knees Psychiatric: normal affect, behavior normal, pleasant   Medicare Attestation I have personally reviewed: The patient's medical and social history Their use of alcohol, tobacco or illicit drugs Their current medications and supplements The patient's functional ability including ADLs,fall risks, home safety risks, cognitive, and hearing and visual impairment Diet and physical activities Evidence for depression or mood disorders  The patient's weight, height, BMI, and visual acuity have been recorded in the  chart.  I have made referrals, counseling, and provided education to the patient based on review of the above and I have provided the patient with a written personalized care plan for preventive services.     Izora Ribas, NP   08/16/2018

## 2018-08-16 ENCOUNTER — Ambulatory Visit (INDEPENDENT_AMBULATORY_CARE_PROVIDER_SITE_OTHER): Payer: Medicare Other | Admitting: Adult Health

## 2018-08-16 ENCOUNTER — Encounter: Payer: Self-pay | Admitting: Adult Health

## 2018-08-16 VITALS — BP 128/72 | HR 68 | Temp 97.9°F | Ht 69.0 in | Wt 176.0 lb

## 2018-08-16 DIAGNOSIS — E559 Vitamin D deficiency, unspecified: Secondary | ICD-10-CM

## 2018-08-16 DIAGNOSIS — Z0001 Encounter for general adult medical examination with abnormal findings: Secondary | ICD-10-CM | POA: Diagnosis not present

## 2018-08-16 DIAGNOSIS — R6889 Other general symptoms and signs: Secondary | ICD-10-CM | POA: Diagnosis not present

## 2018-08-16 DIAGNOSIS — E782 Mixed hyperlipidemia: Secondary | ICD-10-CM

## 2018-08-16 DIAGNOSIS — N320 Bladder-neck obstruction: Secondary | ICD-10-CM

## 2018-08-16 DIAGNOSIS — R7303 Prediabetes: Secondary | ICD-10-CM | POA: Diagnosis not present

## 2018-08-16 DIAGNOSIS — I10 Essential (primary) hypertension: Secondary | ICD-10-CM

## 2018-08-16 DIAGNOSIS — Z79899 Other long term (current) drug therapy: Secondary | ICD-10-CM

## 2018-08-16 DIAGNOSIS — Z6826 Body mass index (BMI) 26.0-26.9, adult: Secondary | ICD-10-CM

## 2018-08-16 DIAGNOSIS — Z0000 Encounter for general adult medical examination without abnormal findings: Secondary | ICD-10-CM

## 2018-08-16 NOTE — Patient Instructions (Addendum)
  Carlos Baldwin , Thank you for taking time to come for your Medicare Wellness Visit. I appreciate your ongoing commitment to your health goals. Please review the following plan we discussed and let me know if I can assist you in the future.   These are the goals we discussed: Goals    . HEMOGLOBIN A1C < 5.7       This is a list of the screening recommended for you and due dates:  Health Maintenance  Topic Date Due  . Tetanus Vaccine  02/21/2019  . Flu Shot  Completed  . Pneumonia vaccines  Completed   Bring by/mail copies of advanced directives/living will for the chart  Try stopping flomax (tamsulosin) - for a few days to weeks. If you don't notice significant change in urinary symptoms, night time urination, urine stream strength, etc, can stop this medication for now.      Know what a healthy weight is for you (roughly BMI <25) and aim to maintain this  Aim for 7+ servings of fruits and vegetables daily  65-80+ fluid ounces of water or unsweet tea for healthy kidneys  Limit to max 1 drink of alcohol per day; avoid smoking/tobacco  Limit animal fats in diet for cholesterol and heart health - choose grass fed whenever available  Avoid highly processed foods, and foods high in saturated/trans fats  Aim for low stress - take time to unwind and care for your mental health  Aim for 150 min of moderate intensity exercise weekly for heart health, and weights twice weekly for bone health  Aim for 7-9 hours of sleep daily

## 2018-08-17 ENCOUNTER — Other Ambulatory Visit: Payer: Self-pay | Admitting: Adult Health

## 2018-08-17 LAB — CBC WITH DIFFERENTIAL/PLATELET
Basophils Absolute: 50 cells/uL (ref 0–200)
Basophils Relative: 0.6 %
EOS ABS: 109 {cells}/uL (ref 15–500)
Eosinophils Relative: 1.3 %
HEMATOCRIT: 41.9 % (ref 38.5–50.0)
Hemoglobin: 14.3 g/dL (ref 13.2–17.1)
LYMPHS ABS: 1235 {cells}/uL (ref 850–3900)
MCH: 30.8 pg (ref 27.0–33.0)
MCHC: 34.1 g/dL (ref 32.0–36.0)
MCV: 90.1 fL (ref 80.0–100.0)
MPV: 9.2 fL (ref 7.5–12.5)
Monocytes Relative: 7.7 %
NEUTROS PCT: 75.7 %
Neutro Abs: 6359 cells/uL (ref 1500–7800)
Platelets: 240 10*3/uL (ref 140–400)
RBC: 4.65 10*6/uL (ref 4.20–5.80)
RDW: 12.6 % (ref 11.0–15.0)
Total Lymphocyte: 14.7 %
WBC: 8.4 10*3/uL (ref 3.8–10.8)
WBCMIX: 647 {cells}/uL (ref 200–950)

## 2018-08-17 LAB — HEMOGLOBIN A1C
Hgb A1c MFr Bld: 5.8 % of total Hgb — ABNORMAL HIGH (ref <5.7)
Mean Plasma Glucose: 120 (calc)
eAG (mmol/L): 6.6 (calc)

## 2018-08-17 LAB — COMPLETE METABOLIC PANEL WITH GFR
AG Ratio: 2.1 (calc) (ref 1.0–2.5)
ALKALINE PHOSPHATASE (APISO): 75 U/L (ref 40–115)
ALT: 14 U/L (ref 9–46)
AST: 13 U/L (ref 10–35)
Albumin: 4.1 g/dL (ref 3.6–5.1)
BUN: 18 mg/dL (ref 7–25)
CALCIUM: 9.2 mg/dL (ref 8.6–10.3)
CO2: 26 mmol/L (ref 20–32)
CREATININE: 0.86 mg/dL (ref 0.70–1.11)
Chloride: 105 mmol/L (ref 98–110)
GFR, Est African American: 94 mL/min/{1.73_m2} (ref >=60)
GFR, Est Non African American: 81 mL/min/{1.73_m2} (ref >=60)
GLUCOSE: 228 mg/dL — AB (ref 65–99)
Globulin: 2 g/dL (calc) (ref 1.9–3.7)
Potassium: 3.9 mmol/L (ref 3.5–5.3)
SODIUM: 141 mmol/L (ref 135–146)
TOTAL PROTEIN: 6.1 g/dL (ref 6.1–8.1)
Total Bilirubin: 0.5 mg/dL (ref 0.2–1.2)

## 2018-08-17 LAB — MAGNESIUM: Magnesium: 1.8 mg/dL (ref 1.5–2.5)

## 2018-08-17 LAB — TSH: TSH: 1.24 mIU/L (ref 0.40–4.50)

## 2018-08-17 LAB — VITAMIN D 25 HYDROXY (VIT D DEFICIENCY, FRACTURES): VIT D 25 HYDROXY: 30 ng/mL (ref 30–100)

## 2018-09-23 DIAGNOSIS — H52203 Unspecified astigmatism, bilateral: Secondary | ICD-10-CM | POA: Diagnosis not present

## 2018-09-23 DIAGNOSIS — H11003 Unspecified pterygium of eye, bilateral: Secondary | ICD-10-CM | POA: Diagnosis not present

## 2018-09-23 DIAGNOSIS — H2513 Age-related nuclear cataract, bilateral: Secondary | ICD-10-CM | POA: Diagnosis not present

## 2018-09-23 DIAGNOSIS — H35371 Puckering of macula, right eye: Secondary | ICD-10-CM | POA: Diagnosis not present

## 2018-09-25 ENCOUNTER — Other Ambulatory Visit: Payer: Self-pay | Admitting: Internal Medicine

## 2018-09-25 DIAGNOSIS — I10 Essential (primary) hypertension: Secondary | ICD-10-CM

## 2018-10-06 DIAGNOSIS — M170 Bilateral primary osteoarthritis of knee: Secondary | ICD-10-CM | POA: Diagnosis not present

## 2018-10-18 DIAGNOSIS — H0014 Chalazion left upper eyelid: Secondary | ICD-10-CM | POA: Diagnosis not present

## 2018-10-20 ENCOUNTER — Other Ambulatory Visit: Payer: Self-pay | Admitting: Internal Medicine

## 2018-10-24 ENCOUNTER — Other Ambulatory Visit: Payer: Self-pay | Admitting: Internal Medicine

## 2018-11-03 DIAGNOSIS — H25811 Combined forms of age-related cataract, right eye: Secondary | ICD-10-CM | POA: Diagnosis not present

## 2018-11-03 DIAGNOSIS — H5709 Other anomalies of pupillary function: Secondary | ICD-10-CM | POA: Diagnosis not present

## 2018-11-03 DIAGNOSIS — H2511 Age-related nuclear cataract, right eye: Secondary | ICD-10-CM | POA: Diagnosis not present

## 2018-11-24 ENCOUNTER — Encounter: Payer: Self-pay | Admitting: Gastroenterology

## 2018-12-02 DIAGNOSIS — H35352 Cystoid macular degeneration, left eye: Secondary | ICD-10-CM | POA: Diagnosis not present

## 2018-12-30 DIAGNOSIS — H35351 Cystoid macular degeneration, right eye: Secondary | ICD-10-CM | POA: Diagnosis not present

## 2019-01-27 DIAGNOSIS — H35351 Cystoid macular degeneration, right eye: Secondary | ICD-10-CM | POA: Diagnosis not present

## 2019-01-27 DIAGNOSIS — H35371 Puckering of macula, right eye: Secondary | ICD-10-CM | POA: Diagnosis not present

## 2019-02-24 DIAGNOSIS — H35371 Puckering of macula, right eye: Secondary | ICD-10-CM | POA: Diagnosis not present

## 2019-02-24 DIAGNOSIS — H35351 Cystoid macular degeneration, right eye: Secondary | ICD-10-CM | POA: Diagnosis not present

## 2019-03-07 ENCOUNTER — Encounter: Payer: Self-pay | Admitting: Internal Medicine

## 2019-03-07 DIAGNOSIS — N138 Other obstructive and reflux uropathy: Secondary | ICD-10-CM | POA: Insufficient documentation

## 2019-03-07 DIAGNOSIS — R7309 Other abnormal glucose: Secondary | ICD-10-CM | POA: Insufficient documentation

## 2019-03-07 NOTE — Progress Notes (Signed)
[facility]  Unk Pinto, M.D.        Uvaldo Bristle. Silverio Lay, P.A.-C         Liane Com[address], [address]                [place] [address] Telephone [phone] Telefax [phone] Annual  Screening/Preventative Visit  & Comprehensive Evaluation & Examination     This very nice 83 y.o. MWM presents for a Screening /Preventative Visit & comprehensive evaluation and management of multiple medical co-morbidities.  Patient has been followed for HTN, HLD, Prediabetes and Vitamin D Deficiency.     HTN predates since 2003. Patient's BP has been controlled at home.  Today's BP is at goal -  116/64. Patient denies any cardiac symptoms as chest pain, palpitations, shortness of breath, dizziness or ankle swelling.     Patient's hyperlipidemia is controlled with diet and medications. Patient denies myalgias or other medication SE's. Last lipids were at goal: Lab Results  Component Value Date   CHOL 148 02/11/2018   HDL 57 02/11/2018   LDLCALC 77 02/11/2018   TRIG 48 02/11/2018   CHOLHDL 2.6 02/11/2018      Patient has hx/o prediabetes (A1c 6.1% / 2011) and patient denies reactive hypoglycemic symptoms, visual blurring, diabetic polys or paresthesias. Last A1c was not at goal: Lab Results  Component Value Date   HGBA1C 5.8 (H) 08/16/2018       Finally, patient has history of Vitamin D Deficiency ("35" / 2008)  and last vitamin D was still very low: Lab Results  Component Value Date   VD25OH 30 08/16/2018   Current Outpatient Medications on File Prior to Visit  Medication Sig  . finasteride (PROSCAR) 5 MG tablet TAKE 1 TABLET BY MOUTH ONCE DAILY FOR PROSTATE  . losartan (COZAAR) 100 MG tablet TAKE 1/2 TO 1 TABLET BY MOUTH DAILY AS DIRECTED FOR BLOOD PRESSURE  . meloxicam (MOBIC) 15 MG tablet TAKE 1/2 TO 1 TABLET BY MOUTH WITH FOOD DAILY IF NEEDED FOR PAIN AND INFLAMMATION  .  Polyethyl Glycol-Propyl Glycol (SYSTANE OP) Place 1 drop into both eyes 3 (three) times daily as needed (Dry eyes).   Marland Kitchen aspirin EC 81 MG tablet Take 81 mg by mouth daily.  . CHOLECALCIFEROL PO Take 2,000 Units by mouth daily.   No current facility-administered medications on file prior to visit.    Allergies  Allergen Reactions  . Atenolol Other (See Comments)    "not sure"  . Pravastatin     Joint pain  . Simvastatin     Joint pain  . Sulfonamide Derivatives Swelling and Rash   Past Medical History:  Diagnosis Date  . Arthritis    oa  . Asthma    as child  . Hyperlipidemia   . Hypertension   . Measles    as child  . Mumps    as child  . Pre-diabetes    Health Maintenance  Topic Date Due  . TETANUS/TDAP  02/21/2019  . INFLUENZA VACCINE  04/08/2019  . PNA vac Low Risk Adult  Completed   Immunization History  Administered Date(s) Administered  . Influenza, High Dose Seasonal PF 06/03/2018  . Pneumococcal Conjugate-13 06/27/2014  . Pneumococcal Polysaccharide-23 09/07/2006  . Zoster 09/07/2008   Last Colon - 11/09/2008 - Dr Fuller Plan - Recc f/u in 10 years  Past Surgical  History:  Procedure Laterality Date  . KNEE SURGERY Right 1972   cartlidge repair  . left knee arthroscopy  01-14-2012  . TOTAL KNEE ARTHROPLASTY Left 11/04/2015   Procedure: LEFT TOTAL KNEE ARTHROPLASTY;  Surgeon: Gaynelle Arabian, MD;  Location: [facility];  Service: Orthopedics;  Laterality: Left;  . TOTAL KNEE ARTHROPLASTY Right 10/11/2017   Procedure: RIGHT TOTAL KNEE ARTHROPLASTY;  Surgeon: Gaynelle Arabian, MD;  Location: [facility];  Service: Orthopedics;  Laterality: Right;   Social History   Socioeconomic History  . Marital status: Married    Spouse name: Jackelyn Poling  . Number of children: 1 son   Occupational History  . Retired Higher education careers adviser  Tobacco Use  . Smoking status: Never Smoker  . Smokeless tobacco: Never Used  Substance and Sexual Activity  . Alcohol use: No  . Drug use: No  . Sexual activity:  Not on file    ROS Constitutional: Denies fever, chills, weight loss/gain, headaches, insomnia,  night sweats or change in appetite. Does c/o fatigue. Eyes: Denies redness, blurred vision, diplopia, discharge, itchy or watery eyes.  ENT: Denies discharge, congestion, post nasal drip, epistaxis, sore throat, earache, hearing loss, dental pain, Tinnitus, Vertigo, Sinus pain or snoring.  Cardio: Denies chest pain, palpitations, irregular heartbeat, syncope, dyspnea, diaphoresis, orthopnea, PND, claudication or edema Respiratory: denies cough, dyspnea, DOE, pleurisy, hoarseness, laryngitis or wheezing.  Gastrointestinal: Denies dysphagia, heartburn, reflux, water brash, pain, cramps, nausea, vomiting, bloating, diarrhea, constipation, hematemesis, melena, hematochezia, jaundice or hemorrhoids Genitourinary: Denies dysuria, frequency, urgency, nocturia, hesitancy, discharge, hematuria or flank pain Musculoskeletal: Denies arthralgia, myalgia, stiffness, Jt. Swelling, pain, limp or strain/sprain. Denies Falls. Skin: Denies puritis, rash, hives, warts, acne, eczema or change in skin lesion Neuro: No weakness, tremor, incoordination, spasms, paresthesia or pain Psychiatric: Denies confusion, memory loss or sensory loss. Denies Depression. Endocrine: Denies change in weight, skin, hair change, nocturia, and paresthesia, diabetic polys, visual blurring or hyper / hypo glycemic episodes.  Heme/Lymph: No excessive bleeding, bruising or enlarged lymph nodes.  Physical Exam  BP 116/64   Pulse 76   Temp (!) 97.5 F (36.4 C)   Resp 16   Ht 5\' 8"  (1.727 m)   Wt 177 lb 6.4 oz (80.5 kg)   BMI 26.97 kg/m   General Appearance: Well nourished and well groomed and in no apparent distress.  Eyes: PERRLA, EOMs, conjunctiva no swelling or erythema, normal fundi and vessels. Sinuses: No frontal/maxillary tenderness ENT/Mouth: EACs patent / TMs  nl. Nares clear without erythema, swelling, mucoid exudates. Oral  hygiene is good. No erythema, swelling, or exudate. Tongue normal, non-obstructing. Tonsils not swollen or erythematous. Hearing normal.  Neck: Supple, thyroid not palpable. No bruits, nodes or JVD. Respiratory: Respiratory effort normal.  BS equal and clear bilateral without rales, rhonci, wheezing or stridor. Cardio: Heart sounds are normal with regular rate and rhythm and no murmurs, rubs or gallops. Peripheral pulses are normal and equal bilaterally without edema. No aortic or femoral bruits. Chest: symmetric with normal excursions and percussion.  Abdomen: Soft, with Nl bowel sounds. Nontender, no guarding, rebound, hernias, masses, or organomegaly.  Lymphatics: Non tender without lymphadenopathy.  Musculoskeletal: Full ROM all peripheral extremities, joint stability, 5/5 strength, and normal gait. Skin: Warm and dry without rashes, lesions, cyanosis, clubbing or  ecchymosis.  Neuro: Cranial nerves intact, reflexes equal bilaterally. Normal muscle tone, no cerebellar symptoms. Sensation intact.  Pysch: Alert and oriented X 3 with normal affect, insight and judgment appropriate.   Assessment and Plan  1. Annual Preventative/Screening  Exam  2. Essential hypertension  - COMPLETE METABOLIC PANEL WITH GFR - Magnesium - TSH - CBC with Differential/Platelet - EKG 12-Lead - [place], RETROPERITNL ABD,  LTD - Microalbumin / creatinine urine ratio  3. Hyperlipidemia, mixed  - Lipid panel - TSH - EKG 12-Lead - [place], RETROPERITNL ABD,  LTD  4. Abnormal glucose  - Insulin, random - Fructosamine - EKG 12-Lead - [place], RETROPERITNL ABD,  LTD  5. Vitamin D deficiency  - VITAMIN D 25 Hydroxyl  6. Prediabetes  - Insulin, random - Fructosamine  7. Screening for colorectal cancer  - POC Hemoccult Bld/Stl  8. BPH with obstruction/lower urinary tract symptoms  - PSA  9. Prostate cancer screening  - PSA  10. Screening for ischemic heart disease  - CBC with  Differential/Platelet  11. Screening for AAA (aortic abdominal aneurysm)  - [place], RETROPERITNL ABD,  LTD  12. Medication management  - COMPLETE METABOLIC PANEL WITH GFR - Magnesium - Lipid panel - TSH - Insulin, random - Fructosamine - VITAMIN D 25 Hydroxyl - CBC with Differential/Platelet         Patient was counseled in prudent diet, weight control to achieve/maintain BMI less than 25, BP monitoring, regular exercise and medications as discussed.  Discussed med effects and SE's. Routine screening labs and tests as requested with regular follow-up as recommended. Over 40 minutes of exam, counseling, chart review and high complex critical decision making was performed   Kirtland Bouchard, MD

## 2019-03-07 NOTE — Patient Instructions (Signed)

## 2019-03-08 ENCOUNTER — Encounter: Payer: Self-pay | Admitting: Internal Medicine

## 2019-03-08 ENCOUNTER — Other Ambulatory Visit: Payer: Self-pay

## 2019-03-08 ENCOUNTER — Ambulatory Visit (INDEPENDENT_AMBULATORY_CARE_PROVIDER_SITE_OTHER): Payer: Medicare Other | Admitting: Internal Medicine

## 2019-03-08 VITALS — BP 116/64 | HR 76 | Temp 97.5°F | Resp 16 | Ht 68.0 in | Wt 177.4 lb

## 2019-03-08 DIAGNOSIS — Z23 Encounter for immunization: Secondary | ICD-10-CM | POA: Diagnosis not present

## 2019-03-08 DIAGNOSIS — Z0000 Encounter for general adult medical examination without abnormal findings: Secondary | ICD-10-CM | POA: Diagnosis not present

## 2019-03-08 DIAGNOSIS — I10 Essential (primary) hypertension: Secondary | ICD-10-CM | POA: Diagnosis not present

## 2019-03-08 DIAGNOSIS — R7303 Prediabetes: Secondary | ICD-10-CM

## 2019-03-08 DIAGNOSIS — N401 Enlarged prostate with lower urinary tract symptoms: Secondary | ICD-10-CM

## 2019-03-08 DIAGNOSIS — Z125 Encounter for screening for malignant neoplasm of prostate: Secondary | ICD-10-CM

## 2019-03-08 DIAGNOSIS — Z136 Encounter for screening for cardiovascular disorders: Secondary | ICD-10-CM

## 2019-03-08 DIAGNOSIS — Z79899 Other long term (current) drug therapy: Secondary | ICD-10-CM

## 2019-03-08 DIAGNOSIS — E559 Vitamin D deficiency, unspecified: Secondary | ICD-10-CM | POA: Diagnosis not present

## 2019-03-08 DIAGNOSIS — E782 Mixed hyperlipidemia: Secondary | ICD-10-CM | POA: Diagnosis not present

## 2019-03-08 DIAGNOSIS — Z0001 Encounter for general adult medical examination with abnormal findings: Secondary | ICD-10-CM

## 2019-03-08 DIAGNOSIS — N138 Other obstructive and reflux uropathy: Secondary | ICD-10-CM

## 2019-03-08 DIAGNOSIS — R7309 Other abnormal glucose: Secondary | ICD-10-CM

## 2019-03-08 DIAGNOSIS — Z1211 Encounter for screening for malignant neoplasm of colon: Secondary | ICD-10-CM

## 2019-03-08 DIAGNOSIS — Z1212 Encounter for screening for malignant neoplasm of rectum: Secondary | ICD-10-CM

## 2019-03-09 LAB — URINALYSIS, ROUTINE W REFLEX MICROSCOPIC
Bilirubin Urine: NEGATIVE
Glucose, UA: NEGATIVE
Hgb urine dipstick: NEGATIVE
Ketones, ur: NEGATIVE
Leukocytes,Ua: NEGATIVE
Nitrite: NEGATIVE
Protein, ur: NEGATIVE
Specific Gravity, Urine: 1.025 (ref 1.001–1.03)
pH: <=5 (ref 5.0–8.0)

## 2019-03-09 LAB — MICROALBUMIN / CREATININE URINE RATIO
Creatinine, Urine: 176 mg/dL (ref 20–320)
Microalb Creat Ratio: 9 mcg/mg creat (ref <30)
Microalb, Ur: 1.6 mg/dL

## 2019-03-14 LAB — CBC WITH DIFFERENTIAL/PLATELET
Absolute Monocytes: 566 cells/uL (ref 200–950)
Basophils Absolute: 59 cells/uL (ref 0–200)
Basophils Relative: 1 %
Eosinophils Absolute: 171 cells/uL (ref 15–500)
Eosinophils Relative: 2.9 %
HCT: 40 % (ref 38.5–50.0)
Hemoglobin: 13.7 g/dL (ref 13.2–17.1)
Lymphs Abs: 1333 cells/uL (ref 850–3900)
MCH: 30.6 pg (ref 27.0–33.0)
MCHC: 34.3 g/dL (ref 32.0–36.0)
MCV: 89.3 fL (ref 80.0–100.0)
MPV: 9.3 fL (ref 7.5–12.5)
Monocytes Relative: 9.6 %
Neutro Abs: 3770 cells/uL (ref 1500–7800)
Neutrophils Relative %: 63.9 %
Platelets: 212 10*3/uL (ref 140–400)
RBC: 4.48 10*6/uL (ref 4.20–5.80)
RDW: 13.1 % (ref 11.0–15.0)
Total Lymphocyte: 22.6 %
WBC: 5.9 10*3/uL (ref 3.8–10.8)

## 2019-03-14 LAB — VITAMIN D 25 HYDROXY (VIT D DEFICIENCY, FRACTURES): Vit D, 25-Hydroxy: 32 ng/mL (ref 30–100)

## 2019-03-14 LAB — FRUCTOSAMINE: Fructosamine: 222 umol/L (ref 205–285)

## 2019-03-14 LAB — LIPID PANEL
Cholesterol: 154 mg/dL (ref <200)
HDL: 57 mg/dL (ref >=40)
LDL Cholesterol (Calc): 84 mg/dL (calc)
Non-HDL Cholesterol (Calc): 97 mg/dL (calc) (ref <130)
Total CHOL/HDL Ratio: 2.7 (calc) (ref <5.0)
Triglycerides: 47 mg/dL (ref <150)

## 2019-03-14 LAB — COMPLETE METABOLIC PANEL WITH GFR
AG Ratio: 2 (calc) (ref 1.0–2.5)
ALT: 13 U/L (ref 9–46)
AST: 13 U/L (ref 10–35)
Albumin: 4 g/dL (ref 3.6–5.1)
Alkaline phosphatase (APISO): 73 U/L (ref 35–144)
BUN: 24 mg/dL (ref 7–25)
CO2: 22 mmol/L (ref 20–32)
Calcium: 9 mg/dL (ref 8.6–10.3)
Chloride: 109 mmol/L (ref 98–110)
Creat: 0.98 mg/dL (ref 0.70–1.11)
GFR, Est African American: 83 mL/min/{1.73_m2} (ref >=60)
GFR, Est Non African American: 72 mL/min/{1.73_m2} (ref >=60)
Globulin: 2 g/dL (calc) (ref 1.9–3.7)
Glucose, Bld: 102 mg/dL — ABNORMAL HIGH (ref 65–99)
Potassium: 4.1 mmol/L (ref 3.5–5.3)
Sodium: 141 mmol/L (ref 135–146)
Total Bilirubin: 0.3 mg/dL (ref 0.2–1.2)
Total Protein: 6 g/dL — ABNORMAL LOW (ref 6.1–8.1)

## 2019-03-14 LAB — PSA: PSA: 0.3 ng/mL (ref <=4.0)

## 2019-03-14 LAB — INSULIN, RANDOM: Insulin: 7.6 u[IU]/mL

## 2019-03-14 LAB — TSH: TSH: 1.79 mIU/L (ref 0.40–4.50)

## 2019-03-14 LAB — MAGNESIUM: Magnesium: 1.8 mg/dL (ref 1.5–2.5)

## 2019-03-23 ENCOUNTER — Other Ambulatory Visit: Payer: Self-pay

## 2019-03-23 ENCOUNTER — Ambulatory Visit (INDEPENDENT_AMBULATORY_CARE_PROVIDER_SITE_OTHER): Payer: Medicare Other | Admitting: Internal Medicine

## 2019-03-23 VITALS — BP 124/60 | HR 88 | Temp 97.2°F | Resp 16 | Ht 68.0 in | Wt 180.0 lb

## 2019-03-23 DIAGNOSIS — I10 Essential (primary) hypertension: Secondary | ICD-10-CM

## 2019-03-23 DIAGNOSIS — L729 Follicular cyst of the skin and subcutaneous tissue, unspecified: Secondary | ICD-10-CM

## 2019-03-23 DIAGNOSIS — L728 Other follicular cysts of the skin and subcutaneous tissue: Secondary | ICD-10-CM

## 2019-03-23 DIAGNOSIS — Z1211 Encounter for screening for malignant neoplasm of colon: Secondary | ICD-10-CM

## 2019-03-23 LAB — POC HEMOCCULT BLD/STL (HOME/3-CARD/SCREEN)
Card #2 Fecal Occult Blod, POC: NEGATIVE
Card #3 Fecal Occult Blood, POC: NEGATIVE
Fecal Occult Blood, POC: NEGATIVE

## 2019-03-24 DIAGNOSIS — H35371 Puckering of macula, right eye: Secondary | ICD-10-CM | POA: Diagnosis not present

## 2019-03-25 ENCOUNTER — Encounter: Payer: Self-pay | Admitting: Internal Medicine

## 2019-03-25 NOTE — Progress Notes (Signed)
  Subjective:    Patient ID: Carlos Baldwin, male    DOB: 28-Aug-1935, 84 y.o.   MRN: [id number]  HPI     Patient is a nice 84 yo MWM with HTN, HLD, Prediabetes and Vitamin D Deficiency  who presents reporting BP's have been WNL and he denies sx's of HA, dizziness, CP, palpitations, dyspnea or dependent edema. He also has concerns re: a nodule of the dorsal Left long finger that has been increasing in size and tenderness.  Medication Sig  . aspirin EC 81 MG tablet Take 81 mg by mouth daily.  . CHOLECALCIFEROL PO Take 2,000 Units by mouth daily.  . finasteride (PROSCAR) 5 MG tablet TAKE 1 TABLET BY MOUTH ONCE DAILY FOR PROSTATE  . losartan (COZAAR) 100 MG tablet TAKE 1/2 TO 1 TABLET BY MOUTH DAILY AS DIRECTED FOR BLOOD PRESSURE  . meloxicam (MOBIC) 15 MG tablet TAKE 1/2 TO 1 TABLET BY MOUTH WITH FOOD DAILY IF NEEDED FOR PAIN AND INFLAMMATION  . Polyethyl Glycol-Propyl Glycol (SYSTANE OP) Place 1 drop into both eyes 3 (three) times daily as needed (Dry eyes).    Allergies  Allergen Reactions  . Atenolol Other (See Comments)    "not sure"  . Pravastatin     Joint pain  . Simvastatin     Joint pain  . Sulfonamide Derivatives Swelling and Rash   Past Medical History:  Diagnosis Date  . Arthritis    oa  . Asthma    as child  . Hyperlipidemia   . Hypertension   . Measles    as child  . Mumps    as child  . Pre-diabetes    Review of Systems     10 point systems review negative except as above.    Objective:   Physical Exam  BP 124/60   Pulse 88   Temp (!) 97.2 F (36.2 C)   Resp 16   Ht 5\' 8"  (1.727 m)   Wt 180 lb (81.6 kg)   BMI 27.37 kg/m   HEENT - WNL. Neck - supple.  Chest - Clear equal BS. Cor - Nl HS. RRR w/o sig MGR. PP 1(+). No edema. MS- FROM w/o deformities.  Gait Nl. Neuro -  Nl w/o focal abnormalities. Skin - there is an inflamed tender approx 5 mm nodule of the dorsal distal Left long finger.   Procedure (CPT:  03159)     After informed consent  and aseptic prep with alcohol and local anesthesia with 1 ml of Marcaine 0.5%, the cyst was sharply excised with a #10 scalpel and clear mucus was expressed. Then the wound cavity was marsupialized with the Hyfrecator and a sterile dressing was applied. Patient was instructed in post-op wound care.     Assessment & Plan:   1. Essential hypertension  2. Mucous retention cyst of skin  3. Screening for colorectal cancer  - Returned card for Hemoccult were tested and were negative.  - POC Hemoccult Bld/Stl (3-Cd Home Screen)

## 2019-06-07 ENCOUNTER — Encounter: Payer: Self-pay | Admitting: Adult Health

## 2019-06-07 NOTE — Progress Notes (Signed)
MEDICARE ANNUAL WELLNESS VISIT AND FOLLOW UP UHC Assessment:   Encounter for Medicare annual wellness exam 1 year   Essential hypertension - continue medications, DASH diet, exercise and monitor at home. Call if greater than 130/80.   Hyperlipidemia -continue lifestyle management, check lipids at CPE only as wouldn't aggressively pursue secondary to age, decrease fatty foods, increase activity.    Vitamin D deficiency Continue supplement  Prediabetes Discussed general issues about diabetes pathophysiology and management., Educational material distributed., Suggested low cholesterol diet., Encouraged aerobic exercise., Discussed foot care., Reminded to get yearly retinal exam.   Encounter for long-term (current) use of medications - Magnesium   BPH/obstruction Has been on finasteride for several years; doing well since d/c flomax; monitor PSA at CPE  Colon cancer screening Normal colonoscopies; excellent life expectancy; after discussion will pursue cologuard for last check  Colonoscopy- patient declines a colonoscopy even though the risks and benefits were discussed at length. Colon cancer is 3rd most diagnosed cancer and 2nd leading cause of death in both men and women 52 years of age and older. Patient understands the risk of cancer and death with declining the test however they are willing to do cologuard screening instead. They understand that this is not as sensitive or specific as a colonoscopy and they are still recommended to get a colonoscopy. The cologuard will be sent out to their house.   BMI 27 - long discussion about weight loss, diet, and exercise -recommended diet heavy in fruits and veggies and low in animal meats, cheeses, and dairy products  Dry cough Cough- multifactorial-  get on PPI, may need to add H2/refer GI. Get on allergy pill, voice rest, suppress cough with OTC sugar free candy  Follow up 1 month if not resolved   Future Appointments  Date Time  Provider [place]  09/11/2019 10:30 AM Unk Pinto, MD [facility] None  03/20/2020  9:00 AM Unk Pinto, MD [facility] None     Plan:   During the course of the visit the patient was educated and counseled about appropriate screening and preventive services including:    Pneumococcal vaccine   Influenza vaccine  Td vaccine  Screening electrocardiogram  Colorectal cancer screening  Diabetes screening  Glaucoma screening  Nutrition counseling     Subjective:  Carlos Baldwin is a 84 y.o. male who presents for Medicare Annual Wellness Visit and 3 month follow up for HTN, hyperlipidemia, prediabetes, and vitamin D Def.   He is on proscar for BPH which helps. Has tapered off of flomax and continues to do well.  Has remote history of asthma but has not had an issues in years, no albuterol inhaler.  Mobility/QOL mproved since bilateral knee replacements by Dr. Wynelle Link.   He reports over the last 7-8 months having occasional persistent dry cough; he does have allergies not currently on medication. Denies hx of GERD.   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  Wt Readings from Last 3 Encounters:  06/08/19 180 lb (81.6 kg)  03/23/19 180 lb (81.6 kg)  03/08/19 177 lb 6.4 oz (80.5 kg)   His blood pressure has been controlled at home, today their BP is BP: 130/70 He does workout, he rides bike at home, has retired from police work.   He denies chest pain, shortness of breath, dizziness.   He is not on cholesterol medication and denies myalgias. His cholesterol is  at goal. The cholesterol last visit was:   Lab Results  Component Value Date   CHOL 154 03/08/2019   HDL 57 03/08/2019   LDLCALC 84 03/08/2019   TRIG 47 03/08/2019   CHOLHDL 2.7 03/08/2019   He has been working on diet and exercise for prediabetes, and denies paresthesia of the feet, polydipsia and  polyuria. Last A1C in the office was:   Lab Results  Component Value Date   HGBA1C 5.8 (H) 08/16/2018   Patient is on Vitamin D supplement, was off Lab Results  Component Value Date   VD25OH 32 03/08/2019   Lab Results  Component Value Date   GFRNONAA 72 03/08/2019     Medication Review: Current Outpatient Medications on File Prior to Visit  Medication Sig Dispense Refill  . CHOLECALCIFEROL PO Take 2,000 Units by mouth daily.    . finasteride (PROSCAR) 5 MG tablet TAKE 1 TABLET BY MOUTH ONCE DAILY FOR PROSTATE 90 tablet 3  . losartan (COZAAR) 100 MG tablet TAKE 1/2 TO 1 TABLET BY MOUTH DAILY AS DIRECTED FOR BLOOD PRESSURE 90 tablet 3  . meloxicam (MOBIC) 15 MG tablet TAKE 1/2 TO 1 TABLET BY MOUTH WITH FOOD DAILY IF NEEDED FOR PAIN AND INFLAMMATION 90 tablet 3  . Polyethyl Glycol-Propyl Glycol (SYSTANE OP) Place 1 drop into both eyes 3 (three) times daily as needed (Dry eyes).     Marland Kitchen aspirin EC 81 MG tablet Take 81 mg by mouth daily.     No current facility-administered medications on file prior to visit.    Review of Systems  Constitutional: Negative for chills, diaphoresis, fever, malaise/fatigue and weight loss.  HENT: Negative for ear discharge, ear pain, hearing loss, nosebleeds and tinnitus.   Eyes: Negative.   Respiratory: Negative for cough, hemoptysis, sputum production, shortness of breath, wheezing and stridor.   Cardiovascular: Negative.   Gastrointestinal: Negative.   Genitourinary: Negative.   Musculoskeletal: Negative for back pain, falls, joint pain, myalgias and neck pain.  Skin: Negative.   Neurological: Negative.  Negative for weakness and headaches.  Psychiatric/Behavioral: Negative.  Negative for depression, memory loss and substance abuse. The patient is not nervous/anxious and does not have insomnia.     Current Problems (verified) Patient Active Problem List   Diagnosis Date Noted  . BPH with obstruction/lower urinary tract symptoms 03/07/2019  .  Abnormal glucose 03/07/2019  . Prediabetes 12/21/2013  . Medication management 12/21/2013  . Essential hypertension 08/02/2013  . Hyperlipidemia, mixed 08/02/2013  . Vitamin D deficiency 08/02/2013    Screening Tests Immunization History  Administered Date(s) Administered  . Influenza, High Dose Seasonal PF 06/03/2018, 05/25/2019  . Pneumococcal Conjugate-13 06/27/2014  . Pneumococcal Polysaccharide-23 09/07/2006  . Td 03/08/2019  . Zoster 09/07/2008   Preventative care: Last colonoscopy: 2010, no polyps, diverticulosis, due 10 year follow up, had neg hemoccult this year,  Discussed cologuard and will proceed with this [place] AB 12/2011 CXR 2002  Prior vaccinations: TD or Tdap: 2020  Influenza: 2020 Pneumococcal: 2008 Prevnar: 2015 Shingles/Zostavax: 2010  Names of Other Physician/Practitioners you currently use: 1. [facility] here for primary care Dr. Ellie Lunch- vision, last visit 2020 Dr. Sabas Sous, last visit 2020, goes q44m  Patient Care Team: Unk Pinto, MD as PCP - General (Internal Medicine) Garald Balding, MD as Consulting Physician (Orthopedic Surgery) Ladene Artist, MD as Consulting Physician (Gastroenterology) Posey Pronto, DDS as Consulting Physician (Dentistry) Luberta Mutter, MD as Consulting Physician (Ophthalmology)   Allergies Allergies  Allergen  Reactions  . Atenolol Other (See Comments)    "not sure"  . Pravastatin     Joint pain  . Simvastatin     Joint pain  . Sulfonamide Derivatives Swelling and Rash    SURGICAL HISTORY He  has a past surgical history that includes Knee surgery (Right, 1972); left knee arthroscopy (01-14-2012); Total knee arthroplasty (Left, 11/04/2015); and Total knee arthroplasty (Right, 10/11/2017). FAMILY HISTORY His family history is not on file. - mother/father passed in 90's no history, siblings healthy SOCIAL HISTORY He  reports that he has never smoked. He has never  used smokeless tobacco. He reports that he does not drink alcohol or use drugs.  MEDICARE WELLNESS OBJECTIVES: Physical activity:   Cardiac risk factors:   Depression/mood screen:   Depression screen [facility] 2/9 06/08/2019  Decreased Interest 0  Down, Depressed, Hopeless 0  PHQ - 2 Score 0    ADLs:  In your present state of health, do you have any difficulty performing the following activities: 03/07/2019 08/16/2018  Hearing? N N  Comment - has hearing aids  Vision? N N  Difficulty concentrating or making decisions? N N  Walking or climbing stairs? N N  Dressing or bathing? N N  Doing errands, shopping? N N  Some recent data might be hidden     Cognitive Testing  Alert? Yes  Normal Appearance?Yes  Oriented to person? Yes  Place? Yes   Time? Yes  Recall of three objects?  Yes  Can perform simple calculations? Yes  Displays appropriate judgment?Yes  Can read the correct time from a watch face?Yes  EOL planning: Does Patient Have a Medical Advance Directive?: Yes Type of Advance Directive: Healthcare Power of Attorney, Living will Does patient want to make changes to medical advance directive?: No - Patient declined Copy of [place] in Chart?: No - copy requested    Objective:   Blood pressure 130/70, pulse 84, temperature 97.9 F (36.6 C), height 5\' 9"  (1.753 m), weight 180 lb (81.6 kg), SpO2 97 %. Body mass index is 26.58 kg/m.  General appearance: alert, no distress, WD/WN, male HEENT: normocephalic, sclerae anicteric, TMs pearly, nares patent, no discharge or erythema, pharynx normal Oral cavity: MMM, no lesions Neck: supple, no lymphadenopathy, no thyromegaly, no masses Heart: RRR, normal S1, S2, no murmurs Lungs: CTA bilaterally, no wheezes, rhonchi, or rales Abdomen: +bs, soft, non tender, non distended, no masses, no hepatomegaly, no splenomegaly Musculoskeletal: nontender, no swelling, no obvious deformity Extremities: no edema, no cyanosis,  no clubbing Pulses: 2+ symmetric, upper and lower extremities, normal cap refill Neurological: alert, oriented x 3, CN2-12 intact, strength normal upper extremities and lower extremities, sensation normal throughout, DTRs 2+ throughout, no cerebellar signs, gait antalgic due to knees Psychiatric: normal affect, behavior normal, pleasant   Medicare Attestation I have personally reviewed: The patient's medical and social history Their use of alcohol, tobacco or illicit drugs Their current medications and supplements The patient's functional ability including ADLs,fall risks, home safety risks, cognitive, and hearing and visual impairment Diet and physical activities Evidence for depression or mood disorders  The patient's weight, height, BMI, and visual acuity have been recorded in the chart.  I have made referrals, counseling, and provided education to the patient based on review of the above and I have provided the patient with a written personalized care plan for preventive services.     Izora Ribas, NP   06/08/2019

## 2019-06-08 ENCOUNTER — Other Ambulatory Visit: Payer: Self-pay

## 2019-06-08 ENCOUNTER — Encounter: Payer: Self-pay | Admitting: Adult Health

## 2019-06-08 ENCOUNTER — Ambulatory Visit (INDEPENDENT_AMBULATORY_CARE_PROVIDER_SITE_OTHER): Payer: Medicare Other | Admitting: Adult Health

## 2019-06-08 VITALS — BP 130/70 | HR 84 | Temp 97.9°F | Ht 69.0 in | Wt 180.0 lb

## 2019-06-08 DIAGNOSIS — I10 Essential (primary) hypertension: Secondary | ICD-10-CM | POA: Diagnosis not present

## 2019-06-08 DIAGNOSIS — E782 Mixed hyperlipidemia: Secondary | ICD-10-CM | POA: Diagnosis not present

## 2019-06-08 DIAGNOSIS — R7303 Prediabetes: Secondary | ICD-10-CM | POA: Diagnosis not present

## 2019-06-08 DIAGNOSIS — Z79899 Other long term (current) drug therapy: Secondary | ICD-10-CM | POA: Diagnosis not present

## 2019-06-08 DIAGNOSIS — Z1211 Encounter for screening for malignant neoplasm of colon: Secondary | ICD-10-CM

## 2019-06-08 DIAGNOSIS — Z6827 Body mass index (BMI) 27.0-27.9, adult: Secondary | ICD-10-CM

## 2019-06-08 DIAGNOSIS — N401 Enlarged prostate with lower urinary tract symptoms: Secondary | ICD-10-CM | POA: Diagnosis not present

## 2019-06-08 DIAGNOSIS — N138 Other obstructive and reflux uropathy: Secondary | ICD-10-CM

## 2019-06-08 DIAGNOSIS — R053 Chronic cough: Secondary | ICD-10-CM

## 2019-06-08 DIAGNOSIS — E559 Vitamin D deficiency, unspecified: Secondary | ICD-10-CM

## 2019-06-08 DIAGNOSIS — R7309 Other abnormal glucose: Secondary | ICD-10-CM | POA: Diagnosis not present

## 2019-06-08 DIAGNOSIS — Z0000 Encounter for general adult medical examination without abnormal findings: Secondary | ICD-10-CM

## 2019-06-08 DIAGNOSIS — Z0001 Encounter for general adult medical examination with abnormal findings: Secondary | ICD-10-CM | POA: Diagnosis not present

## 2019-06-08 DIAGNOSIS — R05 Cough: Secondary | ICD-10-CM

## 2019-06-08 DIAGNOSIS — R6889 Other general symptoms and signs: Secondary | ICD-10-CM

## 2019-06-08 NOTE — Patient Instructions (Addendum)
  Mr. Carlos Baldwin , Thank you for taking time to come for your Medicare Wellness Visit. I appreciate your ongoing commitment to your health goals. Please review the following plan we discussed and let me know if I can assist you in the future.   These are the goals we discussed: Goals    . HEMOGLOBIN A1C < 5.7       This is a list of the screening recommended for you and due dates:  Health Maintenance  Topic Date Due  . Flu Shot  04/08/2019  . Tetanus Vaccine  03/07/2029  . Pneumonia vaccines  Completed    Please bring your power of attorney and living will documents for [place] to scan    Recommend adding a daily allergy medication x 2 weeks   Try adding OTC nexium 1 tab daily in the evening x 2 week    Common causes of cough OR hoarseness OR sore throat:   Allergies, Viral Infections, Acid Reflux and Bacterial Infections.   Allergies and viral infections cause a cough OR sore throat by post nasal drip and are often worse at night, can also have sneezing, lower grade fevers, clear/yellow mucus. This is best treated with allergy medications or nasal sprays.  Please get on allegra for 1-2 weeks The strongest is allegra or fexafinadine  Cheapest at [employer], [employer], [employer]   Bacterial infections are more severe than allergies or viral infections with fever, teeth pain, fatigue. This can be treated with prednisone and the same over the counter medication and after 7 days can be treated with an antibiotic.   Silent reflux/GERD can cause a cough OR sore throat OR hoarseness WITHOUT heart burn because the esophagus that goes to the stomach and trachea that goes to the lungs are very close and when you lay down the acid can irritate your throat and lungs. This can cause hoarseness, cough, and wheezing. Please stop any alcohol or anti-inflammatories like aleve/advil/ibuprofen and start an over the counter Prilosec or omeprazole 1-2 times daily 58mins before food for 2 weeks, then switch to over  the counter zantac/ratinidine or pepcid/famotadine once at night for 2 weeks.    sometimes irritation causes more irritation. Try voice rest, use sugar free cough drops to prevent coughing, and try to stop clearing your throat.   If you ever have a cough that does not go away after trying these things please make a follow up visit for further evaluation or we can refer you to a specialist. Or if you ever have shortness of breath or chest pain go to the ER.

## 2019-06-09 LAB — MAGNESIUM: Magnesium: 1.9 mg/dL (ref 1.5–2.5)

## 2019-06-09 LAB — CBC WITH DIFFERENTIAL/PLATELET
Absolute Monocytes: 685 cells/uL (ref 200–950)
Basophils Absolute: 51 cells/uL (ref 0–200)
Basophils Relative: 0.8 %
Eosinophils Absolute: 128 cells/uL (ref 15–500)
Eosinophils Relative: 2 %
HCT: 39.8 % (ref 38.5–50.0)
Hemoglobin: 13.4 g/dL (ref 13.2–17.1)
Lymphs Abs: 1248 cells/uL (ref 850–3900)
MCH: 30.7 pg (ref 27.0–33.0)
MCHC: 33.7 g/dL (ref 32.0–36.0)
MCV: 91.3 fL (ref 80.0–100.0)
MPV: 9.2 fL (ref 7.5–12.5)
Monocytes Relative: 10.7 %
Neutro Abs: 4288 cells/uL (ref 1500–7800)
Neutrophils Relative %: 67 %
Platelets: 230 10*3/uL (ref 140–400)
RBC: 4.36 10*6/uL (ref 4.20–5.80)
RDW: 13.1 % (ref 11.0–15.0)
Total Lymphocyte: 19.5 %
WBC: 6.4 10*3/uL (ref 3.8–10.8)

## 2019-06-09 LAB — COMPLETE METABOLIC PANEL WITH GFR
AG Ratio: 2 (calc) (ref 1.0–2.5)
ALT: 17 U/L (ref 9–46)
AST: 16 U/L (ref 10–35)
Albumin: 4 g/dL (ref 3.6–5.1)
Alkaline phosphatase (APISO): 66 U/L (ref 35–144)
BUN: 21 mg/dL (ref 7–25)
CO2: 24 mmol/L (ref 20–32)
Calcium: 9.3 mg/dL (ref 8.6–10.3)
Chloride: 106 mmol/L (ref 98–110)
Creat: 0.89 mg/dL (ref 0.70–1.11)
GFR, Est African American: 93 mL/min/{1.73_m2} (ref >=60)
GFR, Est Non African American: 80 mL/min/{1.73_m2} (ref >=60)
Globulin: 2 g/dL (calc) (ref 1.9–3.7)
Glucose, Bld: 135 mg/dL — ABNORMAL HIGH (ref 65–99)
Potassium: 4 mmol/L (ref 3.5–5.3)
Sodium: 140 mmol/L (ref 135–146)
Total Bilirubin: 0.5 mg/dL (ref 0.2–1.2)
Total Protein: 6 g/dL — ABNORMAL LOW (ref 6.1–8.1)

## 2019-06-09 LAB — HEMOGLOBIN A1C
Hgb A1c MFr Bld: 5.9 % of total Hgb — ABNORMAL HIGH (ref <5.7)
Mean Plasma Glucose: 123 (calc)
eAG (mmol/L): 6.8 (calc)

## 2019-06-09 LAB — TSH: TSH: 1.45 mIU/L (ref 0.40–4.50)

## 2019-06-28 DIAGNOSIS — Z1212 Encounter for screening for malignant neoplasm of rectum: Secondary | ICD-10-CM | POA: Diagnosis not present

## 2019-06-28 DIAGNOSIS — Z1211 Encounter for screening for malignant neoplasm of colon: Secondary | ICD-10-CM | POA: Diagnosis not present

## 2019-07-05 ENCOUNTER — Encounter: Payer: Self-pay | Admitting: Internal Medicine

## 2019-07-05 ENCOUNTER — Other Ambulatory Visit: Payer: Self-pay | Admitting: Adult Health

## 2019-07-05 DIAGNOSIS — R195 Other fecal abnormalities: Secondary | ICD-10-CM

## 2019-07-05 LAB — COLOGUARD: Cologuard: POSITIVE — AB

## 2019-07-15 NOTE — Progress Notes (Signed)
07/15/2019 Carlos Baldwin [id number] 31-Dec-1934   HISTORY OF PRESENT ILLNESS: Mr. Carlos Baldwin is an 84 year old male with a past medical history of hypertension, hyperlipidemia, osteoarthritis and BPH. He stated he is no longer considered prediabetic. He recently underwent a Cologuard Test which was positive. He presents today to schedule a colonoscopy. He is concerned that he ate red meat prior to completing the Cologuard test, he is worried the test was a false positive. I explained to the patient that the Cologuard test does not check for blood but checks for precancerous and cancerous cells from the colon. I explained a positive Cologuard test does not give him a diagnosis of colon cancer but a colonoscopy is recommended to further assess for precancerous polyps and colorectal cancer. He underwent a colonoscopy by Dr. Fuller Plan on 11/09/2008 which identified diverticulosis to the descending and sigmoid colon, no polyps. A repeat colonoscopy in 10 years was advised. He possible had a colonoscopy in his 6's which he reported was normal. No family history of colon polyps or colorectal cancer. Both of his parents lived into their mid 60's, died from "old age". He denies having any upper or lower abdominal pain. He is passing a normal formed bowel movement once daily. No rectal bleeding or melena. No dysphagia, heartburn or stomach pain. He stated he is not taking ASA 81mg  as reported on his past medication lists. He takes Meloxicam 15mg  1/2 tab once daily for knee pain and arthritis, approximately 15 does in a month. He has seasonal allergies with an intermittent dry cough which he is not concerned about. No chest pain or shortness of breath.  Nonsmoker. No alcohol use.  Labs 06/08/2019 Hg 13.4. HCT 39.8.  Past Medical History:  Diagnosis Date  . Arthritis    oa  . Asthma    as child  . Bladder neck obstruction 08/02/2013  . Hyperlipidemia   . Hypertension   . Measles    as child  .  Mumps    as child  . Pre-diabetes    Past Surgical History:  Procedure Laterality Date  . KNEE SURGERY Right 1972   cartlidge repair  . left knee arthroscopy  01-14-2012  . TOTAL KNEE ARTHROPLASTY Left 11/04/2015   Procedure: LEFT TOTAL KNEE ARTHROPLASTY;  Surgeon: Gaynelle Arabian, MD;  Location: [facility];  Service: Orthopedics;  Laterality: Left;  . TOTAL KNEE ARTHROPLASTY Right 10/11/2017   Procedure: RIGHT TOTAL KNEE ARTHROPLASTY;  Surgeon: Gaynelle Arabian, MD;  Location: [facility];  Service: Orthopedics;  Laterality: Right;    reports that he has never smoked. He has never used smokeless tobacco. He reports that he does not drink alcohol or use drugs. family history is not on file. Allergies  Allergen Reactions  . Atenolol Other (See Comments)    "not sure"  . Pravastatin     Joint pain  . Simvastatin     Joint pain  . Sulfonamide Derivatives Swelling and Rash      Outpatient Encounter Medications as of 07/17/2019  Medication Sig  . aspirin EC 81 MG tablet Take 81 mg by mouth daily.  . CHOLECALCIFEROL PO Take 2,000 Units by mouth daily.  . finasteride (PROSCAR) 5 MG tablet TAKE 1 TABLET BY MOUTH ONCE DAILY FOR PROSTATE  . losartan (COZAAR) 100 MG tablet TAKE 1/2 TO 1 TABLET BY MOUTH DAILY AS DIRECTED FOR BLOOD PRESSURE  . meloxicam (MOBIC) 15 MG tablet TAKE 1/2 TO 1 TABLET BY MOUTH WITH FOOD DAILY IF  NEEDED FOR PAIN AND INFLAMMATION  . Polyethyl Glycol-Propyl Glycol (SYSTANE OP) Place 1 drop into both eyes 3 (three) times daily as needed (Dry eyes).    No facility-administered encounter medications on file as of 07/17/2019.    REVIEW OF SYSTEMS  : All other systems reviewed and negative except where noted in the History of Present Illness.   PHYSICAL EXAM: There were no vitals taken for this visit. General: Well developed white male in no acute distress Head: Normocephalic and atraumatic Eyes:  Sclerae anicteric,conjunctive pink. Mouth: Dentition intact, no ulcers or lesions.   Ears: Normal auditory acuity Neck: Supple, no masses.  Lungs: Clear throughout to auscultation Heart: Regular rate and rhythm Abdomen: Soft, nontender, non distended. No masses or hepatomegaly noted. Normal bowel sounds Rectal: Deferred. Musculoskeletal: Symmetrical with no gross deformities  Skin: No lesions on visible extremities Extremities: No edema  Neurological: Alert oriented x 4, no focal deficits.  Cervical Nodes:  No significant cervical adenopathy Psychological:  Alert and cooperative. Normal mood and affect  ASSESSMENT AND PLAN:  38. 84 year old male with a Positive Cologuard test presents today to schedule a colonoscopy  -Colonoscopy benefits and risks discussed including risk with sedation, risk of perforation, bleeding and infection -Further follow up to be determined after colonoscopy completed.  2. Seasonal allergies with an intermittent nonproductive cough     CC:  Unk Pinto, MD

## 2019-07-17 ENCOUNTER — Encounter: Payer: Self-pay | Admitting: Nurse Practitioner

## 2019-07-17 ENCOUNTER — Other Ambulatory Visit: Payer: Self-pay

## 2019-07-17 ENCOUNTER — Ambulatory Visit: Payer: Medicare Other | Admitting: Nurse Practitioner

## 2019-07-17 VITALS — BP 119/70 | HR 72 | Temp 98.0°F | Ht 68.0 in | Wt 178.0 lb

## 2019-07-17 DIAGNOSIS — R195 Other fecal abnormalities: Secondary | ICD-10-CM | POA: Diagnosis not present

## 2019-07-17 DIAGNOSIS — Z1159 Encounter for screening for other viral diseases: Secondary | ICD-10-CM | POA: Diagnosis not present

## 2019-07-17 HISTORY — DX: Other fecal abnormalities: R19.5

## 2019-07-17 MED ORDER — NA SULFATE-K SULFATE-MG SULF 17.5-3.13-1.6 GM/177ML PO SOLN: 1.0000 | Freq: Once | ORAL | 0 refills | Status: AC

## 2019-07-17 NOTE — Progress Notes (Signed)
Reviewed and agree with management plan.  Malcolm T. Stark, MD FACG [facility]  

## 2019-07-17 NOTE — Patient Instructions (Signed)
You have been scheduled for a colonoscopy. Please follow written instructions given to you at your visit today.  Please pick up your prep supplies at the pharmacy within the next 1-3 days. If you use inhalers (even only as needed), please bring them with you on the day of your procedure.   

## 2019-07-24 ENCOUNTER — Encounter: Payer: Self-pay | Admitting: Gastroenterology

## 2019-08-02 ENCOUNTER — Ambulatory Visit (INDEPENDENT_AMBULATORY_CARE_PROVIDER_SITE_OTHER): Payer: Medicare Other

## 2019-08-02 ENCOUNTER — Other Ambulatory Visit: Payer: Self-pay | Admitting: Gastroenterology

## 2019-08-02 DIAGNOSIS — Z1159 Encounter for screening for other viral diseases: Secondary | ICD-10-CM

## 2019-08-04 LAB — SARS CORONAVIRUS 2 (TAT 6-24 HRS): SARS Coronavirus 2: NEGATIVE

## 2019-08-07 ENCOUNTER — Encounter: Payer: Medicare Other | Admitting: Gastroenterology

## 2019-08-18 ENCOUNTER — Other Ambulatory Visit: Payer: Self-pay

## 2019-08-18 ENCOUNTER — Ambulatory Visit (AMBULATORY_SURGERY_CENTER): Payer: Medicare Other | Admitting: Gastroenterology

## 2019-08-18 ENCOUNTER — Encounter: Payer: Self-pay | Admitting: Gastroenterology

## 2019-08-18 VITALS — BP 155/88 | HR 79 | Temp 97.5°F | Resp 16 | Ht 68.0 in | Wt 178.0 lb

## 2019-08-18 DIAGNOSIS — R195 Other fecal abnormalities: Secondary | ICD-10-CM

## 2019-08-18 DIAGNOSIS — D123 Benign neoplasm of transverse colon: Secondary | ICD-10-CM | POA: Diagnosis not present

## 2019-08-18 DIAGNOSIS — D128 Benign neoplasm of rectum: Secondary | ICD-10-CM | POA: Diagnosis not present

## 2019-08-18 MED ORDER — SODIUM CHLORIDE 0.9 % IV SOLN: 500.0000 mL | Freq: Once | INTRAVENOUS | Status: DC

## 2019-08-18 NOTE — Progress Notes (Signed)
Report to PACU, RN, vss, BBS= Clear.  

## 2019-08-18 NOTE — Op Note (Signed)
[facility] Patient Name: Carlos Baldwin Procedure Date: 08/18/2019 11:41 AM MRN: [id number] Endoscopist: Ladene Artist , MD Age: 84 Referring MD:  Date of Birth: 1934-10-18 Gender: Male Account #: [id number] Procedure:                Colonoscopy Indications:              Positive Cologuard test Medicines:                Monitored Anesthesia Care Procedure:                Pre-Anesthesia Assessment:                           - Prior to the procedure, a History and Physical                            was performed, and patient medications and                            allergies were reviewed. The patient's tolerance of                            previous anesthesia was also reviewed. The risks                            and benefits of the procedure and the sedation                            options and risks were discussed with the patient.                            All questions were answered, and informed consent                            was obtained. Prior Anticoagulants: The patient has                            taken no previous anticoagulant or antiplatelet                            agents. ASA Grade Assessment: II - A patient with                            mild systemic disease. After reviewing the risks                            and benefits, the patient was deemed in                            satisfactory condition to undergo the procedure.                           After obtaining informed consent, the colonoscope  was passed under direct vision. Throughout the                            procedure, the patient's blood pressure, pulse, and                            oxygen saturations were monitored continuously. The                            Colonoscope was introduced through the anus and                            advanced to the the cecum, identified by                            appendiceal orifice and ileocecal valve.  The                            ileocecal valve, appendiceal orifice, and rectum                            were photographed. The quality of the bowel                            preparation was good. The colonoscopy was performed                            without difficulty. The patient tolerated the                            procedure well. Scope In: 11:44:18 AM Scope Out: 11:59:28 AM Scope Withdrawal Time: 0 hours 12 minutes 38 seconds  Total Procedure Duration: 0 hours 15 minutes 10 seconds  Findings:                 The perianal and digital rectal examinations were                            normal.                           Three sessile polyps were found in the rectum (1)                            and transverse colon (2). The polyps were 6 to 8 mm                            in size. These polyps were removed with a cold                            snare. Resection and retrieval were complete.                           Multiple medium-mouthed diverticula were found in  the left colon. There was narrowing of the colon in                            association with the diverticular opening. There                            was evidence of diverticular spasm. There was no                            evidence of diverticular bleeding.                           Internal hemorrhoids were found during                            retroflexion. The hemorrhoids were small and Grade                            I (internal hemorrhoids that do not prolapse).                           The exam was otherwise without abnormality on                            direct and retroflexion views. Complications:            No immediate complications. Estimated blood loss:                            None. Estimated Blood Loss:     Estimated blood loss: none. Impression:               - Three 6 to 8 mm polyps in the rectum and in the                            transverse colon,  removed with a cold snare.                            Resected and retrieved.                           - Moderate diverticulosis in the left colon.                           - Internal hemorrhoids.                           - The examination was otherwise normal on direct                            and retroflexion views. Recommendation:           - Patient has a contact number available for                            emergencies. The signs and symptoms of  potential                            delayed complications were discussed with the                            patient. Return to normal activities tomorrow.                            Written discharge instructions were provided to the                            patient.                           - High fiber diet.                           - Continue present medications.                           - Await pathology results.                           - No repeat colonoscopy due to age. Ladene Artist, MD 08/18/2019 12:04:01 PM This report has been signed electronically.

## 2019-08-18 NOTE — Progress Notes (Signed)
[facility]  History reviewed.

## 2019-08-18 NOTE — Patient Instructions (Signed)
YOU HAD AN ENDOSCOPIC PROCEDURE TODAY AT THE [facility]:   Refer to the procedure report that was given to you for any specific questions about what was found during the examination.  If the procedure report does not answer your questions, please call your gastroenterologist to clarify.  If you requested that your care partner not be given the details of your procedure findings, then the procedure report has been included in a sealed envelope for you to review at your convenience later.  YOU SHOULD EXPECT: Some feelings of bloating in the abdomen. Passage of more gas than usual.  Walking can help get rid of the air that was put into your GI tract during the procedure and reduce the bloating. If you had a lower endoscopy (such as a colonoscopy or flexible sigmoidoscopy) you may notice spotting of blood in your stool or on the toilet paper. If you underwent a bowel prep for your procedure, you may not have a normal bowel movement for a few days.  Please Note:  You might notice some irritation and congestion in your nose or some drainage.  This is from the oxygen used during your procedure.  There is no need for concern and it should clear up in a day or so.  SYMPTOMS TO REPORT IMMEDIATELY:   Following lower endoscopy (colonoscopy or flexible sigmoidoscopy):  Excessive amounts of blood in the stool  Significant tenderness or worsening of abdominal pains  Swelling of the abdomen that is new, acute  Fever of 100F or higher  For urgent or emergent issues, a gastroenterologist can be reached at any hour by calling [phone].   DIET:  We do recommend a small meal at first, but then you may proceed to your regular diet.  Drink plenty of fluids but you should avoid alcoholic beverages for 24 hours.  ACTIVITY:  You should plan to take it easy for the rest of today and you should NOT DRIVE or use heavy machinery until tomorrow (because of the sedation medicines used during the test).     FOLLOW UP: Our staff will call the number listed on your records 48-72 hours following your procedure to check on you and address any questions or concerns that you may have regarding the information given to you following your procedure. If we do not reach you, we will leave a message.  We will attempt to reach you two times.  During this call, we will ask if you have developed any symptoms of COVID 19. If you develop any symptoms (ie: fever, flu-like symptoms, shortness of breath, cough etc.) before then, please call [phone].  If you test positive for Covid 19 in the 2 weeks post procedure, please call and report this information to us.    If any biopsies were taken you will be contacted by phone or by letter within the next 1-3 weeks.  Please call us at [phone] if you have not heard about the biopsies in 3 weeks.    SIGNATURES/CONFIDENTIALITY: You and/or your care partner have signed paperwork which will be entered into your electronic medical record.  These signatures attest to the fact that that the information above on your After Visit Summary has been reviewed and is understood.  Full responsibility of the confidentiality of this discharge information lies with you and/or your care-partner. 

## 2019-08-21 ENCOUNTER — Ambulatory Visit: Payer: Self-pay | Admitting: Adult Health

## 2019-08-22 ENCOUNTER — Telehealth: Payer: Self-pay | Admitting: *Deleted

## 2019-08-22 ENCOUNTER — Telehealth: Payer: Self-pay

## 2019-08-22 NOTE — Telephone Encounter (Signed)
Left message on follow up call. 

## 2019-08-22 NOTE — Telephone Encounter (Signed)
  Follow up Call-  Call back number 08/18/2019  Post procedure Call Back phone  # [phone]  Permission to leave phone message Yes  Some recent data might be hidden     No answer at # given.  LM on VM.

## 2019-08-23 ENCOUNTER — Encounter: Payer: Self-pay | Admitting: Gastroenterology

## 2019-09-11 ENCOUNTER — Ambulatory Visit: Payer: Medicare Other | Admitting: Internal Medicine

## 2019-09-18 ENCOUNTER — Emergency Department (HOSPITAL_BASED_OUTPATIENT_CLINIC_OR_DEPARTMENT_OTHER): Payer: Medicare Other

## 2019-09-18 ENCOUNTER — Other Ambulatory Visit: Payer: Self-pay

## 2019-09-18 ENCOUNTER — Encounter (HOSPITAL_BASED_OUTPATIENT_CLINIC_OR_DEPARTMENT_OTHER): Payer: Self-pay | Admitting: *Deleted

## 2019-09-18 ENCOUNTER — Emergency Department (HOSPITAL_BASED_OUTPATIENT_CLINIC_OR_DEPARTMENT_OTHER)
Admission: EM | Admit: 2019-09-18 | Discharge: 2019-09-19 | Disposition: A | Discharge status: Home / Self Care | Payer: Medicare Other | Attending: Emergency Medicine | Admitting: Emergency Medicine

## 2019-09-18 DIAGNOSIS — G44319 Acute post-traumatic headache, not intractable: Secondary | ICD-10-CM

## 2019-09-18 DIAGNOSIS — Z79899 Other long term (current) drug therapy: Secondary | ICD-10-CM | POA: Insufficient documentation

## 2019-09-18 DIAGNOSIS — S8002XA Contusion of left knee, initial encounter: Secondary | ICD-10-CM | POA: Diagnosis not present

## 2019-09-18 DIAGNOSIS — Z23 Encounter for immunization: Secondary | ICD-10-CM | POA: Diagnosis not present

## 2019-09-18 DIAGNOSIS — Y92410 Unspecified street and highway as the place of occurrence of the external cause: Secondary | ICD-10-CM | POA: Diagnosis not present

## 2019-09-18 DIAGNOSIS — I10 Essential (primary) hypertension: Secondary | ICD-10-CM | POA: Diagnosis not present

## 2019-09-18 DIAGNOSIS — R519 Headache, unspecified: Secondary | ICD-10-CM | POA: Diagnosis not present

## 2019-09-18 DIAGNOSIS — S61215A Laceration without foreign body of left ring finger without damage to nail, initial encounter: Secondary | ICD-10-CM | POA: Diagnosis not present

## 2019-09-18 DIAGNOSIS — S8001XA Contusion of right knee, initial encounter: Secondary | ICD-10-CM

## 2019-09-18 DIAGNOSIS — Y999 Unspecified external cause status: Secondary | ICD-10-CM | POA: Diagnosis not present

## 2019-09-18 DIAGNOSIS — S8991XA Unspecified injury of right lower leg, initial encounter: Secondary | ICD-10-CM | POA: Diagnosis not present

## 2019-09-18 DIAGNOSIS — Z743 Need for continuous supervision: Secondary | ICD-10-CM | POA: Diagnosis not present

## 2019-09-18 DIAGNOSIS — M25561 Pain in right knee: Secondary | ICD-10-CM | POA: Diagnosis not present

## 2019-09-18 DIAGNOSIS — S6992XA Unspecified injury of left wrist, hand and finger(s), initial encounter: Secondary | ICD-10-CM | POA: Diagnosis not present

## 2019-09-18 DIAGNOSIS — S0001XA Abrasion of scalp, initial encounter: Secondary | ICD-10-CM | POA: Diagnosis not present

## 2019-09-18 DIAGNOSIS — Y939 Activity, unspecified: Secondary | ICD-10-CM | POA: Insufficient documentation

## 2019-09-18 DIAGNOSIS — M79642 Pain in left hand: Secondary | ICD-10-CM | POA: Diagnosis not present

## 2019-09-18 DIAGNOSIS — M25562 Pain in left knee: Secondary | ICD-10-CM | POA: Diagnosis not present

## 2019-09-18 DIAGNOSIS — M19132 Post-traumatic osteoarthritis, left wrist: Secondary | ICD-10-CM

## 2019-09-18 DIAGNOSIS — M19032 Primary osteoarthritis, left wrist: Secondary | ICD-10-CM | POA: Diagnosis not present

## 2019-09-18 DIAGNOSIS — S0990XA Unspecified injury of head, initial encounter: Secondary | ICD-10-CM | POA: Diagnosis not present

## 2019-09-18 MED ORDER — LIDOCAINE HCL (PF) 1 % IJ SOLN
30.0000 mL | Freq: Once | INTRAMUSCULAR | Status: DC
  Filled 2019-09-18: qty 30

## 2019-09-18 MED ORDER — TETANUS-DIPHTH-ACELL PERTUSSIS 5-2.5-18.5 LF-MCG/0.5 IM SUSP
0.5000 mL | Freq: Once | INTRAMUSCULAR | Status: AC
  Administered 2019-09-18: 0.5 mL via INTRAMUSCULAR
  Filled 2019-09-18: qty 0.5
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
## 2019-09-18 NOTE — ED Notes (Signed)
Pt has a ring on his left hand but does not want to take it off.

## 2019-09-18 NOTE — ED Notes (Signed)
Wound irrigated.
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
## 2019-09-24 ENCOUNTER — Other Ambulatory Visit: Payer: Self-pay | Admitting: Internal Medicine

## 2019-09-24 DIAGNOSIS — I10 Essential (primary) hypertension: Secondary | ICD-10-CM

## 2019-09-24 MED ORDER — LOSARTAN POTASSIUM 100 MG PO TABS: ORAL_TABLET | ORAL | 3 refills | Status: DC
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
## 2019-09-26 ENCOUNTER — Encounter: Payer: Self-pay | Admitting: Physician Assistant

## 2019-09-26 ENCOUNTER — Ambulatory Visit
Admission: RE | Admit: 2019-09-26 | Discharge: 2019-09-26 | Disposition: A | Discharge status: Home / Self Care | Payer: Medicare Other | Source: Ambulatory Visit | Attending: Physician Assistant | Admitting: Physician Assistant

## 2019-09-26 ENCOUNTER — Ambulatory Visit (INDEPENDENT_AMBULATORY_CARE_PROVIDER_SITE_OTHER): Payer: Medicare Other | Admitting: Physician Assistant

## 2019-09-26 ENCOUNTER — Other Ambulatory Visit: Payer: Self-pay

## 2019-09-26 VITALS — BP 140/72 | HR 100 | Temp 97.7°F | Wt 178.6 lb

## 2019-09-26 DIAGNOSIS — S61412D Laceration without foreign body of left hand, subsequent encounter: Secondary | ICD-10-CM

## 2019-09-26 DIAGNOSIS — S61412A Laceration without foreign body of left hand, initial encounter: Secondary | ICD-10-CM | POA: Diagnosis not present

## 2019-09-26 MED ORDER — DOXYCYCLINE HYCLATE 100 MG PO CAPS: ORAL_CAPSULE | ORAL | 0 refills | Status: DC

## 2019-09-26 NOTE — Patient Instructions (Addendum)
get a splint from a store to wear for a few more day to prevent flexion/bending at your knuckles and promote healing.  Stop the neosporin, this can cause erythema if used past 2-3 days, can use vaseline.  Will refer to ortho for second opinion Will get Xray  Will send in doxycycline ABX for possible infection since allergic to sulfa.   INFORMATION ABOUT YOUR XRAY  Can walk into [address] for an Insurance account manager. They will have the order and take you back. You do not any paper work, I should get the result back today or tomorrow. This order is good for a year.  Can call [phone] to schedule an appointment if you wish.   Wound Infection A wound infection happens when tiny organisms (microorganisms) start to grow in a wound. A wound infection is most often caused by bacteria. Infection can cause the wound to break open or worsen. Wound infection needs treatment. If a wound infection is left untreated, complications can occur. Untreated wound infections may lead to an infection in the bloodstream (septicemia) or a bone infection (osteomyelitis). What are the causes? This condition is most often caused by bacteria growing in a wound. Other microorganisms, like yeast and fungi, can also cause wound infections. What increases the risk? The following factors may make you more likely to develop this condition:  Having a weak body defense system (immune system).  Having diabetes.  Taking steroid medicines for a long time (chronic use).  Smoking.  Being an older person.  Being overweight.  Taking chemotherapy medicines. What are the signs or symptoms? Symptoms of this condition include:  Having more redness, swelling, or pain at the wound site.  Having more blood or fluid at the wound site.  A bad smell coming from a wound or bandage (dressing).  Having a fever.  Feeling tired or fatigued.  Having warmth at or around the wound.  Having pus at the wound site. How is this  diagnosed? This condition is diagnosed with a medical history and physical exam. You may also have a wound culture or blood tests or both. How is this treated? This condition is usually treated with an antibiotic medicine.  The infection should improve 24-48 hours after you start antibiotics.  After 24-48 hours, redness around the wound should stop spreading, and the wound should be less painful. Follow these instructions at home: Medicines  Take or apply over-the-counter and prescription medicines only as told by your health care provider.  If you were prescribed an antibiotic medicine, take or apply it as told by your health care provider. Do not stop using the antibiotic even if you start to feel better. Wound care   Clean the wound each day, or as told by your health care provider. ? Wash the wound with mild soap and water. ? Rinse the wound with water to remove all soap. ? Pat the wound dry with a clean towel. Do not rub it.  Follow instructions from your health care provider about how to take care of your wound. Make sure you: ? Wash your hands with soap and water before and after you change your dressing. If soap and water are not available, use hand sanitizer. ? Change your dressing as told by your health care provider. ? Leave stitches (sutures), skin glue, or adhesive strips in place if your wound has been closed. These skin closures may need to stay in place for 2 weeks or longer. If adhesive strip edges start to  loosen and curl up, you may trim the loose edges. Do not remove adhesive strips completely unless your health care provider tells you to do that. Some wounds are left open to heal on their own.  Check your wound every day for signs of infection. Watch for: ? More redness, swelling, or pain. ? More fluid or blood. ? Warmth. ? Pus or a bad smell. General instructions  Keep the dressing dry until your health care provider says it can be removed.  Do not take  baths, swim, or use a hot tub until your health care provider approves. Ask your health care provider if you may take showers. You may only be allowed to take sponge baths.  Raise (elevate) the injured area above the level of your heart while you are sitting or lying down.  Do not scratch or pick at the wound.  Keep all follow-up visits as told by your health care provider. This is important. Contact a health care provider if:  Your pain is not controlled with medicine.  You have more redness, swelling, or pain around your wound.  You have more fluid or blood coming from your wound.  Your wound feels warm to the touch.  You have pus coming from your wound.  You continue to notice a bad smell coming from your wound or your dressing.  Your wound that was closed breaks open. Get help right away if:  You have a red streak going away from your wound.  You have a fever. Summary  A wound infection happens when tiny organisms (microorganisms) start to grow in a wound.  This condition is usually treated with an antibiotic medicine.  Follow instructions from your health care provider about how to take care of your wound.  Contact a health care provider if your wound infection does not begin to improve in 24-48 hours, or your symptoms worsen.  Keep all follow-up visits as told by your health care provider. This is important. This information is not intended to replace advice given to you by your health care provider. Make sure you discuss any questions you have with your health care provider. Document Revised: 04/05/2018 Document Reviewed: 04/05/2018 Elsevier Patient Education  [place].

## 2019-09-27 DIAGNOSIS — S61412A Laceration without foreign body of left hand, initial encounter: Secondary | ICD-10-CM | POA: Diagnosis not present

## 2019-09-27 DIAGNOSIS — M79642 Pain in left hand: Secondary | ICD-10-CM | POA: Diagnosis not present

## 2019-10-03 DIAGNOSIS — H52203 Unspecified astigmatism, bilateral: Secondary | ICD-10-CM | POA: Diagnosis not present

## 2019-10-03 DIAGNOSIS — H35371 Puckering of macula, right eye: Secondary | ICD-10-CM | POA: Diagnosis not present

## 2019-10-03 DIAGNOSIS — H2512 Age-related nuclear cataract, left eye: Secondary | ICD-10-CM | POA: Diagnosis not present

## 2019-10-04 DIAGNOSIS — S61412A Laceration without foreign body of left hand, initial encounter: Secondary | ICD-10-CM | POA: Diagnosis not present

## 2019-10-07 ENCOUNTER — Other Ambulatory Visit: Payer: Self-pay | Admitting: Internal Medicine

## 2019-10-07 MED ORDER — MELOXICAM 15 MG PO TABS: ORAL_TABLET | ORAL | 3 refills | Status: DC

## 2019-10-17 ENCOUNTER — Other Ambulatory Visit: Payer: Self-pay | Admitting: Internal Medicine

## 2019-10-17 MED ORDER — FINASTERIDE 5 MG PO TABS: ORAL_TABLET | ORAL | 3 refills | Status: DC

## 2019-10-18 DIAGNOSIS — S61412A Laceration without foreign body of left hand, initial encounter: Secondary | ICD-10-CM | POA: Diagnosis not present

## 2019-10-25 ENCOUNTER — Encounter: Payer: Self-pay | Admitting: Physician Assistant

## 2019-10-25 ENCOUNTER — Ambulatory Visit
Admission: RE | Admit: 2019-10-25 | Discharge: 2019-10-25 | Disposition: A | Discharge status: Home / Self Care | Payer: Medicare Other | Source: Ambulatory Visit | Attending: Physician Assistant | Admitting: Physician Assistant

## 2019-10-25 ENCOUNTER — Other Ambulatory Visit: Payer: Self-pay

## 2019-10-25 ENCOUNTER — Ambulatory Visit (INDEPENDENT_AMBULATORY_CARE_PROVIDER_SITE_OTHER): Payer: Medicare Other | Admitting: Physician Assistant

## 2019-10-25 VITALS — BP 136/66 | HR 71 | Temp 97.5°F | Wt 179.0 lb

## 2019-10-25 DIAGNOSIS — R6889 Other general symptoms and signs: Secondary | ICD-10-CM | POA: Diagnosis not present

## 2019-10-25 DIAGNOSIS — Z79899 Other long term (current) drug therapy: Secondary | ICD-10-CM

## 2019-10-25 DIAGNOSIS — I700 Atherosclerosis of aorta: Secondary | ICD-10-CM | POA: Diagnosis not present

## 2019-10-25 DIAGNOSIS — Z0001 Encounter for general adult medical examination with abnormal findings: Secondary | ICD-10-CM | POA: Diagnosis not present

## 2019-10-25 DIAGNOSIS — R05 Cough: Secondary | ICD-10-CM

## 2019-10-25 DIAGNOSIS — E559 Vitamin D deficiency, unspecified: Secondary | ICD-10-CM

## 2019-10-25 DIAGNOSIS — R7309 Other abnormal glucose: Secondary | ICD-10-CM

## 2019-10-25 DIAGNOSIS — N138 Other obstructive and reflux uropathy: Secondary | ICD-10-CM

## 2019-10-25 DIAGNOSIS — Z0000 Encounter for general adult medical examination without abnormal findings: Secondary | ICD-10-CM

## 2019-10-25 DIAGNOSIS — J439 Emphysema, unspecified: Secondary | ICD-10-CM | POA: Diagnosis not present

## 2019-10-25 DIAGNOSIS — R059 Cough, unspecified: Secondary | ICD-10-CM

## 2019-10-25 DIAGNOSIS — R195 Other fecal abnormalities: Secondary | ICD-10-CM

## 2019-10-25 DIAGNOSIS — I10 Essential (primary) hypertension: Secondary | ICD-10-CM

## 2019-10-25 DIAGNOSIS — N401 Enlarged prostate with lower urinary tract symptoms: Secondary | ICD-10-CM

## 2019-10-25 DIAGNOSIS — E782 Mixed hyperlipidemia: Secondary | ICD-10-CM

## 2019-10-25 NOTE — Patient Instructions (Addendum)
INFORMATION ABOUT YOUR XRAY  Can walk into [address] for an Insurance account manager. They will have the order and take you back. You do not any paper work, I should get the result back today or tomorrow. This order is good for a year.  Can call [phone] to schedule an appointment if you wish.   May send to pulmonary for pulmonary function test  INFORMATION ABOUT YOUR STEROID INHALER  Can do steroid inhaler, NEED TO DO DAILY, this is NOT a rescue inhaler so if you are acutely short of breath please use your albuterol or call 911.  Do 1 puff ONCE a day.  Do before you brush your teeth OR wash your mouth afterwards.  IF YOU DO NOT [place] YOUR MOUTH OUT IT CAN CAUSE YEAST Can do 2 tsp vinegar with water and switch to help prevent yeast or help yeast in your mouth.   Go to the ER if any chest pain, shortness of breath, nausea, dizziness, severe HA, changes vision/speech   VACCINE Currently, there is a hotline to call to schedule vaccination appointments as no walk-ins will be accepted.  Number: [phone].       If an appointment is not available please go to [email or web address] to sign up for notification when additional vaccine appointments are available.   [facility] and [facility] is going to start caring the vaccine for patients 67 and older so please check in with your local pharmacy to see if they will be starting to give the vaccines.     If you have further questions or concerns about the vaccine process, please visit [email or web address]  Individuals seeking information about the vaccines and state's phased distribution plan can learn more by going to - [email or web address]  [email or web address]

## 2019-10-25 NOTE — Progress Notes (Signed)
MEDICARE ANNUAL WELLNESS VISIT AND FOLLOW UP UHC Assessment:   Encounter for Medicare annual wellness exam 1 year  Medication management Monitor  Pulmonary emphysema, unspecified emphysema type ([place]) On recent CXR due to cough Given trelegy inhaler Non smoker, will monitor  Aortic atherosclerosis ([place]) Via CXR 10/2019 Control blood pressure, cholesterol, glucose, increase exercise.   Cough -     DG Chest 2 View; Future - did not response to GERD meds, has been a year, has history of asthma, can with exertion, will treat as asthma, get CXR rule out mass/cancer- may refer to pulmonary for PFTs/evaluation pending results.   Abnormal glucose Discussed disease progression and risks Discussed diet/exercise, weight management and risk modification  Essential hypertension - continue medications, DASH diet, exercise and monitor at home. Call if greater than 130/80.   BPH with obstruction/lower urinary tract symptoms Monitor  Vitamin D deficiency Continue supplement  Hyperlipidemia, mixed decrease fatty foods increase activity.   Positive colorectal cancer screening using Cologuard test Had colonoscopy, following with GI now.    Future Appointments  Date Time Provider [place]  11/15/2019  3:30 PM Vicie Mutters, PA-C [facility] None  03/20/2020  9:00 AM Unk Pinto, MD [facility] None     Plan:   During the course of the visit the patient was educated and counseled about appropriate screening and preventive services including:    Pneumococcal vaccine   Influenza vaccine  Td vaccine  Screening electrocardiogram  Colorectal cancer screening  Diabetes screening  Glaucoma screening  Nutrition counseling     Subjective:  Carlos Baldwin is a 85 y.o. male who presents for Medicare Annual Wellness Visit and 3 month follow up for HTN, hyperlipidemia, prediabetes, and vitamin D Def.   Never smoker, never secondary exposure. Cop for 50 years,  still working. Has remote history of asthma, diagnosed at 85 years old, has history of allergies, will wear a mask when cutting grass/etc.  He reports 1 year of persistent dry cough. Was tried on zantac and mucinex with some help.  Worse when he is active, walking around, talking. Better at night when lying down, sleeps well.  No wheezing, no SOB.  He does not take the mobic often.  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  Wt Readings from Last 3 Encounters:  10/25/19 179 lb (81.2 kg)  09/26/19 178 lb 9.6 oz (81 kg)  09/18/19 170 lb (77.1 kg)   His blood pressure has been controlled at home, today their BP is BP: 136/66 He does workout, he rides bike at home, has retired from police work.   He denies chest pain, shortness of breath, dizziness.   He is not on cholesterol medication and denies myalgias. His cholesterol is at goal. The cholesterol last visit was:   Lab Results  Component Value Date   CHOL 154 03/08/2019   HDL 57 03/08/2019   LDLCALC 84 03/08/2019   TRIG 47 03/08/2019   CHOLHDL 2.7 03/08/2019   He has been working on diet and exercise for prediabetes, and denies paresthesia of the feet, polydipsia and polyuria. Last A1C in the office was:   Lab Results  Component Value Date   HGBA1C 5.9 (H) 06/08/2019   Patient is on Vitamin D supplement, was off Lab Results  Component Value Date   VD25OH 32 03/08/2019   Lab Results  Component Value Date  GFRNONAA 80 06/08/2019     Medication Review: Current Outpatient Medications on File Prior to Visit  Medication Sig Dispense Refill  . CHOLECALCIFEROL PO Take 2,000 Units by mouth daily.    . finasteride (PROSCAR) 5 MG tablet Take 1 tablet Daily for Prostate 90 tablet 3  . losartan (COZAAR) 100 MG tablet Take 1 tablet Daily for BP 90 tablet 3  . meloxicam (MOBIC) 15 MG tablet Take 1/2 to 1 tablet Daily with Food for  Pain & Inflammation 90 tablet 3  . Polyethyl Glycol-Propyl Glycol (SYSTANE OP) Place 1 drop into both eyes 3 (three) times daily as needed (Dry eyes).      No current facility-administered medications on file prior to visit.   Review of Systems  Constitutional: Negative for chills, diaphoresis, fever, malaise/fatigue and weight loss.  HENT: Negative for ear discharge, ear pain, hearing loss, nosebleeds and tinnitus.   Eyes: Negative.   Respiratory: Positive for cough and wheezing. Negative for hemoptysis, sputum production, shortness of breath and stridor.   Cardiovascular: Negative for chest pain, palpitations, claudication and leg swelling.  Gastrointestinal: Negative.   Genitourinary: Negative.   Musculoskeletal: Negative for back pain, falls, joint pain, myalgias and neck pain.  Skin: Negative.   Neurological: Negative.  Negative for weakness and headaches.  Psychiatric/Behavioral: Negative.  Negative for depression, memory loss and substance abuse. The patient is not nervous/anxious and does not have insomnia.     Current Problems (verified) Patient Active Problem List   Diagnosis Date Noted  . Aortic atherosclerosis ([place]) 10/26/2019  . COPD (chronic obstructive pulmonary disease) ([place]) 10/26/2019  . Positive colorectal cancer screening using Cologuard test 07/17/2019  . BPH with obstruction/lower urinary tract symptoms 03/07/2019  . Abnormal glucose 03/07/2019  . Prediabetes 12/21/2013  . Medication management 12/21/2013  . Essential hypertension 08/02/2013  . Hyperlipidemia, mixed 08/02/2013  . Vitamin D deficiency 08/02/2013    Screening Tests Immunization History  Administered Date(s) Administered  . Influenza, High Dose Seasonal PF 06/03/2018, 05/25/2019  . Pneumococcal Conjugate-13 06/27/2014  . Pneumococcal Polysaccharide-23 09/07/2006  . Td 03/08/2019  . Tdap 09/18/2019  . Zoster 09/07/2008   Preventative care: Last colonoscopy: 08/2019 negative  cologuard  2020 +- had colonoscopy [place] AB 12/2011 CXR 2002 CT head 09/2019  Prior vaccinations: TD or Tdap: 2020  Influenza: 2020 Pneumococcal: 2008 Prevnar: 2015 Shingles/Zostavax: 2010  Names of Other Physician/Practitioners you currently use: 1. [facility] here for primary care Dr. Ellie Lunch- vision, last visit 2020 Dr. Sabas Sous, last visit 2020, goes q39m  Patient Care Team: Unk Pinto, MD as PCP - General (Internal Medicine) Garald Balding, MD as Consulting Physician (Orthopedic Surgery) Ladene Artist, MD as Consulting Physician (Gastroenterology) Posey Pronto, DDS as Consulting Physician (Dentistry) Luberta Mutter, MD as Consulting Physician (Ophthalmology)   Allergies Allergies  Allergen Reactions  . Atenolol Other (See Comments)    "not sure"  . Pravastatin     Joint pain  . Simvastatin     Joint pain  . Sulfonamide Derivatives Swelling and Rash    SURGICAL HISTORY He  has a past surgical history that includes Knee surgery (Right, 1972); left knee arthroscopy (01-14-2012); Total knee arthroplasty (Left, 11/04/2015); and Total knee arthroplasty (Right, 10/11/2017). FAMILY HISTORY His family history is not on file. - mother/father passed in 90's no history, siblings healthy SOCIAL HISTORY He  reports that he has never smoked. He has never used smokeless tobacco. He reports that he does not drink alcohol or use  drugs.  MEDICARE WELLNESS OBJECTIVES: Physical activity: Current Exercise Habits: Home exercise routine;The patient has a physically strenuous job, but has no regular exercise apart from work., Type of exercise: walking(patient walks a lot at work and is active at home) Cardiac risk factors: Cardiac Risk Factors include: advanced age (>31men, >24 women);dyslipidemia;hypertension;male gender Depression/mood screen:   Depression screen [facility] 2/9 10/26/2019  Decreased Interest 0  Down, Depressed, Hopeless 0  PHQ - 2 Score 0     ADLs:  In your present state of health, do you have any difficulty performing the following activities: 10/26/2019 06/08/2019  Hearing? Tempie Donning  Comment has bilateral hearing aids has bilateral hearing  Vision? N N  Difficulty concentrating or making decisions? N N  Walking or climbing stairs? N N  Dressing or bathing? N N  Doing errands, shopping? N N  Some recent data might be hidden     Cognitive Testing  Alert? Yes  Normal Appearance?Yes  Oriented to person? Yes  Place? Yes   Time? Yes  Recall of three objects?  Yes  Can perform simple calculations? Yes  Displays appropriate judgment?Yes  Can read the correct time from a watch face?Yes  EOL planning: Does Patient Have a Medical Advance Directive?: Yes Type of Advance Directive: Healthcare Power of Attorney, Living will Does patient want to make changes to medical advance directive?: No - Patient declined Copy of [place] in Chart?: No - copy requested yes   Objective:   Blood pressure 136/66, pulse 71, temperature (!) 97.5 F (36.4 C), weight 179 lb (81.2 kg), SpO2 98 %. Body mass index is 25.68 kg/m.  General appearance: alert, no distress, WD/WN, male HEENT: normocephalic, sclerae anicteric, TMs pearly, nares patent, no discharge or erythema, pharynx normal Oral cavity: MMM, no lesions Neck: supple, no lymphadenopathy, no thyromegaly, no masses Heart: RRR, distant heart sounds, normal S1, S2, no murmurs Lungs: CTA bilaterally, slight barrel chest, no wheezes, rhonchi, or rales Abdomen: +bs, soft, non tender, non distended, no masses, no hepatomegaly, no splenomegaly Musculoskeletal: nontender, no swelling, no obvious deformity Extremities: no edema, no cyanosis, no clubbing Skin: left face and left posterior ear with erythematous nodule/plaques Pulses: 2+ symmetric, upper and lower extremities, normal cap refill Neurological: alert, oriented x 3, CN2-12 intact, strength normal upper extremities  and lower extremities, sensation normal throughout, DTRs 2+ throughout, no cerebellar signs, gait antalgic due to knees Psychiatric: normal affect, behavior normal, pleasant   Medicare Attestation I have personally reviewed: The patient's medical and social history Their use of alcohol, tobacco or illicit drugs Their current medications and supplements The patient's functional ability including ADLs,fall risks, home safety risks, cognitive, and hearing and visual impairment Diet and physical activities Evidence for depression or mood disorders  The patient's weight, height, BMI, and visual acuity have been recorded in the chart.  I have made referrals, counseling, and provided education to the patient based on review of the above and I have provided the patient with a written personalized care plan for preventive services.     Vicie Mutters, PA-C   10/26/2019

## 2019-10-26 ENCOUNTER — Encounter: Payer: Self-pay | Admitting: Physician Assistant

## 2019-10-26 ENCOUNTER — Other Ambulatory Visit: Payer: Self-pay | Admitting: Physician Assistant

## 2019-10-26 DIAGNOSIS — R059 Cough, unspecified: Secondary | ICD-10-CM

## 2019-10-26 DIAGNOSIS — R05 Cough: Secondary | ICD-10-CM

## 2019-10-26 DIAGNOSIS — J439 Emphysema, unspecified: Secondary | ICD-10-CM

## 2019-10-26 DIAGNOSIS — J449 Chronic obstructive pulmonary disease, unspecified: Secondary | ICD-10-CM | POA: Insufficient documentation

## 2019-10-26 DIAGNOSIS — I700 Atherosclerosis of aorta: Secondary | ICD-10-CM | POA: Insufficient documentation

## 2019-11-06 ENCOUNTER — Ambulatory Visit
Admission: RE | Admit: 2019-11-06 | Discharge: 2019-11-06 | Disposition: A | Discharge status: Home / Self Care | Payer: Medicare Other | Source: Ambulatory Visit | Attending: Physician Assistant | Admitting: Physician Assistant

## 2019-11-06 DIAGNOSIS — R05 Cough: Secondary | ICD-10-CM

## 2019-11-06 DIAGNOSIS — R059 Cough, unspecified: Secondary | ICD-10-CM

## 2019-11-06 DIAGNOSIS — J8410 Pulmonary fibrosis, unspecified: Secondary | ICD-10-CM | POA: Diagnosis not present

## 2019-11-07 ENCOUNTER — Telehealth: Payer: Self-pay | Admitting: Physician Assistant

## 2019-11-07 DIAGNOSIS — R918 Other nonspecific abnormal finding of lung field: Secondary | ICD-10-CM | POA: Insufficient documentation

## 2019-11-07 DIAGNOSIS — R059 Cough, unspecified: Secondary | ICD-10-CM

## 2019-11-07 DIAGNOSIS — R05 Cough: Secondary | ICD-10-CM

## 2019-11-07 NOTE — Telephone Encounter (Signed)
Will refer to pulmonary for PFTS/evaluation for cough and will need repeat CT scan of chest in 6 months.

## 2019-11-12 ENCOUNTER — Ambulatory Visit: Payer: Medicare Other | Attending: Internal Medicine

## 2019-11-12 DIAGNOSIS — Z23 Encounter for immunization: Secondary | ICD-10-CM | POA: Insufficient documentation

## 2019-11-12 NOTE — Progress Notes (Signed)
   Covid-19 Vaccination Clinic  Name:  Carlos Baldwin    MRN: [id number] DOB: 01-08-1935  11/12/2019  Carlos Baldwin was observed post Covid-19 immunization for 15 minutes without incident. He was provided with Vaccine Information Sheet and instruction to access the V-Safe system.   Carlos Baldwin was instructed to call 911 with any severe reactions post vaccine: Marland Kitchen Difficulty breathing  . Swelling of face and throat  . A fast heartbeat  . A bad rash all over body  . Dizziness and weakness   Immunizations Administered    Name Date Dose VIS Date Route   Pfizer COVID-19 Vaccine 11/12/2019  1:05 PM 0.3 mL 08/18/2019 Intramuscular   Manufacturer: [employer]   Lot: [id number]   [place]: [id number]

## 2019-11-15 ENCOUNTER — Ambulatory Visit (INDEPENDENT_AMBULATORY_CARE_PROVIDER_SITE_OTHER): Payer: Medicare Other | Admitting: Physician Assistant

## 2019-11-15 ENCOUNTER — Other Ambulatory Visit: Payer: Self-pay

## 2019-11-15 ENCOUNTER — Encounter: Payer: Self-pay | Admitting: Physician Assistant

## 2019-11-15 VITALS — BP 122/80 | HR 90 | Temp 97.5°F | Wt 178.0 lb

## 2019-11-15 DIAGNOSIS — J439 Emphysema, unspecified: Secondary | ICD-10-CM | POA: Diagnosis not present

## 2019-11-15 DIAGNOSIS — I700 Atherosclerosis of aorta: Secondary | ICD-10-CM

## 2019-11-15 DIAGNOSIS — L570 Actinic keratosis: Secondary | ICD-10-CM

## 2019-11-15 MED ORDER — TRELEGY ELLIPTA 100-62.5-25 MCG/INH IN AEPB: 100.0000 ug | INHALATION_SPRAY | Freq: Every day | RESPIRATORY_TRACT | 6 refills | Status: DC

## 2019-11-15 NOTE — Progress Notes (Signed)
Chief Complaint: Patient presents for evaluation of skin lesions. left face and left posterior ear with erythematous nodule/plaques  Patient was seen for a cough in Feb, had CXR/CT that showed COPD and 7 mm pulmonary nodules. He prefers to not see pulmonary at this time, states the inhaler helps, given trelegy. Will repeat CT chest   Exam: normal complete skin exam, no suspicious lesions, actinic keratoses  Procedure Details   The risks, benefits, indications, potential complications, and alternatives were explained to the patient and informed consent obtained.  Liquid nitrogen was use in a  Double  / triple freeze and thaw technique. The patient tolerated the procedure well.   Condition: Stable  Complications:  None  Diagnosis:  Pulmonary emphysema, unspecified emphysema type ([place]) -     Fluticasone-Umeclidin-Vilant (TRELEGY ELLIPTA) 100-62.5-25 MCG/INH AEPB; Inhale 100 mcg into the lungs daily.  Aortic atherosclerosis ([facility]) Control blood pressure, cholesterol, glucose, increase exercise.   Actinic keratosis of scalp 3 freeze and thaw technique after verbal permission Tolerated well If not removed will schedule for another treatment or removal  Procedure code:  17000 [id number]  Plan: 1. Patient educated that the area will begin to heal in approximately a week.  2. Warning signs of infection were reviewed.    3. Recommended that the patient use OTC acetaminophen as needed for pain.    Future Appointments  Date Time Provider [place]  12/12/2019 12:15 PM [place] [facility]  03/20/2020  9:00 AM Unk Pinto, MD [facility] None

## 2019-11-15 NOTE — Patient Instructions (Signed)
Cryosurgery for Skin Conditions Cryosurgery, also called cryotherapy, is the use of extremely cold liquid (liquid nitrogen) to freeze and remove abnormal or diseased tissue. Cryosurgery may be used to remove certain growths on the skin, such as:  Warts.  Skin sores that could turn into cancer (precancerous skin lesions or actinic keratoses).  Some skin cancers. Cryosurgery usually takes a few minutes, and it can be done in your health care provider's office. Tell a health care provider about:  Any allergies you have.  All medicines you are taking, including vitamins, herbs, eye drops, creams, and over-the-counter medicines.  Any problems you or family members have had with anesthetic medicines.  Any blood disorders you have.  Any surgeries you have had.  Any medical conditions you have.  Whether you are pregnant or may be pregnant. What are the risks? Generally, this is a safe procedure. However, problems may occur, including:  Infection.  Bleeding.  Scarring.  Changes in skin color (lighter or darker than normal skin tone).  Swelling.  Hair loss in the treated area.  Damage to nearby structures or organs, such as nerve damage and loss of feeling. This is rare. What happens before the procedure? No specific preparation is needed for this procedure. Your health care provider will describe the procedure and will discuss the benefits and risks of the procedure with you. What happens during the procedure?   Your procedure will be performed using one of the following methods: ? Your health care provider may apply a device (probe) to the skin. The probe has liquid nitrogen flowing through it to cool it down. The probe will be applied to the skin until the skin is frozen and destroyed. ? Your health care provider may apply liquid nitrogen to the skin with a swab or by spraying it on the skin until the skin is frozen and destroyed.  The treated area may be covered with a  bandage (dressing). These procedures may vary among health care providers and clinics. What can I expect after procedure? After your procedure, it is common to have redness, swelling, and a blister that forms over the treated area. The blister may contain a small amount of blood. You may also have some mild stinging or a burning sensation that will resolve. If a blister forms, it will break open on its own after about 2-4 weeks, leaving a scab. Then the treated area will heal. After healing, there is usually little or no scarring. Follow these instructions at home: Caring for the treated area   Follow instructions from your health care provider about how to take care of the treated area. If you have a dressing, make sure you: ? Wash your hands with soap and water for at least 20 seconds before and after you change your dressing. If soap and water are not available, use hand sanitizer. ? Change your dressing as told by your health care provider. ? Keep the dressing and the treated area clean and dry. If the dressing gets wet, change it right away. ? Clean the treated area with soap and water. ? Keep the area covered with a dressing until it heals, or for as long as told by your health care provider.  Check the treated area every day for signs of infection. Check for: ? More redness, swelling, or pain. ? More fluid or blood. ? Warmth. ? Pus or a bad smell.  If a blister forms, do not pick at your blister or try to break it open.  Doing this can cause infection and scarring.  Do not apply any medicine, cream, or lotion to the treated area unless directed by your health care provider. General instructions  Take over-the-counter and prescription medicines only as told by your health care provider.  Do not use any products that contain nicotine or tobacco, such as cigarettes, e-cigarettes, and chewing tobacco. These can delay healing. If you need help quitting, ask your health care  provider.  Do not take baths, swim, use a hot tub, hand-wash dishes, or otherwise soak the treated area until your health care provider approves. Ask your health care provider if you may take showers. You may only be allowed to take sponge baths.  Keep all follow-up visits as told by your health care provider. This is important. Contact a health care provider if:  You have more redness, swelling, or pain around the treated area.  You have more fluid or blood coming from the treated area.  The treated area feels warm to the touch.  You have pus or a bad smell coming from the treated area.  Your blister becomes large and painful. Get help right away if:  You have a fever and have redness spreading from the treated area. Summary  Cryosurgery, also called cryotherapy, is the use of extreme cold (liquid nitrogen) to freeze and remove abnormal growths or diseased tissue.  Cryosurgery usually takes a few minutes, and it can be done in your health care provider's office.  Generally, this is a safe procedure that requires no specific preparation beforehand.  There are two different methods for performing cryosurgery. One method involves using a device (probe) to freeze the growth, and the other method involves applying liquid nitrogen directly to the growth.  After treatment with cryotherapy, follow care instructions as provided by your health care provider. Watch for signs of infection. If a blister forms, do not pick at it or try to break it open. This information is not intended to replace advice given to you by your health care provider. Make sure you discuss any questions you have with your health care provider. Document Revised: 04/12/2019 Document Reviewed: 04/12/2019 Elsevier Patient Education  [place].

## 2019-12-12 ENCOUNTER — Ambulatory Visit: Payer: Medicare Other | Attending: Internal Medicine

## 2019-12-12 DIAGNOSIS — Z23 Encounter for immunization: Secondary | ICD-10-CM

## 2019-12-12 NOTE — Progress Notes (Signed)
   Covid-19 Vaccination Clinic  Name:  Carlos Baldwin    MRN: [id number] DOB: [birth year]  12/12/2019  [name] was observed post Covid-19 immunization for 15 minutes without incident. He was provided with Vaccine Information Sheet and instruction to access the V-Safe system.   [name] was instructed to call 911 with any severe reactions post vaccine: Marland Kitchen Difficulty breathing  . Swelling of face and throat  . A fast heartbeat  . A bad rash all over body  . Dizziness and weakness   Immunizations Administered    Name Date Dose VIS Date Route   Pfizer COVID-19 Vaccine 12/12/2019 12:14 PM 0.3 mL 08/18/2019 Intramuscular   Manufacturer: [employer], [employer]   Lot: [id number]   [place]: [id number]

## 2020-02-07 DIAGNOSIS — M79642 Pain in left hand: Secondary | ICD-10-CM | POA: Diagnosis not present

## 2020-03-03 IMAGING — CT CT CHEST W/O CM
2 of 4 series · 15 of 36 positions shown, 18 images · non-contrast
Comparison: Chest x-ray 10/25/2019

CLINICAL DATA: Chronic cough, abnormal chest x-ray

EXAM:
CT CHEST WITHOUT CONTRAST
TECHNIQUE: Multidetector CT imaging of the chest was performed following the
standard protocol without IV contrast.

[Series 2: chest 2.00 br40 s3 · axial · 0.73mm/px · z∈[+1559,+1837]mm · 12 of 165 slices shown, 15 images (1 of 2)]
[im 13/165  mediastinal]
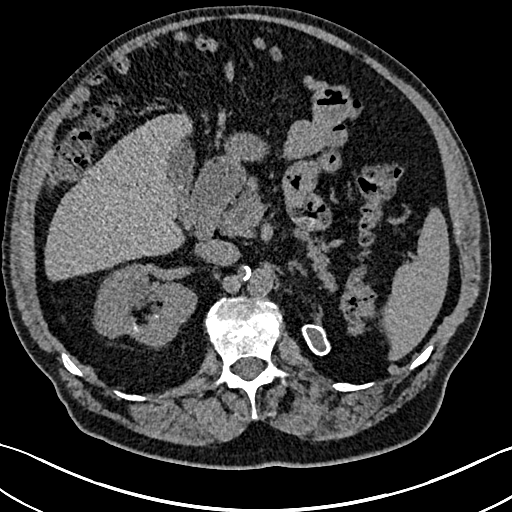
[im 13/165  lung]
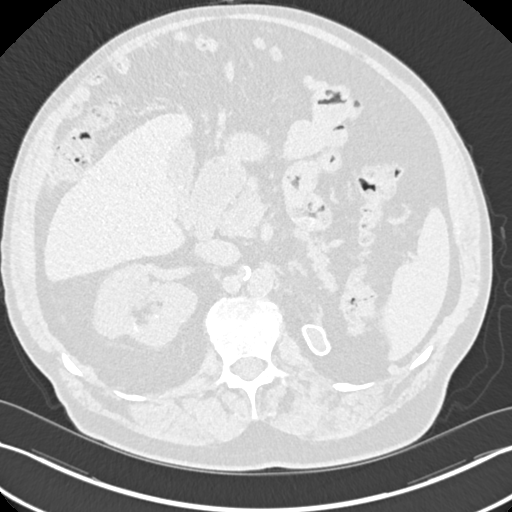
[im 26/165  lung]
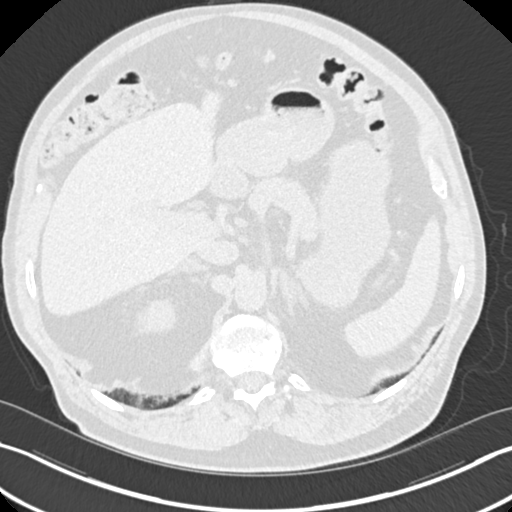
[im 38/165  lung]
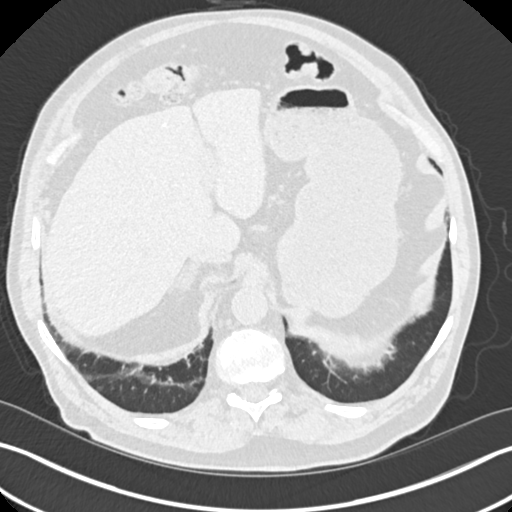
[im 51/165  lung]
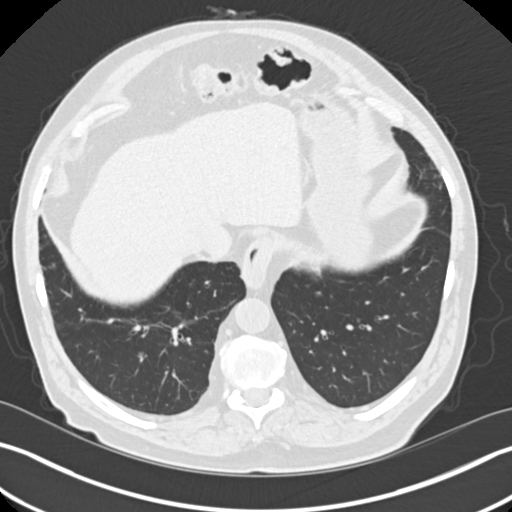
[im 64/165  mediastinal]
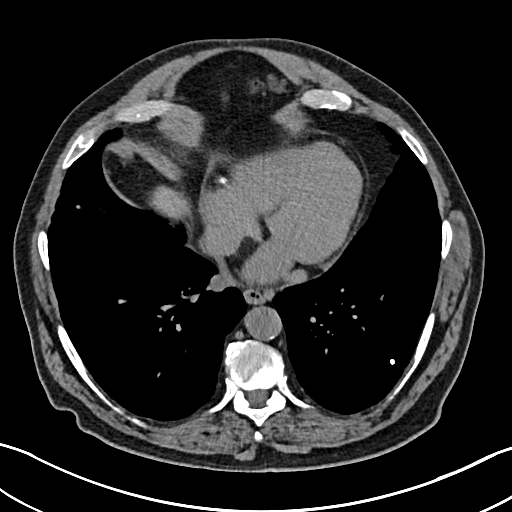
[im 64/165  lung]
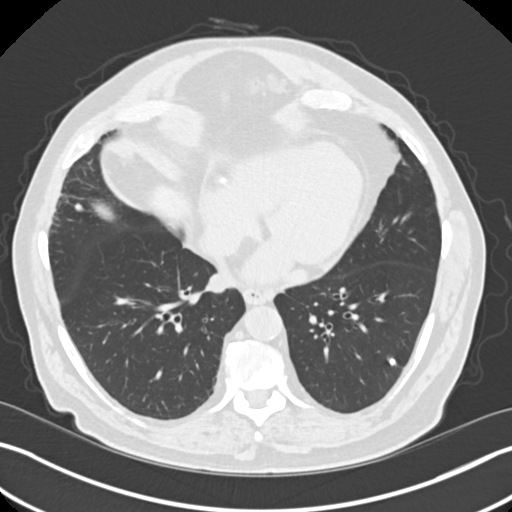
[im 76/165  lung]
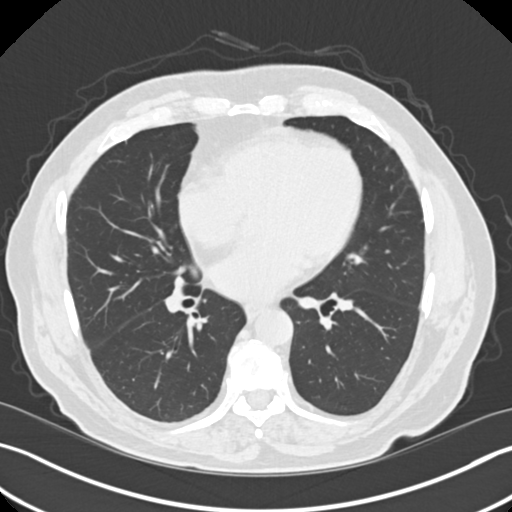
[im 89/165  lung]
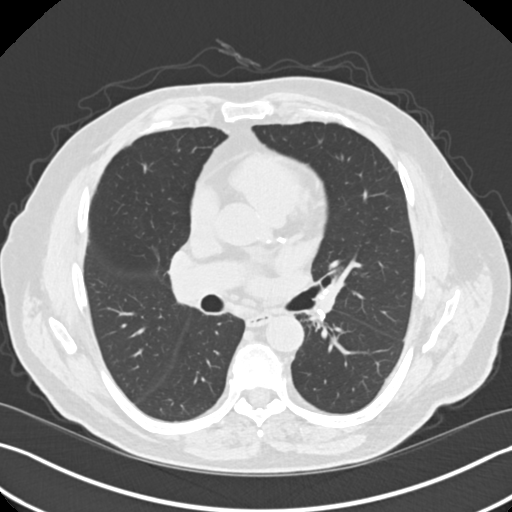
[im 101/165  lung]
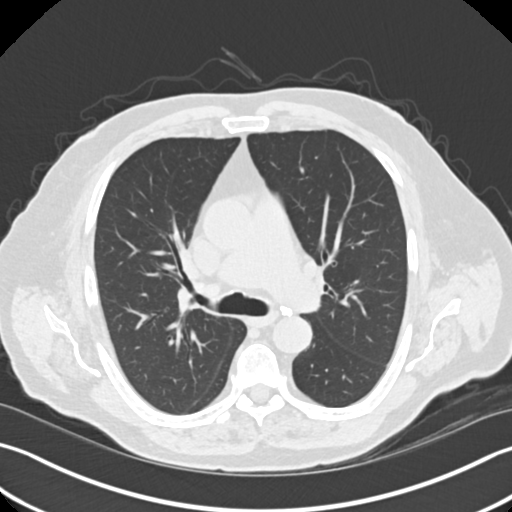
[im 114/165  mediastinal]
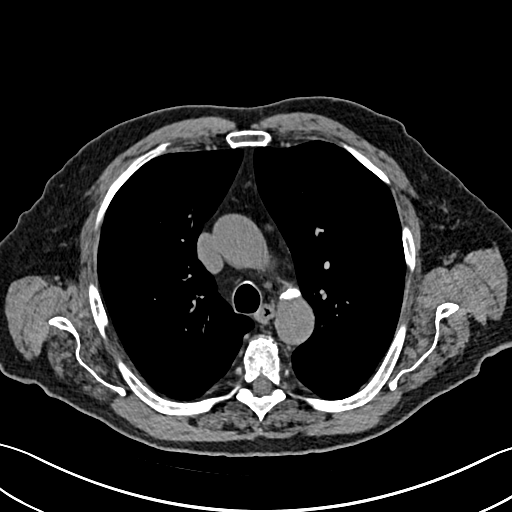
[im 114/165  lung]
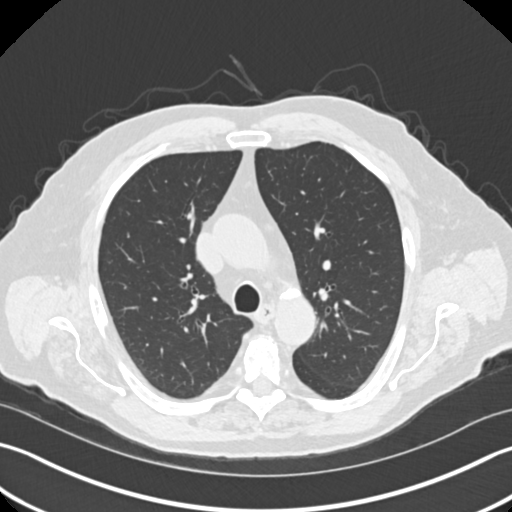
[im 127/165  lung]
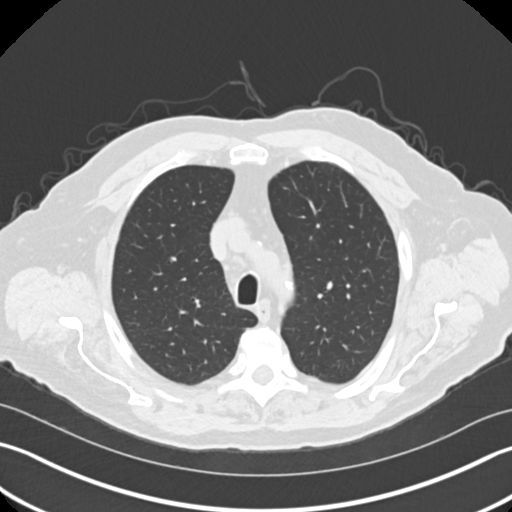
[im 139/165  lung]
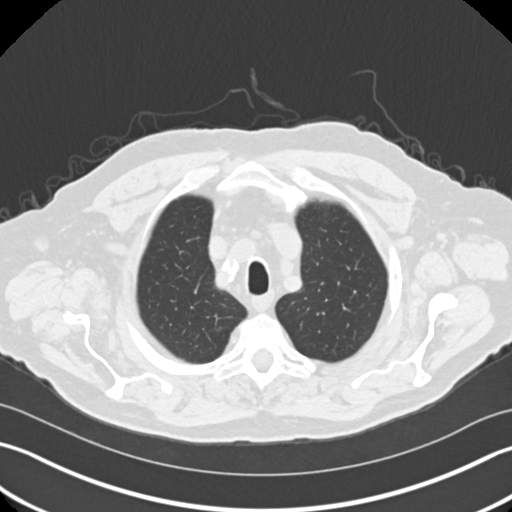
[im 152/165  lung]
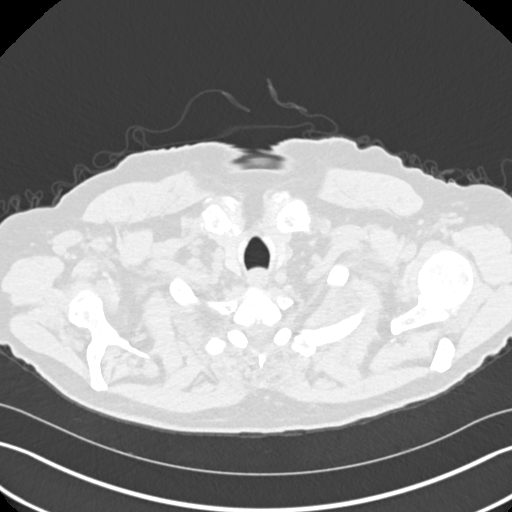

[Series 4: chest 2.00 br40 s3 · coronal · 0.65mm/px · 3 of 186 slices shown (2 of 2)]
[im 38/186  lung]
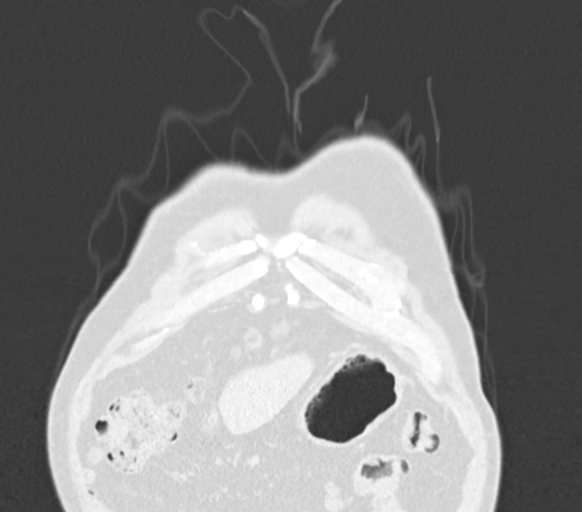
[im 75/186  lung]
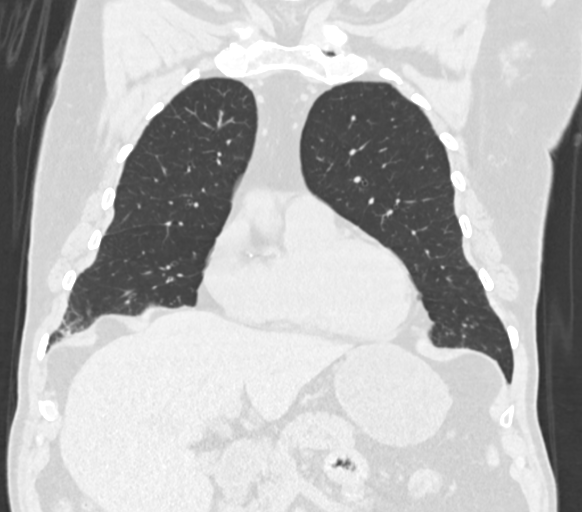
[im 112/186  lung]
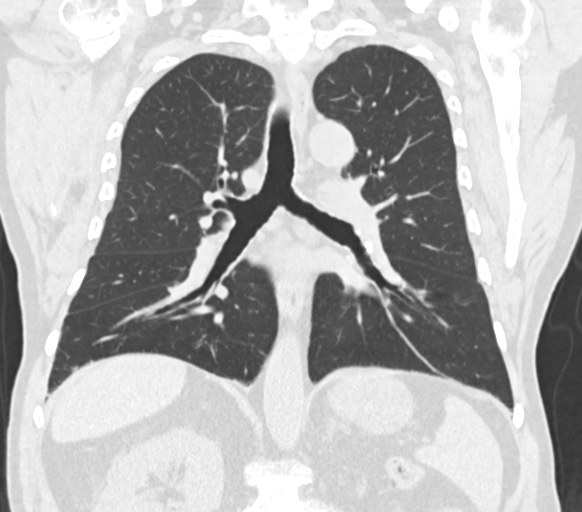

[15 of 36 positions shown; findings below may reference images not displayed]

FINDINGS: Cardiovascular: Normal heart size. No pericardial effusion. Thoracic
aorta is normal in course and caliber. Scattered atherosclerotic
calcifications of the aorta and coronary arteries.

Mediastinum/Nodes: No axillary, mediastinal, or hilar
lymphadenopathy. Small calcified mediastinal and left hilar lymph
nodes suggesting sequela of chronic granulomatous disease. Thyroid,
trachea, and esophagus within normal limits.

Lungs/Pleura: Noncalcified 7 mm right middle lobe pulmonary nodule
(series 8, image 103). 7 mm calcified granuloma within the
peripheral aspect of the left lower lobe (series 8, image 99). There
are a few additional scattered tiny 1-2 mm calcified granulomas
within both lungs. Lungs are otherwise clear without focal
consolidation, pleural effusion, or pneumothorax.

Upper Abdomen: Cholelithiasis. Atrophic left kidney. Slightly
nodular configuration of the bilateral adrenal glands. No acute
findings within the visualized upper abdomen.

Musculoskeletal: Advanced degenerative changes of the bilateral
shoulders. No acute osseous findings. No focal chest wall
abnormality.
IMPRESSION: 1. Noncalcified 7 mm pulmonary nodule within the right middle lobe.
Non-contrast chest CT at 6-12 months is recommended. If the nodule
is stable at time of repeat CT, then future CT at 18-24 months (from
today's scan) is considered optional for low-risk patients, but is
recommended for high-risk patients. This recommendation follows the
consensus statement: Guidelines for Management of Incidental
Pulmonary Nodules Detected on CT Images: From the [HOSPITAL]
2. Cholelithiasis.

Aortic Atherosclerosis ([id number]-OTY.Y).

## 2020-03-06 DIAGNOSIS — S61412A Laceration without foreign body of left hand, initial encounter: Secondary | ICD-10-CM | POA: Diagnosis not present

## 2020-03-06 DIAGNOSIS — M79642 Pain in left hand: Secondary | ICD-10-CM | POA: Diagnosis not present

## 2020-03-19 ENCOUNTER — Encounter: Payer: Self-pay | Admitting: Internal Medicine

## 2020-03-19 NOTE — Progress Notes (Addendum)
Annual  Screening/Preventative Visit  & Comprehensive Evaluation & Examination     This very nice 85 y.o.  MWM presents for a Screening /Preventative Visit & comprehensive evaluation and management of multiple medical co-morbidities.  Patient has been followed for HTN, HLD, Prediabetes and Vitamin D Deficiency. Patient relates that thee Trelegy Inh Rx'd for his cough has been very beneficial, but has late day break-thru cough.      HTN predates circa 2003. Patient's BP has been controlled at home.  Today's BP is at goal - 116/80. Patient denies any cardiac symptoms as chest pain, palpitations, shortness of breath, dizziness or ankle swelling.     Patient's hyperlipidemia is controlled with diet. Last lipids were at goal:  Lab Results  Component Value Date   CHOL 154 03/08/2019   HDL 57 03/08/2019   LDLCALC 84 03/08/2019   TRIG 47 03/08/2019   CHOLHDL 2.7 03/08/2019       Patient has hx/o prediabetes (A1c 6.1% / 2011) and patient denies reactive hypoglycemic symptoms, visual blurring, diabetic polys or paresthesias. Last A1c was near goal:  Lab Results  Component Value Date   HGBA1C 5.9 (H) 06/08/2019        Finally, patient has history of Vitamin D Deficiency("35" /2008)and last vitamin D was still very low:  Lab Results  Component Value Date   VD25OH 32 03/08/2019    Current Outpatient Medications on File Prior to Visit  Medication Sig  . CHOLECALCIFEROL PO Take 2,000 Units by mouth daily.  . finasteride (PROSCAR) 5 MG tablet Take 1 tablet Daily for Prostate  . Fluticasone-Umeclidin-Vilant (TRELEGY ELLIPTA) 100-62.5-25 MCG/INH AEPB Inhale 100 mcg into the lungs daily.  Marland Kitchen losartan (COZAAR) 100 MG tablet Take 1 tablet Daily for BP  . meloxicam (MOBIC) 15 MG tablet Take 1/2 to 1 tablet Daily with Food for Pain & Inflammation  . Polyethyl Glycol-Propyl Glycol (SYSTANE OP) Place 1 drop into both eyes 3 (three) times daily as needed (Dry eyes).    No current  facility-administered medications on file prior to visit.   Allergies  Allergen Reactions  . Atenolol Other (See Comments)    "not sure"  . Pravastatin     Joint pain  . Simvastatin     Joint pain  . Sulfonamide Derivatives Swelling and Rash   Past Medical History:  Diagnosis Date  . Arthritis    oa  . Asthma    as child  . Bladder neck obstruction 08/02/2013  . Hyperlipidemia   . Hypertension   . Measles    as child  . Mumps    as child  . Pre-diabetes    Health Maintenance  Topic Date Due  . INFLUENZA VACCINE  04/07/2020  . TETANUS/TDAP  09/17/2029  . COVID-19 Vaccine  Completed  . PNA vac Low Risk Adult  Completed   Immunization History  Administered Date(s) Administered  . Influenza, High Dose Seasonal PF 06/03/2018, 05/25/2019  . PFIZER SARS-COV-2 Vaccination 11/12/2019, 12/12/2019  . Pneumococcal Conjugate-13 06/27/2014  . Pneumococcal Polysaccharide-23 09/07/2006  . Td 03/08/2019  . Tdap 09/18/2019  . Zoster 09/07/2008   Last Colon -  08/18/2019 - Dr Fuller Plan - Negative & Recc No  f/u due to age  Past Surgical History:  Procedure Laterality Date  . KNEE SURGERY Right 1972   cartlidge repair  . left knee arthroscopy  01-14-2012  . TOTAL KNEE ARTHROPLASTY Left 11/04/2015   Procedure: LEFT TOTAL KNEE ARTHROPLASTY;  Surgeon: Gaynelle Arabian, MD;  Location: [facility];  Service: Orthopedics;  Laterality: Left;  . TOTAL KNEE ARTHROPLASTY Right 10/11/2017   Procedure: RIGHT TOTAL KNEE ARTHROPLASTY;  Surgeon: Gaynelle Arabian, MD;  Location: [facility];  Service: Orthopedics;  Laterality: Right;   Family History  Problem Relation Age of Onset  . Colon cancer Neg Hx   . Esophageal cancer Neg Hx   . Liver cancer Neg Hx   . Stomach cancer Neg Hx    Social History   Socioeconomic History  . Marital status: Married    Education administrator   . Number of children: 1 son  Occupational History  . Retired [employer] - Retired [employer]  Tobacco Use  . Smoking status:  Never Smoker  . Smokeless tobacco: Never Used  Vaping Use  . Vaping Use: Never used  Substance and Sexual Activity  . Alcohol use: No  . Drug use: No  . Sexual activity: Not on file     ROS Constitutional: Denies fever, chills, weight loss/gain, headaches, insomnia,  night sweats or change in appetite. Does c/o fatigue. Eyes: Denies redness, blurred vision, diplopia, discharge, itchy or watery eyes.  ENT: Denies discharge, congestion, post nasal drip, epistaxis, sore throat, earache, hearing loss, dental pain, Tinnitus, Vertigo, Sinus pain or snoring.  Cardio: Denies chest pain, palpitations, irregular heartbeat, syncope, dyspnea, diaphoresis, orthopnea, PND, claudication or edema Respiratory: denies cough, dyspnea, DOE, pleurisy, hoarseness, laryngitis or wheezing.  Gastrointestinal: Denies dysphagia, heartburn, reflux, water brash, pain, cramps, nausea, vomiting, bloating, diarrhea, constipation, hematemesis, melena, hematochezia, jaundice or hemorrhoids Genitourinary: Denies dysuria, frequency, urgency, nocturia, hesitancy, discharge, hematuria or flank pain Musculoskeletal: Denies arthralgia, myalgia, stiffness, Jt. Swelling, pain, limp or strain/sprain. Denies Falls. Skin: Denies puritis, rash, hives, warts, acne, eczema or change in skin lesion Neuro: No weakness, tremor, incoordination, spasms, paresthesia or pain Psychiatric: Denies confusion, memory loss or sensory loss. Denies Depression. Endocrine: Denies change in weight, skin, hair change, nocturia, and paresthesia, diabetic polys, visual blurring or hyper / hypo glycemic episodes.  Heme/Lymph: No excessive bleeding, bruising or enlarged lymph nodes.  Physical Exam  BP 116/80   Pulse 81   Temp (!) 96.9 F (36.1 C)   Resp 16   Ht 5\' 9"  (1.753 m)   Wt 178 lb 3.2 oz (80.8 kg)   BMI 26.32 kg/m   General Appearance: Well nourished and well groomed and in no apparent distress.  Eyes: PERRLA, EOMs, conjunctiva no swelling  or erythema, normal fundi and vessels. Sinuses: No frontal/maxillary tenderness ENT/Mouth: EACs patent / TMs  nl. Nares clear without erythema, swelling, mucoid exudates. Oral hygiene is good. No erythema, swelling, or exudate. Tongue normal, non-obstructing. Tonsils not swollen or erythematous. Hearing normal.  Neck: Supple, thyroid not palpable. No bruits, nodes or JVD. Respiratory: Respiratory effort normal.  BS equal and clear bilateral without rales, rhonci, wheezing or stridor. Cardio: Heart sounds are normal with regular rate and rhythm and no murmurs, rubs or gallops. Peripheral pulses are normal and equal bilaterally without edema. No aortic or femoral bruits. Chest: symmetric with normal excursions and percussion.  Abdomen: Soft, with Nl bowel sounds. Nontender, no guarding, rebound, hernias, masses, or organomegaly.  Lymphatics: Non tender without lymphadenopathy.  Musculoskeletal: Full ROM all peripheral extremities, joint stability, 5/5 strength, and normal gait. Skin: Warm and dry without rashes, lesions, cyanosis, clubbing or  ecchymosis.  Neuro: Cranial nerves intact, reflexes equal bilaterally. Normal muscle tone, no cerebellar symptoms. Sensation intact.  Pysch: Alert and oriented X 3 with normal affect, insight and judgment appropriate.  Assessment and Plan  1. Annual Preventative/Screening Exam    2. Essential hypertension  - EKG 12-Lead - [place], RETROPERITNL ABD,  LTD - Urinalysis, Routine w reflex microscopic - Microalbumin / creatinine urine ratio - CBC with Differential/Platelet - COMPLETE METABOLIC PANEL WITH GFR - Magnesium - TSH  3. Hyperlipidemia, mixed  - EKG 12-Lead - [place], RETROPERITNL ABD,  LTD - Lipid panel - TSH  4. Abnormal glucose  - EKG 12-Lead - [place], RETROPERITNL ABD,  LTD - Hemoglobin A1c - Insulin, random  5. Vitamin D deficiency  - VITAMIN D 25 Hydroxyl  6. Aortic atherosclerosis (HCC)  - EKG 12-Lead - [place], RETROPERITNL ABD,   LTD  7. BPH with obstruction/lower urinary tract symptoms  - PSA  8. Prostate cancer screening  - PSA  9. Screening for ischemic heart disease  - EKG 12-Lead  10. Screening for AAA (aortic abdominal aneurysm)  - [place], RETROPERITNL ABD,  LTD  11. Medication management  - Urinalysis, Routine w reflex microscopic - Microalbumin / creatinine urine ratio - CBC with Differential/Platelet - COMPLETE METABOLIC PANEL WITH GFR - Magnesium - Lipid panel - TSH - Hemoglobin A1c - Insulin, random - VITAMIN D 25 Hydroxy   12. Screening for colorectal cancer  - POC Hemoccult Bld/Stl         Patient was counseled in prudent diet, weight control to achieve/maintain BMI less than 25, BP monitoring, regular exercise and medications as discussed.  Given new Rx for Singulair to hopefully help with his presumed allergic cough.  Discussed med effects and SE's. Routine screening labs and tests as requested with regular follow-up as recommended. Over 40 minutes of exam, counseling, chart review and high complex critical decision making was performed   Kirtland Bouchard, MD

## 2020-03-19 NOTE — Patient Instructions (Signed)

## 2020-03-20 ENCOUNTER — Other Ambulatory Visit: Payer: Self-pay

## 2020-03-20 ENCOUNTER — Ambulatory Visit (INDEPENDENT_AMBULATORY_CARE_PROVIDER_SITE_OTHER): Payer: Medicare Other | Admitting: Internal Medicine

## 2020-03-20 VITALS — BP 116/80 | HR 81 | Temp 96.9°F | Resp 16 | Ht 69.0 in | Wt 178.2 lb

## 2020-03-20 DIAGNOSIS — Z1211 Encounter for screening for malignant neoplasm of colon: Secondary | ICD-10-CM

## 2020-03-20 DIAGNOSIS — N401 Enlarged prostate with lower urinary tract symptoms: Secondary | ICD-10-CM

## 2020-03-20 DIAGNOSIS — Z0000 Encounter for general adult medical examination without abnormal findings: Secondary | ICD-10-CM

## 2020-03-20 DIAGNOSIS — Z136 Encounter for screening for cardiovascular disorders: Secondary | ICD-10-CM

## 2020-03-20 DIAGNOSIS — I10 Essential (primary) hypertension: Secondary | ICD-10-CM

## 2020-03-20 DIAGNOSIS — R7309 Other abnormal glucose: Secondary | ICD-10-CM

## 2020-03-20 DIAGNOSIS — E559 Vitamin D deficiency, unspecified: Secondary | ICD-10-CM | POA: Diagnosis not present

## 2020-03-20 DIAGNOSIS — E782 Mixed hyperlipidemia: Secondary | ICD-10-CM | POA: Diagnosis not present

## 2020-03-20 DIAGNOSIS — Z125 Encounter for screening for malignant neoplasm of prostate: Secondary | ICD-10-CM

## 2020-03-20 DIAGNOSIS — Z79899 Other long term (current) drug therapy: Secondary | ICD-10-CM

## 2020-03-20 DIAGNOSIS — Z0001 Encounter for general adult medical examination with abnormal findings: Secondary | ICD-10-CM

## 2020-03-20 DIAGNOSIS — I700 Atherosclerosis of aorta: Secondary | ICD-10-CM

## 2020-03-20 DIAGNOSIS — J4520 Mild intermittent asthma, uncomplicated: Secondary | ICD-10-CM

## 2020-03-20 MED ORDER — MONTELUKAST SODIUM 10 MG PO TABS: ORAL_TABLET | ORAL | 3 refills | Status: DC

## 2020-03-21 LAB — COMPLETE METABOLIC PANEL WITH GFR
AG Ratio: 2.2 (calc) (ref 1.0–2.5)
ALT: 20 U/L (ref 9–46)
AST: 15 U/L (ref 10–35)
Albumin: 4.2 g/dL (ref 3.6–5.1)
Alkaline phosphatase (APISO): 70 U/L (ref 35–144)
BUN: 21 mg/dL (ref 7–25)
CO2: 24 mmol/L (ref 20–32)
Calcium: 8.8 mg/dL (ref 8.6–10.3)
Chloride: 108 mmol/L (ref 98–110)
Creat: 0.96 mg/dL (ref 0.70–1.11)
GFR, Est African American: 85 mL/min/{1.73_m2} (ref >=60)
GFR, Est Non African American: 73 mL/min/{1.73_m2} (ref >=60)
Globulin: 1.9 g/dL (calc) (ref 1.9–3.7)
Glucose, Bld: 97 mg/dL (ref 65–99)
Potassium: 4.3 mmol/L (ref 3.5–5.3)
Sodium: 141 mmol/L (ref 135–146)
Total Bilirubin: 0.5 mg/dL (ref 0.2–1.2)
Total Protein: 6.1 g/dL (ref 6.1–8.1)

## 2020-03-21 LAB — CBC WITH DIFFERENTIAL/PLATELET
Absolute Monocytes: 774 cells/uL (ref 200–950)
Basophils Absolute: 59 cells/uL (ref 0–200)
Basophils Relative: 0.9 %
Eosinophils Absolute: 189 cells/uL (ref 15–500)
Eosinophils Relative: 2.9 %
HCT: 41.4 % (ref 38.5–50.0)
Hemoglobin: 13.8 g/dL (ref 13.2–17.1)
Lymphs Abs: 1099 cells/uL (ref 850–3900)
MCH: 30.7 pg (ref 27.0–33.0)
MCHC: 33.3 g/dL (ref 32.0–36.0)
MCV: 92.2 fL (ref 80.0–100.0)
MPV: 9.1 fL (ref 7.5–12.5)
Monocytes Relative: 11.9 %
Neutro Abs: 4381 cells/uL (ref 1500–7800)
Neutrophils Relative %: 67.4 %
Platelets: 222 10*3/uL (ref 140–400)
RBC: 4.49 10*6/uL (ref 4.20–5.80)
RDW: 13 % (ref 11.0–15.0)
Total Lymphocyte: 16.9 %
WBC: 6.5 10*3/uL (ref 3.8–10.8)

## 2020-03-21 LAB — URINALYSIS, ROUTINE W REFLEX MICROSCOPIC
Bilirubin Urine: NEGATIVE
Glucose, UA: NEGATIVE
Hgb urine dipstick: NEGATIVE
Ketones, ur: NEGATIVE
Leukocytes,Ua: NEGATIVE
Nitrite: NEGATIVE
Protein, ur: NEGATIVE
Specific Gravity, Urine: 1.018 (ref 1.001–1.03)
pH: <=5 (ref 5.0–8.0)

## 2020-03-21 LAB — INSULIN, RANDOM: Insulin: 9.5 u[IU]/mL

## 2020-03-21 LAB — LIPID PANEL
Cholesterol: 149 mg/dL (ref <200)
HDL: 51 mg/dL (ref >=40)
LDL Cholesterol (Calc): 82 mg/dL (calc)
Non-HDL Cholesterol (Calc): 98 mg/dL (calc) (ref <130)
Total CHOL/HDL Ratio: 2.9 (calc) (ref <5.0)
Triglycerides: 84 mg/dL (ref <150)

## 2020-03-21 LAB — TSH: TSH: 1.96 mIU/L (ref 0.40–4.50)

## 2020-03-21 LAB — MICROALBUMIN / CREATININE URINE RATIO
Creatinine, Urine: 106 mg/dL (ref 20–320)
Microalb Creat Ratio: 19 mcg/mg creat (ref <30)
Microalb, Ur: 2 mg/dL

## 2020-03-21 LAB — HEMOGLOBIN A1C
Hgb A1c MFr Bld: 5.9 % of total Hgb — ABNORMAL HIGH (ref <5.7)
Mean Plasma Glucose: 123 (calc)
eAG (mmol/L): 6.8 (calc)

## 2020-03-21 LAB — PSA: PSA: 0.2 ng/mL (ref <=4.0)

## 2020-03-21 LAB — VITAMIN D 25 HYDROXY (VIT D DEFICIENCY, FRACTURES): Vit D, 25-Hydroxy: 64 ng/mL (ref 30–100)

## 2020-03-21 LAB — MAGNESIUM: Magnesium: 2 mg/dL (ref 1.5–2.5)

## 2020-03-22 NOTE — Progress Notes (Signed)
==========================================================  -   PSA -very Low - Great  ==========================================================  -  Total Chol = 149 and LDL Chol = 82 - Both  Excellent   - Very low risk for Heart Attack  / Stroke =============================================================  - A1c = 5/9 % - OK   - Vitamin D = 64 - Excellent  ==========================================================  -  All Else - CBC - Electrolytes - Liver - Magnesium & Thyroid    - all  Normal / OK ==========================================================

## 2020-03-27 DIAGNOSIS — M25642 Stiffness of left hand, not elsewhere classified: Secondary | ICD-10-CM | POA: Diagnosis not present

## 2020-06-19 ENCOUNTER — Other Ambulatory Visit: Payer: Self-pay | Admitting: Physician Assistant

## 2020-06-19 DIAGNOSIS — J439 Emphysema, unspecified: Secondary | ICD-10-CM

## 2020-06-20 ENCOUNTER — Encounter: Payer: Self-pay | Admitting: Adult Health

## 2020-06-20 ENCOUNTER — Other Ambulatory Visit: Payer: Self-pay

## 2020-06-20 ENCOUNTER — Ambulatory Visit (INDEPENDENT_AMBULATORY_CARE_PROVIDER_SITE_OTHER): Payer: Medicare Other | Admitting: Adult Health

## 2020-06-20 VITALS — BP 126/78 | HR 88 | Temp 97.0°F | Wt 177.0 lb

## 2020-06-20 DIAGNOSIS — J439 Emphysema, unspecified: Secondary | ICD-10-CM

## 2020-06-20 DIAGNOSIS — R7309 Other abnormal glucose: Secondary | ICD-10-CM | POA: Diagnosis not present

## 2020-06-20 DIAGNOSIS — R918 Other nonspecific abnormal finding of lung field: Secondary | ICD-10-CM

## 2020-06-20 DIAGNOSIS — I10 Essential (primary) hypertension: Secondary | ICD-10-CM | POA: Diagnosis not present

## 2020-06-20 DIAGNOSIS — E782 Mixed hyperlipidemia: Secondary | ICD-10-CM

## 2020-06-20 DIAGNOSIS — Z79899 Other long term (current) drug therapy: Secondary | ICD-10-CM | POA: Diagnosis not present

## 2020-06-20 DIAGNOSIS — E559 Vitamin D deficiency, unspecified: Secondary | ICD-10-CM | POA: Diagnosis not present

## 2020-06-20 DIAGNOSIS — I700 Atherosclerosis of aorta: Secondary | ICD-10-CM

## 2020-06-20 NOTE — Progress Notes (Signed)
3 MONTH FOLLOW UP Assessment:     Aortic atherosclerosis ([place]) Via CXR 10/2019 Control blood pressure, cholesterol, glucose, increase exercise.   Essential hypertension - continue medications, DASH diet, exercise and monitor at home. Call if greater than 130/80.   Hyperlipidemia, mixed decrease fatty foods increase activity.   Pulmonary emphysema, unspecified emphysema type ([place]) Per CXR; Non smoker; improved with trellegy ellipta - given trellegy and breztri samples due to donut whole; will monitor  Pulmonary nodule Follow up due Oct 2021-April 2022 Ct Chest ordered  Abnormal glucose Discussed disease progression and risks Discussed diet/exercise, weight management and risk modification  Vitamin D deficiency Continue supplement  Medication management Monitor  Future Appointments  Date Time Provider [place]  09/25/2020  2:30 PM Unk Pinto, MD [facility] None  03/25/2021 10:00 AM Unk Pinto, MD [facility] None     Subjective:  Carlos Baldwin is a 85 y.o. male who presents for 3 month follow up for HTN, hyperlipidemia, prediabetes, and vitamin D Def. He had + cologuard in 2020, underwent colonoscopy/polpectomy by Dr. Fuller Plan, recommended no follow up due to age.   Persistent cough, has emphysematous changes per CXR; never smoker, remote hx of asthma as a child Now on singulair, trellegy with excellent results, but currently in the donut whole  had CT CT chest 11/06/2019 Noncalcified 7 mm pulmonary nodule within the right middle lobe. 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 He is watching portions. Wt Readings from Last 3 Encounters:  06/20/20 177 lb (80.3 kg)  03/20/20 178 lb 3.2 oz (80.8 kg)  11/15/19 178 lb (80.7 kg)   He has aortic atherosclerosis per CT  11/2019 His blood pressure has been controlled at home, today their BP is BP: 126/78 He does workout, he rides bike at home, has retired from police work.   He denies chest pain, shortness of breath, dizziness.   He is not on cholesterol medication and denies myalgias. His cholesterol is at goal. The cholesterol last visit was:   Lab Results  Component Value Date   CHOL 149 03/20/2020   HDL 51 03/20/2020   LDLCALC 82 03/20/2020   TRIG 84 03/20/2020   CHOLHDL 2.9 03/20/2020   He has been working on diet and exercise for prediabetes, and denies paresthesia of the feet, polydipsia and polyuria. Last A1C in the office was:   Lab Results  Component Value Date   HGBA1C 5.9 (H) 03/20/2020   Patient is on Vitamin D supplement, was off Lab Results  Component Value Date   VD25OH 64 03/20/2020   Lab Results  Component Value Date   GFRNONAA 73 03/20/2020     Medication Review: Current Outpatient Medications on File Prior to Visit  Medication Sig Dispense Refill  . CHOLECALCIFEROL PO Take 2,000 Units by mouth daily.    . finasteride (PROSCAR) 5 MG tablet Take 1 tablet Daily for Prostate 90 tablet 3  . Fluticasone-Umeclidin-Vilant (TRELEGY ELLIPTA) 100-62.5-25 MCG/INH AEPB Use     1 inhalation     Daily 180 each 1  . losartan (COZAAR) 100 MG tablet Take 1 tablet Daily for BP 90 tablet 3  . meloxicam (MOBIC) 15 MG tablet Take 1/2 to 1 tablet Daily with Food for Pain & Inflammation 90 tablet 3  . montelukast (SINGULAIR) 10 MG tablet Take 1 tablet  daily for Allergies & Asthma 90 tablet 3  . Polyethyl Glycol-Propyl Glycol (SYSTANE OP) Place 1 drop into both eyes 3 (three) times daily as needed (Dry eyes).      No current facility-administered medications on file prior to visit.   Current Problems (verified) Patient Active Problem List   Diagnosis Date Noted  . Lung nodules 11/07/2019  . Aortic atherosclerosis ([place]) 10/26/2019  . COPD (chronic obstructive pulmonary disease) ([place])  10/26/2019  . BPH with obstruction/lower urinary tract symptoms 03/07/2019  . Abnormal glucose 03/07/2019  . Medication management 12/21/2013  . Essential hypertension 08/02/2013  . Hyperlipidemia, mixed 08/02/2013  . Vitamin D deficiency 08/02/2013     Allergies Allergies  Allergen Reactions  . Atenolol Other (See Comments)    "not sure"  . Pravastatin     Joint pain  . Simvastatin     Joint pain  . Sulfa Antibiotics   . Sulfonamide Derivatives Swelling and Rash    SURGICAL HISTORY He  has a past surgical history that includes Knee surgery (Right, 1972); left knee arthroscopy (01-14-2012); Total knee arthroplasty (Left, 11/04/2015); and Total knee arthroplasty (Right, 10/11/2017). FAMILY HISTORY His family history is not on file.  SOCIAL HISTORY He  reports that he has never smoked. He has never used smokeless tobacco. He reports that he does not drink alcohol and does not use drugs.   Review of Systems  Constitutional: Negative for malaise/fatigue and weight loss.  HENT: Negative for hearing loss and tinnitus.   Eyes: Negative for blurred vision and double vision.  Respiratory: Negative for cough, shortness of breath and wheezing.   Cardiovascular: Negative for chest pain, palpitations, orthopnea, claudication and leg swelling.  Gastrointestinal: Negative for abdominal pain, blood in stool, constipation, diarrhea, heartburn, melena, nausea and vomiting.  Genitourinary: Negative.   Musculoskeletal: Negative for joint pain and myalgias.  Skin: Negative for rash.  Neurological: Negative for dizziness, tingling, sensory change, weakness and headaches.  Endo/Heme/Allergies: Negative for polydipsia.  Psychiatric/Behavioral: Negative.   All other systems reviewed and are negative.    Objective:   Blood pressure 126/78, pulse 88, temperature (!) 97 F (36.1 C), weight 177 lb (80.3 kg), SpO2 94 %. Body mass index is 26.14 kg/m.  General appearance: alert, no distress,  WD/WN, male HEENT: normocephalic, sclerae anicteric, TMs pearly, nares patent, no discharge or erythema, pharynx normal Oral cavity: MMM, no lesions Neck: supple, no lymphadenopathy, no thyromegaly, no masses Heart: RRR, distant heart sounds, normal S1, S2, no murmurs Lungs: CTA bilaterally, slight barrel chest, no wheezes, rhonchi, or rales Abdomen: +bs, soft, non tender, non distended, no masses, no hepatomegaly, no splenomegaly Musculoskeletal: nontender, no swelling, no obvious deformity Extremities: no edema, no cyanosis, no clubbing Skin: left face and left posterior ear with erythematous nodule/plaques Pulses: 2+ symmetric, upper and lower extremities, normal cap refill Neurological: alert, oriented x 3, CN2-12 intact, strength normal upper extremities and lower extremities, sensation normal throughout, DTRs 2+ throughout, no cerebellar signs, gait antalgic due to knees Psychiatric: normal affect, behavior normal, pleasant    Izora Ribas, NP   06/20/2020

## 2020-06-20 NOTE — Patient Instructions (Addendum)
.  A great goal to work towards is aiming to get in a serving daily of some of the most nutritionally dense foods - G- BOMBS daily

## 2020-06-21 LAB — COMPLETE METABOLIC PANEL WITH GFR
AG Ratio: 2.1 (calc) (ref 1.0–2.5)
ALT: 22 U/L (ref 9–46)
AST: 20 U/L (ref 10–35)
Albumin: 4.4 g/dL (ref 3.6–5.1)
Alkaline phosphatase (APISO): 80 U/L (ref 35–144)
BUN/Creatinine Ratio: 19 (calc) (ref 6–22)
BUN: 22 mg/dL (ref 7–25)
CO2: 26 mmol/L (ref 20–32)
Calcium: 9.8 mg/dL (ref 8.6–10.3)
Chloride: 107 mmol/L (ref 98–110)
Creat: 1.15 mg/dL — ABNORMAL HIGH (ref 0.70–1.11)
GFR, Est African American: 68 mL/min/{1.73_m2} (ref >=60)
GFR, Est Non African American: 59 mL/min/{1.73_m2} — ABNORMAL LOW (ref >=60)
Globulin: 2.1 g/dL (calc) (ref 1.9–3.7)
Glucose, Bld: 95 mg/dL (ref 65–99)
Potassium: 4.6 mmol/L (ref 3.5–5.3)
Sodium: 143 mmol/L (ref 135–146)
Total Bilirubin: 0.6 mg/dL (ref 0.2–1.2)
Total Protein: 6.5 g/dL (ref 6.1–8.1)

## 2020-06-21 LAB — CBC WITH DIFFERENTIAL/PLATELET
Absolute Monocytes: 715 cells/uL (ref 200–950)
Basophils Absolute: 73 cells/uL (ref 0–200)
Basophils Relative: 1 %
Eosinophils Absolute: 117 cells/uL (ref 15–500)
Eosinophils Relative: 1.6 %
HCT: 42 % (ref 38.5–50.0)
Hemoglobin: 14.4 g/dL (ref 13.2–17.1)
Lymphs Abs: 1102 cells/uL (ref 850–3900)
MCH: 31.2 pg (ref 27.0–33.0)
MCHC: 34.3 g/dL (ref 32.0–36.0)
MCV: 91.1 fL (ref 80.0–100.0)
MPV: 9.4 fL (ref 7.5–12.5)
Monocytes Relative: 9.8 %
Neutro Abs: 5293 cells/uL (ref 1500–7800)
Neutrophils Relative %: 72.5 %
Platelets: 235 10*3/uL (ref 140–400)
RBC: 4.61 10*6/uL (ref 4.20–5.80)
RDW: 13.1 % (ref 11.0–15.0)
Total Lymphocyte: 15.1 %
WBC: 7.3 10*3/uL (ref 3.8–10.8)

## 2020-06-21 LAB — LIPID PANEL
Cholesterol: 168 mg/dL (ref <200)
HDL: 57 mg/dL (ref >=40)
LDL Cholesterol (Calc): 90 mg/dL (calc)
Non-HDL Cholesterol (Calc): 111 mg/dL (calc) (ref <130)
Total CHOL/HDL Ratio: 2.9 (calc) (ref <5.0)
Triglycerides: 111 mg/dL (ref <150)

## 2020-06-21 LAB — TSH: TSH: 2.07 mIU/L (ref 0.40–4.50)

## 2020-06-21 LAB — MAGNESIUM: Magnesium: 1.8 mg/dL (ref 1.5–2.5)

## 2020-07-10 ENCOUNTER — Ambulatory Visit: Payer: Medicare Other

## 2020-07-11 ENCOUNTER — Ambulatory Visit (INDEPENDENT_AMBULATORY_CARE_PROVIDER_SITE_OTHER): Payer: Medicare Other | Admitting: *Deleted

## 2020-07-11 ENCOUNTER — Other Ambulatory Visit: Payer: Self-pay

## 2020-07-11 ENCOUNTER — Ambulatory Visit
Admission: RE | Admit: 2020-07-11 | Discharge: 2020-07-11 | Disposition: A | Discharge status: Home / Self Care | Payer: Medicare Other | Source: Ambulatory Visit | Attending: Adult Health | Admitting: Adult Health

## 2020-07-11 ENCOUNTER — Other Ambulatory Visit: Payer: Medicare Other

## 2020-07-11 VITALS — BP 124/78 | HR 100 | Temp 97.2°F | Resp 16 | Wt 179.0 lb

## 2020-07-11 DIAGNOSIS — R918 Other nonspecific abnormal finding of lung field: Secondary | ICD-10-CM

## 2020-07-11 DIAGNOSIS — R911 Solitary pulmonary nodule: Secondary | ICD-10-CM | POA: Diagnosis not present

## 2020-07-11 DIAGNOSIS — I2510 Atherosclerotic heart disease of native coronary artery without angina pectoris: Secondary | ICD-10-CM | POA: Diagnosis not present

## 2020-07-11 DIAGNOSIS — I700 Atherosclerosis of aorta: Secondary | ICD-10-CM | POA: Diagnosis not present

## 2020-07-11 DIAGNOSIS — I10 Essential (primary) hypertension: Secondary | ICD-10-CM | POA: Diagnosis not present

## 2020-07-11 NOTE — Progress Notes (Signed)
Patient here for a NV and lab, due to reduced kidney functions. Patient states he does not always drink enough fluids daily. He has not taken any NSAIDS since his last labs, but states he does take an occasional Motrin. Patient was encouraged to increase fluids and avoid the Motrin.

## 2020-07-12 ENCOUNTER — Encounter: Payer: Self-pay | Admitting: Adult Health

## 2020-07-12 DIAGNOSIS — N182 Chronic kidney disease, stage 2 (mild): Secondary | ICD-10-CM | POA: Insufficient documentation

## 2020-07-12 DIAGNOSIS — N183 Chronic kidney disease, stage 3 unspecified: Secondary | ICD-10-CM | POA: Insufficient documentation

## 2020-07-12 LAB — BASIC METABOLIC PANEL WITH GFR
BUN/Creatinine Ratio: 19 (calc) (ref 6–22)
BUN: 22 mg/dL (ref 7–25)
CO2: 25 mmol/L (ref 20–32)
Calcium: 9.7 mg/dL (ref 8.6–10.3)
Chloride: 107 mmol/L (ref 98–110)
Creat: 1.15 mg/dL — ABNORMAL HIGH (ref 0.70–1.11)
GFR, Est African American: 68 mL/min/{1.73_m2} (ref >=60)
GFR, Est Non African American: 59 mL/min/{1.73_m2} — ABNORMAL LOW (ref >=60)
Glucose, Bld: 98 mg/dL (ref 65–99)
Potassium: 4.4 mmol/L (ref 3.5–5.3)
Sodium: 140 mmol/L (ref 135–146)

## 2020-09-10 ENCOUNTER — Other Ambulatory Visit: Payer: Self-pay | Admitting: Internal Medicine

## 2020-09-10 DIAGNOSIS — I10 Essential (primary) hypertension: Secondary | ICD-10-CM

## 2020-09-25 ENCOUNTER — Encounter: Payer: Self-pay | Admitting: Internal Medicine

## 2020-09-25 ENCOUNTER — Other Ambulatory Visit: Payer: Self-pay

## 2020-09-25 ENCOUNTER — Ambulatory Visit (INDEPENDENT_AMBULATORY_CARE_PROVIDER_SITE_OTHER): Payer: Medicare Other | Admitting: Internal Medicine

## 2020-09-25 VITALS — BP 126/84 | HR 94 | Temp 97.5°F | Ht 69.0 in | Wt 176.0 lb

## 2020-09-25 DIAGNOSIS — R7309 Other abnormal glucose: Secondary | ICD-10-CM | POA: Diagnosis not present

## 2020-09-25 DIAGNOSIS — Z79899 Other long term (current) drug therapy: Secondary | ICD-10-CM | POA: Diagnosis not present

## 2020-09-25 DIAGNOSIS — E782 Mixed hyperlipidemia: Secondary | ICD-10-CM

## 2020-09-25 DIAGNOSIS — I10 Essential (primary) hypertension: Secondary | ICD-10-CM

## 2020-09-25 DIAGNOSIS — E559 Vitamin D deficiency, unspecified: Secondary | ICD-10-CM | POA: Diagnosis not present

## 2020-09-25 MED ORDER — FLUOROURACIL 5 % EX CREA: TOPICAL_CREAM | CUTANEOUS | 1 refills | Status: DC

## 2020-09-25 NOTE — Addendum Note (Signed)
Addended by: Unk Pinto on: 09/25/2020 08:25 PM   Modules accepted: Orders

## 2020-09-25 NOTE — Patient Instructions (Signed)

## 2020-09-25 NOTE — Progress Notes (Signed)
History of Present Illness:       This very nice 86 y.o.   MWM presents for 6 month follow up with HTN, HLD, Pre-Diabetes and Vitamin D Deficiency. Patient was noted to have Aortic Atherosclerosis by Chest CT scan       Patient has a non healing superficial ulceration (~1 cm) on his zygoma area.        Patient is treated for HTN  (2003) & BP has been controlled at home. Today's BP is at goal - 126/84. Patient has had no complaints of any cardiac type chest pain, palpitations, dyspnea / orthopnea / PND, dizziness, claudication, or dependent edema.      Hyperlipidemia is controlled with diet & meds. Patient denies myalgias or other med SE's. Last Lipids were at goal:   Lab Results  Component Value Date   CHOL 168 06/20/2020   HDL 57 06/20/2020   LDLCALC 90 06/20/2020   TRIG 111 06/20/2020   CHOLHDL 2.9 06/20/2020    Also, the patient has history of PreDiabetes (A1c 6.1% /2011) and has had no symptoms of reactive hypoglycemia, diabetic polys, paresthesias or visual blurring.  Last A1c was  Not at goal:  Lab Results  Component Value Date   HGBA1C 5.9 (H) 03/20/2020       Further, the patient also has history of Vitamin D Deficiency ("35" /2008) and supplements vitamin D without any suspected side-effects. Last vitamin D was at goal:  Lab Results  Component Value Date   VD25OH 64 03/20/2020    Current Outpatient Medications on File Prior to Visit  Medication Sig  . CHOLECALCIFEROL  Take 2,000 Units daily.  . finasteride  5 MG tablet Take 1 tablet Daily   . TRELEGY ELLIPTA 100-62.5-25  Use     1 inhalation     Daily  . losartan  100 MG tablet TAKE 1 TABLET \\DAILY  \  . meloxicam  15 MG tablet Take 1/2 to 1 tablet Daily \  . montelukast  10 MG tablet Take 1 tablet daily for Allergies & Asthma  . SYSTANE Place 1 drop into both eyes 3 \\times  daily as needed (\     Allergies  Allergen Reactions  . Atenolol Other (See Comments)    "not sure"  . Pravastatin     Joint  pain  . Simvastatin     Joint pain  . Sulfa Antibiotics   . Sulfonamide Derivatives Swelling and Rash    PMHx:   Past Medical History:  Diagnosis Date  . Arthritis    oa  . Asthma    as child  . Bladder neck obstruction 08/02/2013  . Colon polyps   . Hyperlipidemia   . Hypertension   . Measles    as child  . Mumps    as child  . Positive colorectal cancer screening using Cologuard test 07/17/2019  . Pre-diabetes     Immunization History  Administered Date(s) Administered  . Influenza, High Dose Seasonal PF 06/03/2018, 05/25/2019  . Influenza-Unspecified 06/06/2020  . PFIZER(Purple Top)SARS-COV-2 Vaccination 11/12/2019, 12/12/2019  . Pneumococcal Conjugate-13 06/27/2014  . Pneumococcal Polysaccharide-23 09/07/2006  . Td 03/08/2019  . Tdap 09/18/2019  . Zoster 09/07/2008    Past Surgical History:  Procedure Laterality Date  . KNEE SURGERY Right 1972   cartlidge repair  . left knee arthroscopy  01-14-2012  . TOTAL KNEE ARTHROPLASTY Left 11/04/2015   Procedure: LEFT TOTAL KNEE ARTHROPLASTY;  Surgeon: Gaynelle Arabian, MD;  Location: [facility];  Service: Orthopedics;  Laterality: Left;  . TOTAL KNEE ARTHROPLASTY Right 10/11/2017   Procedure: RIGHT TOTAL KNEE ARTHROPLASTY;  Surgeon: Gaynelle Arabian, MD;  Location: [facility];  Service: Orthopedics;  Laterality: Right;    FHx:    Reviewed / unchanged  SHx:    Reviewed / unchanged   Systems Review:  Constitutional: Denies fever, chills, wt changes, headaches, insomnia, fatigue, night sweats, change in appetite. Eyes: Denies redness, blurred vision, diplopia, discharge, itchy, watery eyes.  ENT: Denies discharge, congestion, post nasal drip, epistaxis, sore throat, earache, hearing loss, dental pain, tinnitus, vertigo, sinus pain, snoring.  CV: Denies chest pain, palpitations, irregular heartbeat, syncope, dyspnea, diaphoresis, orthopnea, PND, claudication or edema. Respiratory: denies cough, dyspnea, DOE, pleurisy, hoarseness,  laryngitis, wheezing.  Gastrointestinal: Denies dysphagia, odynophagia, heartburn, reflux, water brash, abdominal pain or cramps, nausea, vomiting, bloating, diarrhea, constipation, hematemesis, melena, hematochezia  or hemorrhoids. Genitourinary: Denies dysuria, frequency, urgency, nocturia, hesitancy, discharge, hematuria or flank pain. Musculoskeletal: Denies arthralgias, myalgias, stiffness, jt. swelling, pain, limping or strain/sprain.  Skin: Denies pruritus, rash, hives, warts, acne, eczema or change in skin lesion(s). Neuro: No weakness, tremor, incoordination, spasms, paresthesia or pain. Psychiatric: Denies confusion, memory loss or sensory loss. Endo: Denies change in weight, skin or hair change.  Heme/Lymph: No excessive bleeding, bruising or enlarged lymph nodes.  Physical Exam  BP 126/84   Pulse 94   Temp (!) 97.5 F (36.4 C)   Wt 176 lb (79.8 kg)   SpO2 96%   BMI 25.99 kg/m   Appears  well nourished, well groomed  and in no distress.  Eyes: PERRLA, EOMs, conjunctiva no swelling or erythema. Sinuses: No frontal/maxillary tenderness ENT/Mouth: EAC's clear, TM's nl w/o erythema, bulging. Nares clear w/o erythema, swelling, exudates. Oropharynx clear without erythema or exudates. Oral hygiene is good. Tongue normal, non obstructing. Hearing intact.  Neck: Supple. Thyroid not palpable. Car 2+/2+ without bruits, nodes or JVD. Chest: Respirations nl with BS clear & equal w/o rales, rhonchi, wheezing or stridor.  Cor: Heart sounds normal w/ regular rate and rhythm without sig. murmurs, gallops, clicks or rubs. Peripheral pulses normal and equal  without edema.  Abdomen: Soft & bowel sounds normal. Non-tender w/o guarding, rebound, hernias, masses or organomegaly.  Lymphatics: Unremarkable.  Musculoskeletal: Full ROM all peripheral extremities, joint stability, 5/5 strength and normal gait.  Skin: Warm, dry without exposed rashes, lesions or ecchymosis apparent.  Neuro: Cranial  nerves intact, reflexes equal bilaterally. Sensory-motor testing grossly intact. Tendon reflexes grossly intact.  Pysch: Alert & oriented x 3.  Insight and judgement nl & appropriate. No ideations.  Assessment and Plan:  1. Essential hypertension  - Continue medication, monitor blood pressure at home.  - Continue DASH diet.  Reminder to go to the ER if any CP,  SOB, nausea, dizziness, severe HA, changes vision/speech.  - CBC with Differential/Platelet - COMPLETE METABOLIC PANEL WITH GFR - Magnesium - TSH  2. Hyperlipidemia, mixed  - Continue diet/meds, exercise,& lifestyle modifications.  - Continue monitor periodic cholesterol/liver & renal functions   - Lipid panel - TSH  3. Abnormal glucose  - Continue diet, exercise  - Lifestyle modifications.  - Monitor appropriate labs.  - Hemoglobin A1c - Insulin, random  4. Vitamin D deficiency  - Continue supplementation.  - VITAMIN D 25 Hydroxy   5. Medication management  - CBC with Differential/Platelet - COMPLETE METABOLIC PANEL WITH GFR - Magnesium - Lipid panel - TSH - Hemoglobin A1c - Insulin, random - VITAMIN D 25  Hydroxy        Discussed  regular exercise, BP monitoring, weight control to achieve/maintain BMI less than 25 and discussed med and SE's. Recommended labs to assess and monitor clinical status with further disposition pending results of labs.  I discussed the assessment and treatment plan with the patient. The patient was provided an opportunity to ask questions and all were answered. The patient agreed with the plan and demonstrated an understanding of the instructions.  I provided over 30 minutes of exam, counseling, chart review and  complex critical decision making.        The patient was advised to call back or seek an in-person evaluation if the symptoms worsen or if the condition fails to improve as anticipated.   Kirtland Bouchard, MD

## 2020-09-26 LAB — COMPLETE METABOLIC PANEL WITH GFR
AG Ratio: 1.8 (calc) (ref 1.0–2.5)
ALT: 17 U/L (ref 9–46)
AST: 17 U/L (ref 10–35)
Albumin: 4.1 g/dL (ref 3.6–5.1)
Alkaline phosphatase (APISO): 68 U/L (ref 35–144)
BUN: 20 mg/dL (ref 7–25)
CO2: 26 mmol/L (ref 20–32)
Calcium: 9.3 mg/dL (ref 8.6–10.3)
Chloride: 106 mmol/L (ref 98–110)
Creat: 0.96 mg/dL (ref 0.70–1.11)
GFR, Est African American: 84 mL/min/{1.73_m2} (ref >=60)
GFR, Est Non African American: 73 mL/min/{1.73_m2} (ref >=60)
Globulin: 2.3 g/dL (calc) (ref 1.9–3.7)
Glucose, Bld: 127 mg/dL — ABNORMAL HIGH (ref 65–99)
Potassium: 4.5 mmol/L (ref 3.5–5.3)
Sodium: 141 mmol/L (ref 135–146)
Total Bilirubin: 0.5 mg/dL (ref 0.2–1.2)
Total Protein: 6.4 g/dL (ref 6.1–8.1)

## 2020-09-26 LAB — CBC WITH DIFFERENTIAL/PLATELET
Absolute Monocytes: 590 cells/uL (ref 200–950)
Basophils Absolute: 50 cells/uL (ref 0–200)
Basophils Relative: 0.7 %
Eosinophils Absolute: 79 cells/uL (ref 15–500)
Eosinophils Relative: 1.1 %
HCT: 40.9 % (ref 38.5–50.0)
Hemoglobin: 14 g/dL (ref 13.2–17.1)
Lymphs Abs: 1253 cells/uL (ref 850–3900)
MCH: 30.8 pg (ref 27.0–33.0)
MCHC: 34.2 g/dL (ref 32.0–36.0)
MCV: 89.9 fL (ref 80.0–100.0)
MPV: 9.3 fL (ref 7.5–12.5)
Monocytes Relative: 8.2 %
Neutro Abs: 5227 cells/uL (ref 1500–7800)
Neutrophils Relative %: 72.6 %
Platelets: 264 10*3/uL (ref 140–400)
RBC: 4.55 10*6/uL (ref 4.20–5.80)
RDW: 12.9 % (ref 11.0–15.0)
Total Lymphocyte: 17.4 %
WBC: 7.2 10*3/uL (ref 3.8–10.8)

## 2020-09-26 LAB — HEMOGLOBIN A1C
Hgb A1c MFr Bld: 6 % of total Hgb — ABNORMAL HIGH (ref <5.7)
Mean Plasma Glucose: 126 mg/dL
eAG (mmol/L): 7 mmol/L

## 2020-09-26 LAB — VITAMIN D 25 HYDROXY (VIT D DEFICIENCY, FRACTURES): Vit D, 25-Hydroxy: 63 ng/mL (ref 30–100)

## 2020-09-26 LAB — INSULIN, RANDOM: Insulin: 25.7 u[IU]/mL — ABNORMAL HIGH

## 2020-09-26 LAB — LIPID PANEL
Cholesterol: 148 mg/dL (ref <200)
HDL: 52 mg/dL (ref >=40)
LDL Cholesterol (Calc): 80 mg/dL (calc)
Non-HDL Cholesterol (Calc): 96 mg/dL (calc) (ref <130)
Total CHOL/HDL Ratio: 2.8 (calc) (ref <5.0)
Triglycerides: 76 mg/dL (ref <150)

## 2020-09-26 LAB — MAGNESIUM: Magnesium: 1.8 mg/dL (ref 1.5–2.5)

## 2020-09-26 LAB — TSH: TSH: 1.29 mIU/L (ref 0.40–4.50)

## 2020-09-27 NOTE — Progress Notes (Signed)
========================================================== °-   Test results slightly outside the reference range are not unusual. If there is anything important, I will review this with you,  otherwise it is considered normal test values.  If you have further questions,  please do not hesitate to contact me at the office or via My Chart.  ========================================================== ==========================================================  -   Magnesium  -   1.8   -  very  low- goal is betw 2.0 - 2.5,   - So..............Marland Kitchen  Recommend that you take  Magnesium 500 mg tablet daily   - also important to eat lots of  leafy green vegetables   - spinach - Kale - collards - greens - okra - asparagus  - broccoli - quinoa - squash - almonds   - black, red, white beans  -  peas - green beans ==========================================================  -  Total  Chol = 148     and    LDL   80 -  Both . Excellent   - Very low risk for Heart Attack  / Stroke ========================================================  -  A1c = 6.0%  Blood sugar and A1c are STILL elevated in the borderline and  early or pre-diabetes range which has the same   300% increased risk for heart attack, stroke, cancer and   alzheimer- type vascular dementia as full blown diabetes.   But the good news is that diet, exercise with  weight loss can cure the early diabetes at this point.  - Being diabetic has a  300% increased risk for heart attack,  stroke, cancer, and alzheimer- type vascular dementia.  ========================================================== ==========================================================  - It is very important that you work harder with diet by  avoiding all foods that are white except chicken,   fish & calliflower.  - Avoid white rice  (brown & wild rice is OK),   - Avoid white potatoes  (sweet potatoes in moderation is OK),   White bread or wheat bread or  anything made out of   white flour like bagels, donuts, rolls, buns, biscuits, cakes,  - pastries, cookies, pizza crust, and pasta (made from  white flour & egg whites)   - vegetarian pasta or spinach or wheat pasta is OK.  - Multigrain breads like Arnold's, Pepperidge Farm or   multigrain sandwich thins or high fiber breads like   [place] bread or "Dave's Killer" breads that are  4 to 5 grams fiber per slice !  are best.    Diet, exercise and weight loss can reverse and cure  diabetes in the early stages.   ========================================================== ==========================================================  -  Vitamin D = 63 - Excellent  ========================================================== ==========================================================  -  All Else - CBC - Kidneys - Electrolytes - Liver - Magnesium & Thyroid    - all  Normal / OK ===========================================================  ===========================================================  - Keep up the [place] Work  ! ========================================================== ==========================================================

## 2020-09-28 ENCOUNTER — Other Ambulatory Visit: Payer: Self-pay | Admitting: Internal Medicine

## 2020-09-28 MED ORDER — MELOXICAM 15 MG PO TABS
ORAL_TABLET | ORAL | 0 refills | Status: DC
Start: 2020-09-28 — End: 2020-12-28

## 2020-10-02 DIAGNOSIS — H52203 Unspecified astigmatism, bilateral: Secondary | ICD-10-CM | POA: Diagnosis not present

## 2020-10-02 DIAGNOSIS — H2512 Age-related nuclear cataract, left eye: Secondary | ICD-10-CM | POA: Diagnosis not present

## 2020-10-02 DIAGNOSIS — H3500 Unspecified background retinopathy: Secondary | ICD-10-CM | POA: Diagnosis not present

## 2020-10-02 DIAGNOSIS — H11001 Unspecified pterygium of right eye: Secondary | ICD-10-CM | POA: Diagnosis not present

## 2020-10-04 ENCOUNTER — Other Ambulatory Visit: Payer: Self-pay

## 2020-10-04 DIAGNOSIS — J439 Emphysema, unspecified: Secondary | ICD-10-CM

## 2020-10-04 MED ORDER — TRELEGY ELLIPTA 100-62.5-25 MCG/INH IN AEPB: INHALATION_SPRAY | RESPIRATORY_TRACT | 1 refills | Status: DC

## 2020-10-12 ENCOUNTER — Other Ambulatory Visit: Payer: Self-pay | Admitting: Internal Medicine

## 2020-12-28 ENCOUNTER — Other Ambulatory Visit: Payer: Self-pay | Admitting: Internal Medicine

## 2021-03-24 ENCOUNTER — Encounter: Payer: Self-pay | Admitting: Internal Medicine

## 2021-03-24 NOTE — Patient Instructions (Signed)

## 2021-03-24 NOTE — Progress Notes (Addendum)
Annual  Screening/Preventative Visit  & Comprehensive Evaluation & Examination  Future Appointments  Date Time Provider [place]  03/25/2021 10:00 AM Unk Pinto, MD [facility] None  03/25/2022 - CPE  11:00 AM Unk Pinto, MD [facility] None            This very nice 86 y.o.  MWM presents for a Screening /Preventative Visit & comprehensive evaluation and management of multiple medical co-morbidities.  Patient has been followed for HTN, HLD, Prediabetes and Vitamin D Deficiency. Chest CTscan in Nov 2021 revealed  Aortic Atherosclerosis (I70.0). CXR on 10/25/2019 showed Hyperinflation consistent with COPD (J43.9)       HTN predates since  2003. Patient's BP has been controlled at home.  Today's BP is high normal  at goal - 140/90. Patient denies any cardiac symptoms as chest pain, palpitations, shortness of breath, dizziness or ankle swelling.       Patient's hyperlipidemia is controlled with diet and medications. Patient denies myalgias or other medication SE's. Last lipids were  at goal:  Lab Results  Component Value Date   CHOL 148 09/25/2020   HDL 52 09/25/2020   LDLCALC 80 09/25/2020   TRIG 76 09/25/2020   CHOLHDL 2.8 09/25/2020         Patient has hx/o prediabetes (A1c 6.1% /2011) and patient denies reactive hypoglycemic symptoms, visual blurring, diabetic polys or paresthesias. Last A1c was not at goal:   Lab Results  Component Value Date   HGBA1C 6.0 (H) 09/25/2020          Finally, patient has history of Vitamin D Deficiency ("35" /2008) and last vitamin D was at goal:   Lab Results  Component Value Date   VD25OH 63 09/25/2020     Current Outpatient Medications on File Prior to Visit  Medication Sig   CHOLECALCIFEROL PO Take 2,000 Units by mouth daily.   finasteride (PROSCAR) 5 MG tablet TAKE 1 TABLET BY MOUTH DAILY FOR PROSTATE   fluorouracil (EFUDEX) 5 % cream Apply to suspect areas 2 x / day til skin raw   TRELEGY ELLIPTA 100-62.5-25   Use 1 inhalation daily   losartan 100 MG tablet TAKE 1 TABLET BY MOUTH DAILY FOR BLOOD PRESSURE   meloxicam 15 MG tablet TAKE 1/2 TO 1 TABLET BY MOUTH DAILY WITH FOOD AS NEEDED FOR PAIN AND INFLAMMATION   montelukast 10 MG tablet Take 1 tablet daily for Allergies & Asthma   SYSTANE Place 1 drop into both eyes 3 times daily as needed.     Allergies  Allergen Reactions   Atenolol Other (See Comments)    "not sure"   Pravastatin     Joint pain   Simvastatin     Joint pain   Sulfa Antibiotics    Sulfonamide Derivatives Swelling and Rash     Past Medical History:  Diagnosis Date   Arthritis    oa   Asthma    as child   Bladder neck obstruction 08/02/2013   Colon polyps    Hyperlipidemia    Hypertension    Measles    as child   Mumps    as child   Positive colorectal cancer screening using Cologuard test 07/17/2019   Pre-diabetes      Health Maintenance  Topic Date Due   Zoster Vaccines- Shingrix (1 of 2) Never done   COVID-19 Vaccine (3 - Pfizer risk series) 01/09/2020   INFLUENZA VACCINE  04/07/2021   TETANUS/TDAP  09/17/2029   PNA vac Low  Risk Adult  Completed   HPV VACCINES  Aged Out     Immunization History  Administered Date(s) Administered   Influenza, High Dose Seasonal PF 06/03/2018, 05/25/2019   Influenza-Unspecified 06/06/2020   PFIZER(Purple Top)SARS-COV-2 Vaccination 11/12/2019, 12/12/2019   Pneumococcal Conjugate-13 06/27/2014   Pneumococcal Polysaccharide-23 09/07/2006   Td 03/08/2019   Tdap 09/18/2019   Zoster, Live 09/07/2008    Last Colon -  08/18/2019 - Dr Fuller Plan - Negative & Recc No  f/u due to age  Past Surgical History:  Procedure Laterality Date   KNEE SURGERY Right 1972   cartlidge repair   left knee arthroscopy  01-14-2012   TOTAL KNEE ARTHROPLASTY Left 11/04/2015   Procedure: LEFT TOTAL KNEE ARTHROPLASTY;  Surgeon: Gaynelle Arabian, MD;  Location: [facility];  Service: Orthopedics;  Laterality: Left;   TOTAL KNEE ARTHROPLASTY Right  10/11/2017   Procedure: RIGHT TOTAL KNEE ARTHROPLASTY;  Surgeon: Gaynelle Arabian, MD;  Location: [facility];  Service: Orthopedics;  Laterality: Right;     Family History  Problem Relation Age of Onset   Colon cancer Neg Hx    Esophageal cancer Neg Hx    Liver cancer Neg Hx    Stomach cancer Neg Hx     Social History   Socioeconomic History   Marital status: Married    Spouse name: Hilda Blades   Number of children: 1son   Occupational History   Not on file  Tobacco Use   Smoking status: Never   Smokeless tobacco: Never  Vaping Use   Vaping Use: Never used  Substance and Sexual Activity   Alcohol use: No   Drug use: No   Sexual activity: Not on file      ROS Constitutional: Denies fever, chills, weight loss/gain, headaches, insomnia,  night sweats or change in appetite. Does c/o fatigue. Eyes: Denies redness, blurred vision, diplopia, discharge, itchy or watery eyes.  ENT: Denies discharge, congestion, post nasal drip, epistaxis, sore throat, earache, hearing loss, dental pain, Tinnitus, Vertigo, Sinus pain or snoring.  Cardio: Denies chest pain, palpitations, irregular heartbeat, syncope, dyspnea, diaphoresis, orthopnea, PND, claudication or edema Respiratory: denies cough, dyspnea, DOE, pleurisy, hoarseness, laryngitis or wheezing.  Gastrointestinal: Denies dysphagia, heartburn, reflux, water brash, pain, cramps, nausea, vomiting, bloating, diarrhea, constipation, hematemesis, melena, hematochezia, jaundice or hemorrhoids Genitourinary: Denies dysuria, frequency, urgency, nocturia, hesitancy, discharge, hematuria or flank pain Musculoskeletal: Denies arthralgia, myalgia, stiffness, Jt. Swelling, pain, limp or strain/sprain. Denies Falls. Skin: Denies puritis, rash, hives, warts, acne, eczema or change in skin lesion Neuro: No weakness, tremor, incoordination, spasms, paresthesia or pain Psychiatric: Denies confusion, memory loss or sensory loss. Denies Depression. Endocrine: Denies  change in weight, skin, hair change, nocturia, and paresthesia, diabetic polys, visual blurring or hyper / hypo glycemic episodes.  Heme/Lymph: No excessive bleeding, bruising or enlarged lymph nodes.   Physical Exam  BP 140/90   Pulse 88   Temp (!) 97.1 F (36.2 C)   Resp 16   Ht 5\' 9"  (1.753 m)   Wt 179 lb 6.4 oz (81.4 kg)   SpO2 97%   BMI 26.49 kg/m   General Appearance: Well nourished and well groomed and in no apparent distress.  Eyes: PERRLA, EOMs, conjunctiva no swelling or erythema, normal fundi and vessels. Sinuses: No frontal/maxillary tenderness ENT/Mouth: EACs patent / TMs  nl. Nares clear without erythema, swelling, mucoid exudates. Oral hygiene is good. No erythema, swelling, or exudate. Tongue normal, non-obstructing. Tonsils not swollen or erythematous. Hearing normal.  Neck: Supple, thyroid not palpable. No  bruits, nodes or JVD. Respiratory: Respiratory effort normal.  BS equal and clear bilateral without rales, rhonci, wheezing or stridor. Cardio: Heart sounds are normal with regular rate and rhythm and no murmurs, rubs or gallops. Peripheral pulses are normal and equal bilaterally without edema. No aortic or femoral bruits. Chest: symmetric with normal excursions and percussion.  Abdomen: Soft, with Nl bowel sounds. Nontender, no guarding, rebound, hernias, masses, or organomegaly.  Lymphatics: Non tender without lymphadenopathy.  Musculoskeletal: Full ROM all peripheral extremities, joint stability, 5/5 strength, and normal gait. Skin: Warm and dry without rashes, lesions, cyanosis, clubbing or  ecchymosis.  Neuro: Cranial nerves intact, reflexes equal bilaterally. Normal muscle tone, no cerebellar symptoms. Sensation intact.  Pysch: Alert and oriented X 3 with normal affect, insight and judgment appropriate.   Assessment and Plan  1. Annual Preventative/Screening Exam   2. Essential hypertension  - EKG 12-Lead - [place], RETROPERITNL ABD,  LTD - Urinalysis,  Routine w reflex microscopic - Microalbumin / creatinine urine ratio - CBC with Differential/Platelet - COMPLETE METABOLIC PANEL WITH GFR - Magnesium - TSH  3. Hyperlipidemia, mixed  - EKG 12-Lead - [place], RETROPERITNL ABD,  LTD - Lipid panel - Hemoglobin A1c - Insulin, random  4. Abnormal glucose  - EKG 12-Lead - [place], RETROPERITNL ABD,  LTD - Hemoglobin A1c - Insulin, random  5. Vitamin D deficiency  - VITAMIN D 25 Hydroxy   6. Aortic atherosclerosis ([place]) by Chest CT scan Nov 2021  - EKG 12-Lead - Lipid panel  7. BPH with obstruction/lower urinary tract symptoms  - PSA  8. Prostate cancer screening  - PSA  9. Screening for colorectal cancer  - POC Hemoccult Bld/Stl  10. Screening for ischemic heart disease  - EKG 12-Lead  11. Screening for AAA (aortic abdominal aneurysm)  - [place], RETROPERITNL ABD,  LTD  12. Medication management  - Urinalysis, Routine w reflex microscopic - Microalbumin / creatinine urine ratio - Magnesium - Lipid panel - TSH - Hemoglobin A1c - Insulin, random - VITAMIN D 25 Hydroxy          Patient was counseled in prudent diet, weight control to achieve/maintain BMI less than 25, BP monitoring, regular exercise and medications as discussed.  Discussed med effects and SE's. Routine screening labs and tests as requested with regular follow-up as recommended. Over 40 minutes of exam, counseling, chart review and high complex critical decision making was performed   Kirtland Bouchard, MD

## 2021-03-25 ENCOUNTER — Ambulatory Visit (INDEPENDENT_AMBULATORY_CARE_PROVIDER_SITE_OTHER): Payer: Medicare Other | Admitting: Internal Medicine

## 2021-03-25 ENCOUNTER — Other Ambulatory Visit: Payer: Self-pay

## 2021-03-25 ENCOUNTER — Encounter: Payer: Self-pay | Admitting: Internal Medicine

## 2021-03-25 VITALS — BP 140/90 | HR 88 | Temp 97.1°F | Resp 16 | Ht 69.0 in | Wt 179.4 lb

## 2021-03-25 DIAGNOSIS — Z0000 Encounter for general adult medical examination without abnormal findings: Secondary | ICD-10-CM

## 2021-03-25 DIAGNOSIS — E782 Mixed hyperlipidemia: Secondary | ICD-10-CM | POA: Diagnosis not present

## 2021-03-25 DIAGNOSIS — N138 Other obstructive and reflux uropathy: Secondary | ICD-10-CM

## 2021-03-25 DIAGNOSIS — Z125 Encounter for screening for malignant neoplasm of prostate: Secondary | ICD-10-CM

## 2021-03-25 DIAGNOSIS — I700 Atherosclerosis of aorta: Secondary | ICD-10-CM | POA: Diagnosis not present

## 2021-03-25 DIAGNOSIS — R7309 Other abnormal glucose: Secondary | ICD-10-CM

## 2021-03-25 DIAGNOSIS — I10 Essential (primary) hypertension: Secondary | ICD-10-CM

## 2021-03-25 DIAGNOSIS — Z136 Encounter for screening for cardiovascular disorders: Secondary | ICD-10-CM | POA: Diagnosis not present

## 2021-03-25 DIAGNOSIS — E559 Vitamin D deficiency, unspecified: Secondary | ICD-10-CM | POA: Diagnosis not present

## 2021-03-25 DIAGNOSIS — Z0001 Encounter for general adult medical examination with abnormal findings: Secondary | ICD-10-CM

## 2021-03-25 DIAGNOSIS — Z79899 Other long term (current) drug therapy: Secondary | ICD-10-CM

## 2021-03-25 DIAGNOSIS — J439 Emphysema, unspecified: Secondary | ICD-10-CM

## 2021-03-25 DIAGNOSIS — Z1211 Encounter for screening for malignant neoplasm of colon: Secondary | ICD-10-CM

## 2021-03-26 LAB — COMPLETE METABOLIC PANEL WITH GFR
AG Ratio: 2.1 (calc) (ref 1.0–2.5)
ALT: 15 U/L (ref 9–46)
AST: 16 U/L (ref 10–35)
Albumin: 4.1 g/dL (ref 3.6–5.1)
Alkaline phosphatase (APISO): 68 U/L (ref 35–144)
BUN: 19 mg/dL (ref 7–25)
CO2: 26 mmol/L (ref 20–32)
Calcium: 8.7 mg/dL (ref 8.6–10.3)
Chloride: 109 mmol/L (ref 98–110)
Creat: 0.96 mg/dL (ref 0.70–1.22)
Globulin: 2 g/dL (calc) (ref 1.9–3.7)
Glucose, Bld: 107 mg/dL — ABNORMAL HIGH (ref 65–99)
Potassium: 3.8 mmol/L (ref 3.5–5.3)
Sodium: 142 mmol/L (ref 135–146)
Total Bilirubin: 0.4 mg/dL (ref 0.2–1.2)
Total Protein: 6.1 g/dL (ref 6.1–8.1)
eGFR: 78 mL/min/{1.73_m2} (ref >=60)

## 2021-03-26 LAB — HEMOGLOBIN A1C
Hgb A1c MFr Bld: 5.8 % of total Hgb — ABNORMAL HIGH (ref <5.7)
Mean Plasma Glucose: 120 mg/dL
eAG (mmol/L): 6.6 mmol/L

## 2021-03-26 LAB — INSULIN, RANDOM: Insulin: 13 u[IU]/mL

## 2021-03-26 LAB — URINALYSIS, ROUTINE W REFLEX MICROSCOPIC
Bilirubin Urine: NEGATIVE
Glucose, UA: NEGATIVE
Hgb urine dipstick: NEGATIVE
Ketones, ur: NEGATIVE
Leukocytes,Ua: NEGATIVE
Nitrite: NEGATIVE
Protein, ur: NEGATIVE
Specific Gravity, Urine: 1.022 (ref 1.001–1.035)
pH: <=5 (ref 5.0–8.0)

## 2021-03-26 LAB — CBC WITH DIFFERENTIAL/PLATELET
Absolute Monocytes: 693 cells/uL (ref 200–950)
Basophils Absolute: 38 cells/uL (ref 0–200)
Basophils Relative: 0.6 %
Eosinophils Absolute: 132 cells/uL (ref 15–500)
Eosinophils Relative: 2.1 %
HCT: 39 % (ref 38.5–50.0)
Hemoglobin: 13.3 g/dL (ref 13.2–17.1)
Lymphs Abs: 1499 cells/uL (ref 850–3900)
MCH: 31.1 pg (ref 27.0–33.0)
MCHC: 34.1 g/dL (ref 32.0–36.0)
MCV: 91.1 fL (ref 80.0–100.0)
MPV: 9.1 fL (ref 7.5–12.5)
Monocytes Relative: 11 %
Neutro Abs: 3938 cells/uL (ref 1500–7800)
Neutrophils Relative %: 62.5 %
Platelets: 229 10*3/uL (ref 140–400)
RBC: 4.28 10*6/uL (ref 4.20–5.80)
RDW: 13.1 % (ref 11.0–15.0)
Total Lymphocyte: 23.8 %
WBC: 6.3 10*3/uL (ref 3.8–10.8)

## 2021-03-26 LAB — LIPID PANEL
Cholesterol: 140 mg/dL (ref <200)
HDL: 57 mg/dL (ref >=40)
LDL Cholesterol (Calc): 70 mg/dL (calc)
Non-HDL Cholesterol (Calc): 83 mg/dL (calc) (ref <130)
Total CHOL/HDL Ratio: 2.5 (calc) (ref <5.0)
Triglycerides: 46 mg/dL (ref <150)

## 2021-03-26 LAB — MICROALBUMIN / CREATININE URINE RATIO
Creatinine, Urine: 175 mg/dL (ref 20–320)
Microalb Creat Ratio: 11 mcg/mg creat (ref <30)
Microalb, Ur: 1.9 mg/dL

## 2021-03-26 LAB — VITAMIN D 25 HYDROXY (VIT D DEFICIENCY, FRACTURES): Vit D, 25-Hydroxy: 50 ng/mL (ref 30–100)

## 2021-03-26 LAB — PSA: PSA: 0.3 ng/mL (ref <=4.00)

## 2021-03-26 LAB — MAGNESIUM: Magnesium: 1.9 mg/dL (ref 1.5–2.5)

## 2021-03-26 LAB — TSH: TSH: 1.5 mIU/L (ref 0.40–4.50)

## 2021-03-26 NOTE — Progress Notes (Signed)
============================================================ -   Test results slightly outside the reference range are not unusual. If there is anything important, I will review this with you,  otherwise it is considered normal test values.  If you have further questions,  please do not hesitate to contact me at the office or via My Chart.  ============================================================ ============================================================  -  PSA - very  Low - Great  ============================================================ ============================================================  -  Total Chol = 140 and Bad /LDL Chol = 70 - Both  Excellent   - Very low risk for Heart Attack  / Stroke ============================================================ ============================================================  -  A1c = 5.8% - is about the same   -  Still   elevated in the borderline and  early or pre-diabetes range which has the same   300% increased risk for heart attack, stroke, cancer and   alzheimer- type vascular dementia as full blown diabetes.   But the good news is that diet, exercise with  weight loss can cure the early diabetes at this point. ============================================================ ============================================================  -  It is very important that you work harder with diet by  avoiding all foods that are white except chicken,   fish & calliflower.  - Avoid white rice  (brown & wild rice is OK),   - Avoid white potatoes  (sweet potatoes in moderation is OK),   White bread or wheat bread or anything made out of   white flour like bagels, donuts, rolls, buns, biscuits, cakes,  - pastries, cookies, pizza crust, and pasta (made from  white flour & egg whites)   - vegetarian pasta or spinach or wheat pasta is OK.  - Multigrain breads like Arnold's, Pepperidge Farm or   multigrain sandwich thins  or high fiber breads like   [place] bread or "Dave's Killer" breads that are  4 to 5 grams fiber per slice !  are best.    Diet, exercise and weight loss can reverse and cure  diabetes in the early stages.   ============================================================ ============================================================  -  Vitamin D = 50    -  Vitamin D goal is between 70-100.   - Please INCREASE  your Vitamin D to 5,000 units /day   - It is very important as a natural anti-inflammatory and helping the  immune system protect against viral infections, like the Covid-19    helping hair, skin, and nails, as well as reducing stroke and  heart attack risk.   - It helps your bones and helps with mood.  - It also decreases numerous cancer risks so please  take it as directed.   - Low Vit D is associated with a 200-300% higher risk for  CANCER   and 200-300% higher risk for HEART   ATTACK  &  STROKE.    - It is also associated with higher death rate at younger ages,   autoimmune diseases like Rheumatoid arthritis, Lupus,  Multiple Sclerosis.     - Also many other serious conditions, like depression, Alzheimer's  Dementia, infertility, muscle aches, fatigue, fibromyalgia   - just to name a few. ============================================================ ============================================================  -   All Else - CBC - Kidneys - Electrolytes - Liver - Magnesium & Thyroid    - all  Normal / OK ===========================================================

## 2021-03-28 ENCOUNTER — Other Ambulatory Visit: Payer: Self-pay | Admitting: Adult Health

## 2021-04-01 ENCOUNTER — Other Ambulatory Visit: Payer: Self-pay | Admitting: Internal Medicine

## 2021-04-01 DIAGNOSIS — J439 Emphysema, unspecified: Secondary | ICD-10-CM

## 2021-04-09 DIAGNOSIS — H11001 Unspecified pterygium of right eye: Secondary | ICD-10-CM | POA: Diagnosis not present

## 2021-04-09 DIAGNOSIS — H2512 Age-related nuclear cataract, left eye: Secondary | ICD-10-CM | POA: Diagnosis not present

## 2021-04-09 DIAGNOSIS — H353131 Nonexudative age-related macular degeneration, bilateral, early dry stage: Secondary | ICD-10-CM | POA: Diagnosis not present

## 2021-04-09 DIAGNOSIS — H35371 Puckering of macula, right eye: Secondary | ICD-10-CM | POA: Diagnosis not present

## 2021-06-21 ENCOUNTER — Other Ambulatory Visit: Payer: Self-pay | Admitting: Internal Medicine

## 2021-06-21 ENCOUNTER — Other Ambulatory Visit: Payer: Self-pay | Admitting: Adult Health

## 2021-06-21 DIAGNOSIS — J439 Emphysema, unspecified: Secondary | ICD-10-CM

## 2021-06-23 ENCOUNTER — Other Ambulatory Visit: Payer: Self-pay | Admitting: Internal Medicine

## 2021-06-23 DIAGNOSIS — J439 Emphysema, unspecified: Secondary | ICD-10-CM

## 2021-06-24 NOTE — Progress Notes (Deleted)
MEDICARE ANNUAL WELLNESS VISIT AND FOLLOW UP U[employer] Assessment:   Encounter for Medicare annual wellness exam 1 year  Medication management Monitor  Pulmonary emphysema, unspecified emphysema type ([place]) On recent CXR due to cough Given trelegy inhaler Non smoker, will monitor  Aortic atherosclerosis ([place]) Via CXR 10/2019 Control blood pressure, cholesterol, glucose, increase exercise.   Cough -     DG Chest 2 View; Future - did not response to GERD meds, has been a year, has history of asthma, can with exertion, will treat as asthma, get CXR rule out mass/cancer- may refer to pulmonary for PFTs/evaluation pending results.   Abnormal glucose Discussed disease progression and risks Discussed diet/exercise, weight management and risk modification  Essential hypertension - continue medications, DASH diet, exercise and monitor at home. Call if greater than 130/80.   BPH with obstruction/lower urinary tract symptoms Monitor  Vitamin D deficiency Continue supplement  Hyperlipidemia, mixed decrease fatty foods increase activity.   Positive colorectal cancer screening using Cologuard test Had colonoscopy, following with GI now.    Future Appointments  Date Time Provider [place]  06/25/2021 10:30 AM Magda Bernheim, NP [facility] None  10/24/2021 10:30 AM Magda Bernheim, NP [facility] None  03/25/2022 11:00 AM Unk Pinto, MD [facility] None     Plan:   During the course of the visit the patient was educated and counseled about appropriate screening and preventive services including:   Pneumococcal vaccine  Influenza vaccine Td vaccine Screening electrocardiogram Colorectal cancer screening Diabetes screening Glaucoma screening Nutrition counseling     Subjective:  Carlos Baldwin is a 86 y.o. male who presents for Medicare Annual Wellness Visit and 3 month follow up for HTN, hyperlipidemia, prediabetes, and vitamin D Def.   Never smoker, never  secondary exposure. Cop for 50 years, still working. Has remote history of asthma, diagnosed at 86 years old, has history of allergies, will wear a mask when cutting grass/etc.  He reports 1 year of persistent dry cough. Was tried on zantac and mucinex with some help.  Worse when he is active, walking around, talking. Better at night when lying down, sleeps well.  No wheezing, no SOB.  He does not take the mobic often.  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  Wt Readings from Last 3 Encounters:  03/25/21 179 lb 6.4 oz (81.4 kg)  09/25/20 176 lb (79.8 kg)  07/11/20 179 lb (81.2 kg)   His blood pressure has been controlled at home, today their BP is   He does workout, he rides bike at home, has retired from police work.   He denies chest pain, shortness of breath, dizziness.   He is not on cholesterol medication and denies myalgias. His cholesterol is at goal. The cholesterol last visit was:   Lab Results  Component Value Date   CHOL 140 03/25/2021   HDL 57 03/25/2021   LDLCALC 70 03/25/2021   TRIG 46 03/25/2021   CHOLHDL 2.5 03/25/2021   He has been working on diet and exercise for prediabetes, and denies paresthesia of the feet, polydipsia and polyuria. Last A1C in the office was:   Lab Results  Component Value Date   HGBA1C 5.8 (H) 03/25/2021   Patient is on Vitamin D supplement, was off Lab Results  Component Value Date   VD25OH 50 03/25/2021   Lab Results  Component Value Date   GFRNONAA 73 09/25/2020     Medication Review: Current Outpatient Medications on File Prior to Visit  Medication Sig Dispense Refill   CHOLECALCIFEROL PO Take 2,000 Units by mouth daily.     finasteride (PROSCAR) 5 MG tablet TAKE 1 TABLET BY MOUTH DAILY FOR PROSTATE 90 tablet 3   fluorouracil (EFUDEX) 5 % cream Apply to suspect areas 2 x / day til skin raw 40 g 1    losartan (COZAAR) 100 MG tablet TAKE 1 TABLET BY MOUTH DAILY FOR BLOOD PRESSURE 90 tablet 3   meloxicam (MOBIC) 15 MG tablet TAKE 1/2 TO 1 TABLET BY MOUTH DAILY WITH FOOD AS NEEDED FOR PAIN AND INFLAMMATION 90 tablet 0   montelukast (SINGULAIR) 10 MG tablet Take 1 tablet daily for Allergies & Asthma 90 tablet 3   Polyethyl Glycol-Propyl Glycol (SYSTANE OP) Place 1 drop into both eyes 3 (three) times daily as needed (Dry eyes).      TRELEGY ELLIPTA 100-62.5-25 MCG/INH AEPB INHALE 1 PUFF BY MOUTH DAILY 180 each 1   No current facility-administered medications on file prior to visit.   Review of Systems  Constitutional:  Negative for chills, diaphoresis, fever, malaise/fatigue and weight loss.  HENT:  Negative for congestion, ear discharge, ear pain, hearing loss, nosebleeds and tinnitus.   Eyes: Negative.  Negative for blurred vision and double vision.  Respiratory:  Positive for cough and wheezing. Negative for hemoptysis, sputum production, shortness of breath and stridor.   Cardiovascular:  Negative for chest pain, palpitations, orthopnea, claudication and leg swelling.  Gastrointestinal: Negative.  Negative for abdominal pain, constipation, diarrhea, heartburn, nausea and vomiting.  Genitourinary: Negative.   Musculoskeletal:  Negative for back pain, falls, joint pain, myalgias and neck pain.  Skin: Negative.  Negative for rash.  Neurological: Negative.  Negative for dizziness, tingling, tremors, loss of consciousness, weakness and headaches.  Psychiatric/Behavioral: Negative.  Negative for depression, memory loss, substance abuse and suicidal ideas. The patient is not nervous/anxious and does not have insomnia.    Current Problems (verified) Patient Active Problem List   Diagnosis Date Noted   CKD (chronic kidney disease) stage 3, GFR 30-59 ml/min ([place]) 07/12/2020   Lung nodules 11/07/2019   Aortic atherosclerosis ([place]) by Chest CT scan Nov 2021 10/26/2019   COPD (chronic obstructive  pulmonary disease) ([place]) 10/26/2019   BPH with obstruction/lower urinary tract symptoms 03/07/2019   Abnormal glucose 03/07/2019   Medication management 12/21/2013   Essential hypertension 08/02/2013   Hyperlipidemia, mixed 08/02/2013   Vitamin D deficiency 08/02/2013    Screening Tests Immunization History  Administered Date(s) Administered   Influenza, High Dose Seasonal PF 06/03/2018, 05/25/2019   Influenza-Unspecified 06/06/2020   PFIZER(Purple Top)SARS-COV-2 Vaccination 11/12/2019, 12/12/2019   Pneumococcal Conjugate-13 06/27/2014   Pneumococcal Polysaccharide-23 09/07/2006   Td 03/08/2019   Tdap 09/18/2019   Zoster, Live 09/07/2008   Preventative care: Last colonoscopy: 08/2019 negative , no repeat due to age cologuard 2020 +- had colonoscopy [place] AB 12/2011 CXR 2002 CT head 09/2019  Prior vaccinations: TD or Tdap: 2020  Influenza: 2020 Pneumococcal: 2008 Prevnar: 2015 Shingles/Zostavax: 2010  Names of Other Physician/Practitioners you currently use: 1. [facility] here for primary care Dr. Ellie Lunch- vision, last visit 2020 Dr. Sabas Sous, last visit 2020, goes q54m  Patient Care Team: Unk Pinto, MD as PCP - General (Internal Medicine) Garald Balding, MD as Consulting Physician (Orthopedic Surgery) Ladene Artist, MD as Consulting Physician (Gastroenterology) Posey Pronto, DDS as  Consulting Physician (Dentistry) Luberta Mutter, MD as Consulting Physician (Ophthalmology)   Allergies Allergies  Allergen Reactions   Atenolol Other (See Comments)    "not sure"   Pravastatin     Joint pain   Simvastatin     Joint pain   Sulfa Antibiotics    Sulfonamide Derivatives Swelling and Rash    SURGICAL HISTORY He  has a past surgical history that includes Knee surgery (Right, 1972); left knee arthroscopy (01-14-2012); Total knee arthroplasty (Left, 11/04/2015); and Total knee arthroplasty (Right, 10/11/2017). FAMILY  HISTORY His family history is not on file. - mother/father passed in 90's no history, siblings healthy SOCIAL HISTORY He  reports that he has never smoked. He has never used smokeless tobacco. He reports that he does not drink alcohol and does not use drugs.  MEDICARE WELLNESS OBJECTIVES: Physical activity:   Cardiac risk factors:   Depression/mood screen:   Depression screen [facility] 2/9 03/24/2021  Decreased Interest 0  Down, Depressed, Hopeless 0  PHQ - 2 Score 0    ADLs:  In your present state of health, do you have any difficulty performing the following activities: 03/24/2021  Hearing? N  Vision? N  Difficulty concentrating or making decisions? N  Walking or climbing stairs? N  Dressing or bathing? N  Doing errands, shopping? N  Some recent data might be hidden     Cognitive Testing  Alert? Yes  Normal Appearance?Yes  Oriented to person? Yes  Place? Yes   Time? Yes  Recall of three objects?  Yes  Can perform simple calculations? Yes  Displays appropriate judgment?Yes  Can read the correct time from a watch face?Yes  EOL planning:   yes   Objective:   There were no vitals taken for this visit. There is no height or weight on file to calculate BMI.  General appearance: alert, no distress, WD/WN, male HEENT: normocephalic, sclerae anicteric, TMs pearly, nares patent, no discharge or erythema, pharynx normal Oral cavity: MMM, no lesions Neck: supple, no lymphadenopathy, no thyromegaly, no masses Heart: RRR, distant heart sounds, normal S1, S2, no murmurs Lungs: CTA bilaterally, slight barrel chest, no wheezes, rhonchi, or rales Abdomen: +bs, soft, non tender, non distended, no masses, no hepatomegaly, no splenomegaly Musculoskeletal: nontender, no swelling, no obvious deformity Extremities: no edema, no cyanosis, no clubbing Skin: left face and left posterior ear with erythematous nodule/plaques Pulses: 2+ symmetric, upper and lower extremities, normal cap  refill Neurological: alert, oriented x 3, CN2-12 intact, strength normal upper extremities and lower extremities, sensation normal throughout, DTRs 2+ throughout, no cerebellar signs, gait antalgic due to knees Psychiatric: normal affect, behavior normal, pleasant   Medicare Attestation I have personally reviewed: The patient's medical and social history Their use of alcohol, tobacco or illicit drugs Their current medications and supplements The patient's functional ability including ADLs,fall risks, home safety risks, cognitive, and hearing and visual impairment Diet and physical activities Evidence for depression or mood disorders  The patient's weight, height, BMI, and visual acuity have been recorded in the chart.  I have made referrals, counseling, and provided education to the patient based on review of the above and I have provided the patient with a written personalized care plan for preventive services.     Magda Bernheim, NP   06/24/2021

## 2021-06-25 ENCOUNTER — Ambulatory Visit: Payer: Medicare Other | Admitting: Nurse Practitioner

## 2021-07-04 ENCOUNTER — Other Ambulatory Visit: Payer: Self-pay | Admitting: Internal Medicine

## 2021-07-04 DIAGNOSIS — J4520 Mild intermittent asthma, uncomplicated: Secondary | ICD-10-CM

## 2021-07-04 MED ORDER — TRELEGY ELLIPTA 200-62.5-25 MCG/ACT IN AEPB: INHALATION_SPRAY | RESPIRATORY_TRACT | 3 refills | Status: DC

## 2021-07-18 ENCOUNTER — Other Ambulatory Visit: Payer: Self-pay | Admitting: Internal Medicine

## 2021-07-18 DIAGNOSIS — J4520 Mild intermittent asthma, uncomplicated: Secondary | ICD-10-CM

## 2021-08-20 ENCOUNTER — Other Ambulatory Visit: Payer: Self-pay

## 2021-08-20 DIAGNOSIS — I10 Essential (primary) hypertension: Secondary | ICD-10-CM

## 2021-08-20 MED ORDER — LOSARTAN POTASSIUM 100 MG PO TABS: ORAL_TABLET | ORAL | 3 refills | Status: DC

## 2021-09-19 ENCOUNTER — Other Ambulatory Visit: Payer: Self-pay | Admitting: Nurse Practitioner

## 2021-10-19 ENCOUNTER — Other Ambulatory Visit: Payer: Self-pay | Admitting: Adult Health

## 2021-10-22 NOTE — Progress Notes (Addendum)
6 MONTH FOLLOW UP Assessment:     Aortic atherosclerosis ([place]) Via CXR 10/2019 Control blood pressure, cholesterol, glucose, increase exercise.   Essential hypertension - continue medications, DASH diet, exercise and monitor at home. Call if greater than 130/80.   Hyperlipidemia, mixed decrease fatty foods increase activity.   Overweight Recommended diet heavy in fruits and veggies and low in animal meats, cheeses, and dairy products, appropriate calorie intake Continue exercise Follow up at next visit   Pulmonary emphysema, unspecified emphysema type ([place]) Per CXR; Non smoker; improved with trellegy ellipta - given trellegy and breztri samples due to donut whole; will monitor  Pulmonary nodule CT 07/11/20 stable right middle lobe nodule Repeat CT of chest ordered for next month If stable should need no further imaging Addendum: Pt has declined the additional CT of chest  Acute left shoulder pain/ bicep tendonitis left Prednisone taper, rest, ice If no improvement will refer to orthopedics  Abnormal glucose Discussed disease progression and risks Discussed diet/exercise, weight management and risk modification  Vitamin D deficiency Continue supplement  Medication management Monitor  Future Appointments  Date Time Provider [place]  10/24/2021 10:30 AM Magda Bernheim, NP [facility] None  03/25/2022 11:00 AM Unk Pinto, MD [facility] None     Subjective:  Carlos Baldwin is a 87 y.o. male who presents for 3 month follow up for HTN, hyperlipidemia, prediabetes, and vitamin D Def.    He had + cologuard in 2020, underwent colonoscopy/polpectomy by Dr. Fuller Plan, recommended no follow up due to age.   Persistent cough, has emphysematous changes per CXR; never smoker, remote hx of asthma as a child Now on singulair, trellegy with excellent results, but currently in the donut whole  had CT CT chest 11/06/2019 Noncalcified 7 mm pulmonary nodule within the  right middle lobe. Repeat 07/2020 showed stability, recommend repeat 18-24 months from initial which is next month  He is having pain in left shoulder which occurred when he was doing bicep curls 2 days ago. He did not hear a pop but has pain in left shoulder with movement which he describes as a sharp pain.  He  can not raise arm to the front without pain. Minimal pain when raising laterally or to the back.  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 He is watching portions. Wt Readings from Last 3 Encounters:  10/24/21 172 lb 3.2 oz (78.1 kg)  03/25/21 179 lb 6.4 oz (81.4 kg)  09/25/20 176 lb (79.8 kg)   He has aortic atherosclerosis per CT 11/2019 His blood pressure has been controlled at home, today their BP is BP: 138/88 BP Readings from Last 3 Encounters:  10/24/21 138/88  03/25/21 140/90  09/25/20 126/84    He does workout, he rides bike at home, has retired from police work.   He denies chest pain, shortness of breath, dizziness.   He is not on cholesterol medication and denies myalgias. His cholesterol is at goal. The cholesterol last visit was:   Lab Results  Component Value Date   CHOL 140 03/25/2021   HDL 57 03/25/2021   LDLCALC 70 03/25/2021   TRIG 46 03/25/2021   CHOLHDL 2.5 03/25/2021   He has been working on diet and exercise for prediabetes, and denies paresthesia of the feet, polydipsia and polyuria. Last A1C in the office was:   Lab Results  Component Value Date   HGBA1C  5.8 (H) 03/25/2021   Patient is on Vitamin D supplement, was off Lab Results  Component Value Date   VD25OH 50 03/25/2021   Lab Results  Component Value Date   GFRNONAA 73 09/25/2020     Medication Review: Current Outpatient Medications on File Prior to Visit  Medication Sig Dispense Refill   CHOLECALCIFEROL PO Take 2,000 Units by mouth daily.     finasteride (PROSCAR) 5 MG tablet TAKE 1 TABLET  BY MOUTH DAILY FOR PROSTATE 90 tablet 3   fluorouracil (EFUDEX) 5 % cream Apply to suspect areas 2 x / day til skin raw 40 g 1   Fluticasone-Umeclidin-Vilant (TRELEGY ELLIPTA) 200-62.5-25 MCG/ACT AEPB Use  1 inhalation  Daily 180 each 3   losartan (COZAAR) 100 MG tablet TAKE 1 TABLET BY MOUTH DAILY FOR BLOOD PRESSURE 90 tablet 3   montelukast (SINGULAIR) 10 MG tablet TAKE 1 TABLET BY MOUTH DAILY FOR ALLERGIES OR ASTHMA 90 tablet 3   Polyethyl Glycol-Propyl Glycol (SYSTANE OP) Place 1 drop into both eyes 3 (three) times daily as needed (Dry eyes).      meloxicam (MOBIC) 15 MG tablet TAKE 1/2 TO 1 TABLET BY MOUTH DAILY WITH FOOD AS NEEDED FOR PAIN AND INFLAMMATION (Patient not taking: Reported on 10/24/2021) 90 tablet 0   No current facility-administered medications on file prior to visit.   Current Problems (verified) Patient Active Problem List   Diagnosis Date Noted   CKD (chronic kidney disease) stage 3, GFR 30-59 ml/min ([place]) 07/12/2020   Lung nodules 11/07/2019   Aortic atherosclerosis ([place]) by Chest CT scan Nov 2021 10/26/2019   COPD (chronic obstructive pulmonary disease) ([place]) 10/26/2019   BPH with obstruction/lower urinary tract symptoms 03/07/2019   Abnormal glucose 03/07/2019   Medication management 12/21/2013   Essential hypertension 08/02/2013   Hyperlipidemia, mixed 08/02/2013   Vitamin D deficiency 08/02/2013     Allergies Allergies  Allergen Reactions   Atenolol Other (See Comments)    "not sure"   Pravastatin     Joint pain   Simvastatin     Joint pain   Sulfa Antibiotics    Sulfonamide Derivatives Swelling and Rash    SURGICAL HISTORY He  has a past surgical history that includes Knee surgery (Right, 1972); left knee arthroscopy (01-14-2012); Total knee arthroplasty (Left, 11/04/2015); and Total knee arthroplasty (Right, 10/11/2017). FAMILY HISTORY His family history is not on file.  SOCIAL HISTORY He  reports that he has never smoked. He has never used  smokeless tobacco. He reports that he does not drink alcohol and does not use drugs.   Review of Systems  Constitutional:  Negative for malaise/fatigue and weight loss.  HENT:  Negative for hearing loss and tinnitus.   Eyes:  Negative for blurred vision and double vision.  Respiratory:  Negative for cough, shortness of breath and wheezing.   Cardiovascular:  Negative for chest pain, palpitations, orthopnea, claudication and leg swelling.  Gastrointestinal:  Negative for abdominal pain, blood in stool, constipation, diarrhea, heartburn, melena, nausea and vomiting.  Genitourinary: Negative.   Musculoskeletal:  Positive for joint pain (left shoulder). Negative for myalgias.  Skin:  Negative for rash.  Neurological:  Negative for dizziness, tingling, sensory change, weakness and headaches.  Endo/Heme/Allergies:  Negative for polydipsia.  Psychiatric/Behavioral: Negative.    All other systems reviewed and are negative.   Objective:   Blood pressure 138/88, pulse 98, temperature 97.9 F (36.6 C), resp. rate 16, height 5\' 9"  (1.753 m), weight 172 lb 3.2 oz (78.1  kg), SpO2 96 %. Body mass index is 25.43 kg/m.  General appearance: alert, no distress, WD/WN, male HEENT: normocephalic, sclerae anicteric, TMs pearly, nares patent, no discharge or erythema, pharynx normal Oral cavity: MMM, no lesions Neck: supple, no lymphadenopathy, no thyromegaly, no masses Heart: RRR, distant heart sounds, normal S1, S2, no murmurs Lungs: CTA bilaterally, slight barrel chest, no wheezes, rhonchi, or rales Abdomen: +bs, soft, non tender, non distended, no masses, no hepatomegaly, no splenomegaly Musculoskeletal: nontender, no swelling, no obvious deformity Shoulder Musculoskeletal Exam  Inspection   Right     Right shoulder inspection is normal.   Left     Left shoulder inspection is normal.  Palpation   Right     Crepitus: no crepitus     Increased warmth: none     Tenderness: present        Anterior shoulder: moderate       Posterior shoulder: mild       Clavicle: none       AC joint: none       Sternoclavicular joint: none       Rotator cuff: moderate       Greater tuberosity: none       Trapezius: none       Superior pole of scapula: none       Inferior pole of scapula: none       Bicipital groove: moderate       Proximal biceps: moderate       Distal biceps: mild       Lateral arm: mild       Elbow: none   Left     Left shoulder palpation is normal.  Range of Motion   Right     Active ROM: abnormal.      Passive ROM: abnormal.    Left     Left shoulder range of motion is normal.   Extremities: no edema, no cyanosis, no clubbing Skin: left face and left posterior ear with erythematous nodule/plaques Pulses: 2+ symmetric, upper and lower extremities, normal cap refill Neurological: alert, oriented x 3, CN2-12 intact, strength normal upper extremities and lower extremities, sensation normal throughout, DTRs 2+ throughout, no cerebellar signs, gait antalgic due to knees Psychiatric: normal affect, behavior normal, pleasant    Magda Bernheim, NP   10/24/2021

## 2021-10-24 ENCOUNTER — Other Ambulatory Visit: Payer: Self-pay

## 2021-10-24 ENCOUNTER — Ambulatory Visit (INDEPENDENT_AMBULATORY_CARE_PROVIDER_SITE_OTHER): Payer: Medicare Other | Admitting: Nurse Practitioner

## 2021-10-24 ENCOUNTER — Encounter: Payer: Self-pay | Admitting: Nurse Practitioner

## 2021-10-24 VITALS — BP 138/88 | HR 98 | Temp 97.9°F | Resp 16 | Ht 69.0 in | Wt 172.2 lb

## 2021-10-24 DIAGNOSIS — R918 Other nonspecific abnormal finding of lung field: Secondary | ICD-10-CM | POA: Diagnosis not present

## 2021-10-24 DIAGNOSIS — M7522 Bicipital tendinitis, left shoulder: Secondary | ICD-10-CM

## 2021-10-24 DIAGNOSIS — E782 Mixed hyperlipidemia: Secondary | ICD-10-CM

## 2021-10-24 DIAGNOSIS — E559 Vitamin D deficiency, unspecified: Secondary | ICD-10-CM | POA: Diagnosis not present

## 2021-10-24 DIAGNOSIS — J439 Emphysema, unspecified: Secondary | ICD-10-CM

## 2021-10-24 DIAGNOSIS — Z79899 Other long term (current) drug therapy: Secondary | ICD-10-CM | POA: Diagnosis not present

## 2021-10-24 DIAGNOSIS — R7309 Other abnormal glucose: Secondary | ICD-10-CM

## 2021-10-24 DIAGNOSIS — M25512 Pain in left shoulder: Secondary | ICD-10-CM

## 2021-10-24 DIAGNOSIS — E663 Overweight: Secondary | ICD-10-CM

## 2021-10-24 DIAGNOSIS — I10 Essential (primary) hypertension: Secondary | ICD-10-CM | POA: Diagnosis not present

## 2021-10-24 DIAGNOSIS — I700 Atherosclerosis of aorta: Secondary | ICD-10-CM

## 2021-10-24 MED ORDER — PREDNISONE 20 MG PO TABS: ORAL_TABLET | ORAL | 0 refills | Status: AC

## 2021-10-24 NOTE — Patient Instructions (Signed)
Proximal Biceps Tendinitis and Tenosynovitis The proximal biceps tendon is a strong cord of tissue that connects the biceps muscle on the front of the upper arm to the shoulder blade. Tendinitis is inflammation of a tendon. Tenosynovitis is inflammation of the lining around the tendon (tendon sheath). These conditions often occur at the same time, and they can interfere with the ability to bend the elbow and turn the palm of the hand up. Proximal biceps tendinitis and tenosynovitis are usually caused by overusing the shoulder joint and the biceps muscle. These conditions usually heal within 6 weeks. Proximal biceps tendinitis may include a grade 1 or grade 2 strain of the tendon. A grade 1 strain is mild, and it involves a slight pull of the tendon without any stretching or noticeable tearing of the tendon. There is usually no loss of biceps muscle strength. A grade 2 strain is moderate, and it involves a small tear in the tendon. The tendon is stretched, and biceps strength is usually decreased. What are the causes? This condition may be caused by: A sudden increase in frequency or intensity of activity that involves the shoulder and the biceps muscle. Overuse of the biceps muscle. This can happen when you do the same movements over and over, such as: Turning the palm of the hand up. Forceful straightening (hyperextension) of the elbow. Bending the elbow. A direct, forceful hit or injury to the elbow. This is rare. What increases the risk? The following factors may make you more likely to develop this condition: Playing contact sports. Playing sports that involve throwing and overhead movements, including racket sports, gymnastics, weight lifting, or bodybuilding. Doing physical labor. Having poor strength and flexibility of the arm and shoulder. What are the signs or symptoms? Symptoms of this condition may include: Pain and inflammation in the front of the shoulder. A feeling of warmth in  the front of the shoulder. Limited range of motion of the shoulder and the elbow. A crackling sound (crepitation) when you move or touch the shoulder or the upper arm. In some cases, symptoms may return after treatment, and they may be long-lasting (chronic). How is this diagnosed? This condition is diagnosed based on: Your symptoms. Your medical history. Physical exam. X-ray or MRI, if needed. How is this treated? Treatment for this condition depends on the severity of your injury. It may include: Resting the injured arm. Icing the injured area. Doing physical therapy. Your health care provider may also use: Medicines to treat pain and inflammation. Sound waves to treat the injured muscle (ultrasound therapy). Medicines that are injected to the muscle (corticosteroids). Medicines that numb the area (local anesthetics). Surgery. This is done if other treatments have not worked. Follow these instructions at home: Managing pain, stiffness, and swelling   If directed, put ice on the injured area. Put ice in a plastic bag. Place a towel between your skin and the bag. Leave the ice on for 20 minutes, 2-3 times a day. If directed, apply heat to the affected area before you exercise. Use the heat source that your health care provider recommends, such as a moist heat pack or a heating pad. Place a towel between your skin and the heat source. Leave the heat on for 20-30 minutes. Remove the heat if your skin turns bright red. This is especially important if you are unable to feel pain, heat, or cold. You may have a greater risk of getting burned. Move your fingers often to reduce stiffness and swelling. Raise (elevate)  the injured area above the level of your heart while you are lying down. Activity Do not lift anything that is heavier than 10 lb (4.5 kg), or the limit that you are told, until your health care provider says that it is safe. Avoid activities that cause pain or make your  condition worse. Return to your normal activities as told by your health care provider. Ask your health care provider what activities are safe for you. Do exercises as told by your health care provider. General instructions Take over-the-counter and prescription medicines only as told by your health care provider. Do not use any products that contain nicotine or tobacco, such as cigarettes, e-cigarettes, and chewing tobacco. These can delay healing. If you need help quitting, ask your health care provider. Keep all follow-up visits as told by your health care provider. This is important. How is this prevented? Warm up and stretch before being active. Cool down and stretch after being active. Give your body time to rest between periods of activity. Make sure any equipment that you use is fitted to you. Be safe and responsible while being active to avoid falls. Maintain physical fitness, including: Strength. Flexibility. Heart health (cardiovascular fitness). The ability to use muscles for a long time (endurance). Contact a health care provider if: You have symptoms that get worse or do not get better after 2 weeks of treatment. You develop new symptoms. Get help right away if: You develop severe pain. Summary Tendinitis is inflammation of the biceps tendon. Tenosynovitis is inflammation of the lining around the biceps tendon. These conditions often occur at the same time. These conditions are usually caused by overusing the shoulder joint and biceps muscle. Symptoms include pain, warmth in the shoulder, and limited range of motion. The two conditions are treated with rest, ice, medicines, and surgery (rare). This information is not intended to replace advice given to you by your health care provider. Make sure you discuss any questions you have with your health care provider. Document Revised: 10/16/2020 Document Reviewed: 10/16/2020 Elsevier Patient Education  [place].

## 2021-10-25 LAB — COMPLETE METABOLIC PANEL WITH GFR
AG Ratio: 1.7 (calc) (ref 1.0–2.5)
ALT: 17 U/L (ref 9–46)
AST: 13 U/L (ref 10–35)
Albumin: 4.1 g/dL (ref 3.6–5.1)
Alkaline phosphatase (APISO): 78 U/L (ref 35–144)
BUN: 15 mg/dL (ref 7–25)
CO2: 26 mmol/L (ref 20–32)
Calcium: 9.2 mg/dL (ref 8.6–10.3)
Chloride: 105 mmol/L (ref 98–110)
Creat: 0.98 mg/dL (ref 0.70–1.22)
Globulin: 2.4 g/dL (calc) (ref 1.9–3.7)
Glucose, Bld: 141 mg/dL — ABNORMAL HIGH (ref 65–99)
Potassium: 3.9 mmol/L (ref 3.5–5.3)
Sodium: 139 mmol/L (ref 135–146)
Total Bilirubin: 0.9 mg/dL (ref 0.2–1.2)
Total Protein: 6.5 g/dL (ref 6.1–8.1)
eGFR: 76 mL/min/{1.73_m2} (ref >=60)

## 2021-10-25 LAB — CBC WITH DIFFERENTIAL/PLATELET
Absolute Monocytes: 1021 cells/uL — ABNORMAL HIGH (ref 200–950)
Basophils Absolute: 42 cells/uL (ref 0–200)
Basophils Relative: 0.5 %
Eosinophils Absolute: 100 cells/uL (ref 15–500)
Eosinophils Relative: 1.2 %
HCT: 42.8 % (ref 38.5–50.0)
Hemoglobin: 14.5 g/dL (ref 13.2–17.1)
Lymphs Abs: 1303 cells/uL (ref 850–3900)
MCH: 30.7 pg (ref 27.0–33.0)
MCHC: 33.9 g/dL (ref 32.0–36.0)
MCV: 90.7 fL (ref 80.0–100.0)
MPV: 9.4 fL (ref 7.5–12.5)
Monocytes Relative: 12.3 %
Neutro Abs: 5835 cells/uL (ref 1500–7800)
Neutrophils Relative %: 70.3 %
Platelets: 246 10*3/uL (ref 140–400)
RBC: 4.72 10*6/uL (ref 4.20–5.80)
RDW: 12.8 % (ref 11.0–15.0)
Total Lymphocyte: 15.7 %
WBC: 8.3 10*3/uL (ref 3.8–10.8)

## 2021-10-25 LAB — LIPID PANEL
Cholesterol: 161 mg/dL (ref <200)
HDL: 64 mg/dL (ref >=40)
LDL Cholesterol (Calc): 84 mg/dL (calc)
Non-HDL Cholesterol (Calc): 97 mg/dL (calc) (ref <130)
Total CHOL/HDL Ratio: 2.5 (calc) (ref <5.0)
Triglycerides: 52 mg/dL (ref <150)

## 2021-10-25 LAB — HEMOGLOBIN A1C
Hgb A1c MFr Bld: 5.9 % of total Hgb — ABNORMAL HIGH (ref <5.7)
Mean Plasma Glucose: 123 mg/dL
eAG (mmol/L): 6.8 mmol/L

## 2021-12-18 ENCOUNTER — Other Ambulatory Visit: Payer: Self-pay | Admitting: Adult Health

## 2021-12-18 ENCOUNTER — Encounter: Payer: Self-pay | Admitting: Adult Health

## 2022-03-18 ENCOUNTER — Other Ambulatory Visit: Payer: Self-pay | Admitting: Adult Health

## 2022-03-24 ENCOUNTER — Ambulatory Visit: Payer: Medicare Other | Admitting: Nurse Practitioner

## 2022-03-25 ENCOUNTER — Encounter: Payer: Self-pay | Admitting: Internal Medicine

## 2022-03-25 ENCOUNTER — Ambulatory Visit (INDEPENDENT_AMBULATORY_CARE_PROVIDER_SITE_OTHER): Payer: Medicare Other | Admitting: Internal Medicine

## 2022-03-25 VITALS — BP 136/74 | HR 71 | Temp 97.9°F | Resp 17 | Ht 69.0 in | Wt 171.8 lb

## 2022-03-25 DIAGNOSIS — I700 Atherosclerosis of aorta: Secondary | ICD-10-CM

## 2022-03-25 DIAGNOSIS — Z79899 Other long term (current) drug therapy: Secondary | ICD-10-CM

## 2022-03-25 DIAGNOSIS — Z1211 Encounter for screening for malignant neoplasm of colon: Secondary | ICD-10-CM

## 2022-03-25 DIAGNOSIS — Z125 Encounter for screening for malignant neoplasm of prostate: Secondary | ICD-10-CM

## 2022-03-25 DIAGNOSIS — N138 Other obstructive and reflux uropathy: Secondary | ICD-10-CM

## 2022-03-25 DIAGNOSIS — R7309 Other abnormal glucose: Secondary | ICD-10-CM

## 2022-03-25 DIAGNOSIS — Z0000 Encounter for general adult medical examination without abnormal findings: Secondary | ICD-10-CM | POA: Diagnosis not present

## 2022-03-25 DIAGNOSIS — I10 Essential (primary) hypertension: Secondary | ICD-10-CM

## 2022-03-25 DIAGNOSIS — E782 Mixed hyperlipidemia: Secondary | ICD-10-CM

## 2022-03-25 DIAGNOSIS — E559 Vitamin D deficiency, unspecified: Secondary | ICD-10-CM | POA: Diagnosis not present

## 2022-03-25 DIAGNOSIS — Z136 Encounter for screening for cardiovascular disorders: Secondary | ICD-10-CM

## 2022-03-25 DIAGNOSIS — Z0001 Encounter for general adult medical examination with abnormal findings: Secondary | ICD-10-CM

## 2022-03-25 NOTE — Progress Notes (Signed)
Annual  Screening/Preventative Visit  & Comprehensive Evaluation & Examination  Future Appointments  Date Time Provider Department  03/25/2022                 cpe 11:00 AM Unk Pinto, MD [facility]  03/30/2023                cpe 11:00 AM Unk Pinto, MD [facility]              This very nice 87 y.o.  MWM presents for a Screening /Preventative Visit & comprehensive evaluation and management of multiple medical co-morbidities.  Patient has been followed for HTN, HLD, Prediabetes and Vitamin D Deficiency. Chest CTscan in Nov 2021 revealed  Aortic Atherosclerosis (I70.0). CXR on 10/25/2019 showed Hyperinflation consistent with COPD (J43.9)       HTN predates since  2003. Patient's BP has been controlled at home.  Today's BP is high normal  at goal - 136/74 . Patient denies any cardiac symptoms as chest pain, palpitations, shortness of breath, dizziness or ankle swelling.       Patient's hyperlipidemia is controlled with diet and medications. Patient denies myalgias or other medication SE's. Last lipids were  at goal:  Lab Results  Component Value Date   CHOL 148 09/25/2020   HDL 52 09/25/2020   LDLCALC 80 09/25/2020   TRIG 76 09/25/2020   CHOLHDL 2.8 09/25/2020         Patient has hx/o prediabetes (A1c 6.1% /2011) and patient denies reactive hypoglycemic symptoms, visual blurring, diabetic polys or paresthesias. Last A1c was not at goal:   Lab Results  Component Value Date   HGBA1C 6.0 (H) 09/25/2020          Finally, patient has history of Vitamin D Deficiency ("35" /2008) and last vitamin D was at goal:   Lab Results  Component Value Date   VD25OH 63 09/25/2020     Current Outpatient Medications on File Prior to Visit  Medication Sig   CHOLECALCIFEROL P Take 2,000 Units by mouth daily.   finasteride  5 MG tablet TAKE 1 TABLET BY MOUTH DAILY FOR PROSTATE   fluorouracil 5 % cream Apply to suspect areas 2 x / day til skin raw   TRELEGY ELLIPTA 100-62.5-25   Use 1 inhalation daily   losartan 100 MG tablet TAKE 1 TABLET BY MOUTH DAILY FOR BLOOD PRESSURE   meloxicam 15 MG tablet TAKE 1/2 TO 1 TABLET BY MOUTH DAILY WITH FOOD AS NEEDED FOR PAIN AND INFLAMMATION   montelukast 10 MG tablet Take 1 tablet daily for Allergies & Asthma   SYSTANE Place 1 drop into both eyes 3 times daily as needed.     Allergies  Allergen Reactions   Atenolol Other (See Comments)    "not sure"   Pravastatin     Joint pain   Simvastatin     Joint pain   Sulfa Antibiotics    Sulfonamide Derivatives Swelling and Rash     Past Medical History:  Diagnosis Date   Arthritis    oa   Asthma    as child   Bladder neck obstruction 08/02/2013   Colon polyps    Hyperlipidemia    Hypertension    Measles    as child   Mumps    as child   Positive colorectal cancer screening using Cologuard test 07/17/2019   Pre-diabetes      Health Maintenance  Topic Date Due   Zoster Vaccines- Shingrix (1 of  2) Never done   COVID-19 Vaccine (3 - Pfizer risk series) 01/09/2020   INFLUENZA VACCINE  04/07/2021   TETANUS/TDAP  09/17/2029   PNA vac Low Risk Adult  Completed   HPV VACCINES  Aged Out     Immunization History  Administered Date(s) Administered   Influenza, High Dose  06/03/2018, 05/25/2019   Influenza 06/06/2020   PFIZER-SARS-COV-2 Vacc- 11/12/2019, 12/12/2019   Pneumococcal -13 06/27/2014   Pneumococcal -23 09/07/2006   Td 03/08/2019   Tdap 09/18/2019   Zoster, Live 09/07/2008    Last Colon -  08/18/2019 - Dr Fuller Plan - Negative & Recc No  f/u due to age   Past Surgical History:  Procedure Laterality Date   KNEE SURGERY Right 1972   cartlidge repair   left knee arthroscopy  01-14-2012   TOTAL KNEE ARTHROPLASTY Left 11/04/2015   Procedure: LEFT TOTAL KNEE ARTHROPLASTY;  Surgeon: Gaynelle Arabian, MD;  Location: [facility];  Service: Orthopedics;  Laterality: Left;   TOTAL KNEE ARTHROPLASTY Right 10/11/2017   Procedure: RIGHT TOTAL KNEE ARTHROPLASTY;  Surgeon:  Gaynelle Arabian, MD;  Location: [facility];  Service: Orthopedics;  Laterality: Right;     Family History  Problem Relation Age of Onset   Colon cancer Neg Hx    Esophageal cancer Neg Hx    Liver cancer Neg Hx    Stomach cancer Neg Hx     Social History   Socioeconomic History   Marital status: Married    Spouse name: Hilda Blades   Number of children: 1   son   Occupational History   Retired Art gallery manager   Tobacco Use   Smoking status: Never   Smokeless tobacco: Never  Vaping Use   Vaping Use: Never used  Substance and Sexual Activity   Alcohol use: No   Drug use: No   Sexual activity: Not on file      ROS Constitutional: Denies fever, chills, weight loss/gain, headaches, insomnia,  night sweats or change in appetite. Does c/o fatigue. Eyes: Denies redness, blurred vision, diplopia, discharge, itchy or watery eyes.  ENT: Denies discharge, congestion, post nasal drip, epistaxis, sore throat, earache, hearing loss, dental pain, Tinnitus, Vertigo, Sinus pain or snoring.  Cardio: Denies chest pain, palpitations, irregular heartbeat, syncope, dyspnea, diaphoresis, orthopnea, PND, claudication or edema Respiratory: denies cough, dyspnea, DOE, pleurisy, hoarseness, laryngitis or wheezing.  Gastrointestinal: Denies dysphagia, heartburn, reflux, water brash, pain, cramps, nausea, vomiting, bloating, diarrhea, constipation, hematemesis, melena, hematochezia, jaundice or hemorrhoids Genitourinary: Denies dysuria, frequency, urgency, nocturia, hesitancy, discharge, hematuria or flank pain Musculoskeletal: Denies arthralgia, myalgia, stiffness, Jt. Swelling, pain, limp or strain/sprain. Denies Falls. Skin: Denies puritis, rash, hives, warts, acne, eczema or change in skin lesion Neuro: No weakness, tremor, incoordination, spasms, paresthesia or pain Psychiatric: Denies confusion, memory loss or sensory loss. Denies Depression. Endocrine: Denies change in weight, skin, hair change, nocturia,  and paresthesia, diabetic polys, visual blurring or hyper / hypo glycemic episodes.  Heme/Lymph: No excessive bleeding, bruising or enlarged lymph nodes.   Physical Exam  BP 136/74   Pulse 71   Temp 97.9 F (36.6 C)   Resp 17   Ht '5\' 9"'$  (1.753 m)   Wt 171 lb 12.8 oz (77.9 kg)   SpO2 95%   BMI 25.37 kg/m   General Appearance: Well nourished and well groomed and in no apparent distress.  Eyes: PERRLA, EOMs, conjunctiva no swelling or erythema, normal fundi and vessels. Sinuses: No frontal/maxillary tenderness ENT/Mouth: EACs patent / TMs  nl. Nares clear  without erythema, swelling, mucoid exudates. Oral hygiene is good. No erythema, swelling, or exudate. Tongue normal, non-obstructing. Tonsils not swollen or erythematous. Hearing normal.  Neck: Supple, thyroid not palpable. No bruits, nodes or JVD. Respiratory: Respiratory effort normal.  BS equal and clear bilateral without rales, rhonci, wheezing or stridor. Cardio: Heart sounds are normal with regular rate and rhythm and no murmurs, rubs or gallops. Peripheral pulses are normal and equal bilaterally without edema. No aortic or femoral bruits. Chest: symmetric with normal excursions and percussion.  Abdomen: Soft, with Nl bowel sounds. Nontender, no guarding, rebound, hernias, masses, or organomegaly.  Lymphatics: Non tender without lymphadenopathy.  Musculoskeletal: Full ROM all peripheral extremities, joint stability, 5/5 strength, and normal gait. Skin: Warm and dry without rashes, lesions, cyanosis, clubbing or  ecchymosis.  Neuro: Cranial nerves intact, reflexes equal bilaterally. Normal muscle tone, no cerebellar symptoms. Sensation intact.  Pysch: Alert and oriented X 3 with normal affect, insight and judgment appropriate.   Assessment and Plan  1. Annual Preventative/Screening Exam   2. Essential hypertension  - EKG 12-Lead - [place], RETROPERITNL ABD,  LTD - Urinalysis, Routine w reflex microscopic - Microalbumin /  creatinine urine ratio - CBC with Differential/Platelet - COMPLETE METABOLIC PANEL WITH GFR - Magnesium - TSH  3. Hyperlipidemia, mixed  - EKG 12-Lead - [place], RETROPERITNL ABD,  LTD - Lipid panel - Hemoglobin A1c - Insulin, random  4. Abnormal glucose  - EKG 12-Lead - [place], RETROPERITNL ABD,  LTD - Hemoglobin A1c - Insulin, random  5. Vitamin D deficiency  - VITAMIN D 25 Hydroxy   6. Aortic atherosclerosis ([place]) by Chest CT scan Nov 2021  - EKG 12-Lead - Lipid panel  7. BPH with obstruction/lower urinary tract symptoms  - PSA  8. Prostate cancer screening  - PSA  9. Screening for colorectal cancer  - POC Hemoccult Bld/Stl  10. Screening for ischemic heart disease  - EKG 12-Lead  11. Screening for AAA (aortic abdominal aneurysm)  - [place], RETROPERITNL ABD,  LTD  12. Medication management  - Urinalysis, Routine w reflex microscopic - Microalbumin / creatinine urine ratio - Magnesium - Lipid panel - TSH - Hemoglobin A1c - Insulin, random - VITAMIN D 25 Hydroxy          Patient was counseled in prudent diet, weight control to achieve/maintain BMI less than 25, BP monitoring, regular exercise and medications as discussed.  Discussed med effects and SE's. Routine screening labs and tests as requested with regular follow-up as recommended. Over 40 minutes of exam, counseling, chart review and high complex critical decision making was performed   Kirtland Bouchard, MD

## 2022-03-25 NOTE — Patient Instructions (Signed)

## 2022-03-26 LAB — CBC WITH DIFFERENTIAL/PLATELET
Absolute Monocytes: 555 cells/uL (ref 200–950)
Basophils Absolute: 41 cells/uL (ref 0–200)
Basophils Relative: 0.7 %
Eosinophils Absolute: 100 cells/uL (ref 15–500)
Eosinophils Relative: 1.7 %
HCT: 41.1 % (ref 38.5–50.0)
Hemoglobin: 13.8 g/dL (ref 13.2–17.1)
Lymphs Abs: 909 cells/uL (ref 850–3900)
MCH: 30.7 pg (ref 27.0–33.0)
MCHC: 33.6 g/dL (ref 32.0–36.0)
MCV: 91.5 fL (ref 80.0–100.0)
MPV: 9.1 fL (ref 7.5–12.5)
Monocytes Relative: 9.4 %
Neutro Abs: 4295 cells/uL (ref 1500–7800)
Neutrophils Relative %: 72.8 %
Platelets: 237 10*3/uL (ref 140–400)
RBC: 4.49 10*6/uL (ref 4.20–5.80)
RDW: 13 % (ref 11.0–15.0)
Total Lymphocyte: 15.4 %
WBC: 5.9 10*3/uL (ref 3.8–10.8)

## 2022-03-26 LAB — COMPLETE METABOLIC PANEL WITH GFR
AG Ratio: 1.8 (calc) (ref 1.0–2.5)
ALT: 14 U/L (ref 9–46)
AST: 13 U/L (ref 10–35)
Albumin: 4 g/dL (ref 3.6–5.1)
Alkaline phosphatase (APISO): 80 U/L (ref 35–144)
BUN: 18 mg/dL (ref 7–25)
CO2: 26 mmol/L (ref 20–32)
Calcium: 9.1 mg/dL (ref 8.6–10.3)
Chloride: 109 mmol/L (ref 98–110)
Creat: 0.97 mg/dL (ref 0.70–1.22)
Globulin: 2.2 g/dL (calc) (ref 1.9–3.7)
Glucose, Bld: 132 mg/dL — ABNORMAL HIGH (ref 65–99)
Potassium: 4 mmol/L (ref 3.5–5.3)
Sodium: 143 mmol/L (ref 135–146)
Total Bilirubin: 0.5 mg/dL (ref 0.2–1.2)
Total Protein: 6.2 g/dL (ref 6.1–8.1)
eGFR: 77 mL/min/{1.73_m2} (ref >=60)

## 2022-03-26 LAB — MICROALBUMIN / CREATININE URINE RATIO
Creatinine, Urine: 108 mg/dL (ref 20–320)
Microalb Creat Ratio: 25 mcg/mg creat (ref <30)
Microalb, Ur: 2.7 mg/dL

## 2022-03-26 LAB — URINALYSIS, ROUTINE W REFLEX MICROSCOPIC
Bilirubin Urine: NEGATIVE
Glucose, UA: NEGATIVE
Hgb urine dipstick: NEGATIVE
Ketones, ur: NEGATIVE
Leukocytes,Ua: NEGATIVE
Nitrite: NEGATIVE
Protein, ur: NEGATIVE
Specific Gravity, Urine: 1.02 (ref 1.001–1.035)
pH: 5.5 (ref 5.0–8.0)

## 2022-03-26 LAB — HEMOGLOBIN A1C
Hgb A1c MFr Bld: 5.8 % of total Hgb — ABNORMAL HIGH (ref <5.7)
Mean Plasma Glucose: 120 mg/dL
eAG (mmol/L): 6.6 mmol/L

## 2022-03-26 LAB — MAGNESIUM: Magnesium: 1.8 mg/dL (ref 1.5–2.5)

## 2022-03-26 LAB — URINE CULTURE
MICRO NUMBER:: 13668299
Result:: NO GROWTH
SPECIMEN QUALITY:: ADEQUATE

## 2022-03-26 LAB — VITAMIN D 25 HYDROXY (VIT D DEFICIENCY, FRACTURES): Vit D, 25-Hydroxy: 35 ng/mL (ref 30–100)

## 2022-03-26 LAB — LIPID PANEL
Cholesterol: 160 mg/dL (ref <200)
HDL: 57 mg/dL (ref >=40)
LDL Cholesterol (Calc): 87 mg/dL (calc)
Non-HDL Cholesterol (Calc): 103 mg/dL (calc) (ref <130)
Total CHOL/HDL Ratio: 2.8 (calc) (ref <5.0)
Triglycerides: 73 mg/dL (ref <150)

## 2022-03-26 LAB — TSH: TSH: 1.57 mIU/L (ref 0.40–4.50)

## 2022-03-26 LAB — INSULIN, RANDOM: Insulin: 56.4 u[IU]/mL — ABNORMAL HIGH

## 2022-03-26 NOTE — Progress Notes (Signed)
<><><><><><><><><><><><><><><><><><><><><><><><><><><><><><><><><>  -   Urine culture had " No Growth "                                                 - ie., "No Infection " = Great   <><><><><><><><><><><><><><><><><><><><><><><><><><><><><><><><><> <><><><><><><><><><><><><><><><><><><><><><><><><><><><><><><><><>  -

## 2022-04-15 DIAGNOSIS — H2512 Age-related nuclear cataract, left eye: Secondary | ICD-10-CM | POA: Diagnosis not present

## 2022-04-15 DIAGNOSIS — H35371 Puckering of macula, right eye: Secondary | ICD-10-CM | POA: Diagnosis not present

## 2022-04-15 DIAGNOSIS — H353131 Nonexudative age-related macular degeneration, bilateral, early dry stage: Secondary | ICD-10-CM | POA: Diagnosis not present

## 2022-04-15 DIAGNOSIS — H52203 Unspecified astigmatism, bilateral: Secondary | ICD-10-CM | POA: Diagnosis not present

## 2022-05-28 ENCOUNTER — Telehealth: Payer: Self-pay | Admitting: *Deleted

## 2022-05-28 NOTE — Patient Outreach (Signed)
  Care Coordination   05/28/2022 Name: Carlos Baldwin MRN: [id number] DOB: September 16, 1934   Care Coordination Outreach Attempts:  An unsuccessful telephone outreach was attempted today to offer the patient information about available care coordination services as a benefit of their health plan.   Follow Up Plan:  Additional outreach attempts will be made to offer the patient care coordination information and services.   Encounter Outcome:  No Answer  Care Coordination Interventions Activated:  No   Care Coordination Interventions:  No, not indicated    Eduard Clos MSW, LCSW Licensed Clinical Social Worker      [phone]

## 2022-06-17 ENCOUNTER — Other Ambulatory Visit: Payer: Self-pay | Admitting: Internal Medicine

## 2022-06-17 MED ORDER — MELOXICAM 15 MG PO TABS
ORAL_TABLET | ORAL | 3 refills | Status: DC
Start: 2022-06-17 — End: 2023-01-13

## 2022-06-24 NOTE — Progress Notes (Deleted)
MEDICARE ANNUAL WELLNESS AND 3 MONTH FOLLOW UP Assessment:   Encounter for medicare annual wellness visit  Due Yearly  Aortic atherosclerosis ([place]) Via CXR 10/2019 Control blood pressure, cholesterol, glucose, increase exercise.   Essential hypertension - continue medications, DASH diet, exercise and monitor at home. Call if greater than 130/80.   Hyperlipidemia, mixed decrease fatty foods increase activity.   Overweight Recommended diet heavy in fruits and veggies and low in animal meats, cheeses, and dairy products, appropriate calorie intake Continue exercise Follow up at next visit   Pulmonary emphysema, unspecified emphysema type ([place]) Per CXR; Non smoker; improved with trellegy ellipta - given trellegy and breztri samples due to donut whole; will monitor  Pulmonary nodule CT 07/11/20 stable right middle lobe nodule Repeat CT of chest ordered for next month If stable should need no further imaging Addendum: Pt has declined the additional CT of chest  Abnormal glucose Discussed disease progression and risks Discussed diet/exercise, weight management and risk modification  Vitamin D deficiency Continue supplement  Medication management Monitor  Future Appointments  Date Time Provider [place]  06/25/2022 11:00 AM Alycia Rossetti, NP [facility] None  09/30/2022 11:30 AM Unk Pinto, MD [facility] None  12/29/2022 11:00 AM Alycia Rossetti, NP [facility] None  03/30/2023 11:00 AM Unk Pinto, MD [facility] None     Subjective:  Carlos Baldwin is a 87 y.o. male who presents for 3 month follow up for HTN, hyperlipidemia, prediabetes, and vitamin D Def.    He had + cologuard in 2020, underwent colonoscopy/polpectomy by Dr. Fuller Plan, recommended no follow up due to age.   Persistent cough, has emphysematous changes per CXR; never smoker, remote hx of asthma as a child Now on singulair, trellegy with excellent results, but currently in the  donut whole  had CT CT chest 11/06/2019 Noncalcified 7 mm pulmonary nodule within the right middle lobe. Repeat 07/2020 showed stability, recommend repeat 18-24 months from initial which is next month  He is having pain in left shoulder which occurred when he was doing bicep curls 2 days ago. He did not hear a pop but has pain in left shoulder with movement which he describes as a sharp pain.  He  can not raise arm to the front without pain. Minimal pain when raising laterally or to the back.  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 He is watching portions. 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   He denies chest pain, shortness of breath, dizziness.   He is not on cholesterol medication and denies myalgias. His cholesterol is at goal. The cholesterol last visit was:   Lab Results  Component Value Date   CHOL 160 03/25/2022   HDL 57 03/25/2022   LDLCALC 87 03/25/2022   TRIG 73 03/25/2022   CHOLHDL 2.8 03/25/2022   He has been working on diet and exercise for prediabetes, and denies paresthesia of the feet, polydipsia  and polyuria. Last A1C in the office was:   Lab Results  Component Value Date   HGBA1C 5.8 (H) 03/25/2022   Patient is on Vitamin D supplement, was off Lab Results  Component Value Date   VD25OH 35 03/25/2022   Lab Results  Component Value Date   GFRNONAA 73 09/25/2020     Medication Review: Current Outpatient Medications on File Prior to Visit  Medication Sig Dispense  Refill   CHOLECALCIFEROL PO Take 2,000 Units by mouth daily.     finasteride (PROSCAR) 5 MG tablet TAKE 1 TABLET BY MOUTH DAILY FOR PROSTATE 90 tablet 3   fluorouracil (EFUDEX) 5 % cream Apply to suspect areas 2 x / day til skin raw 40 g 1   Fluticasone-Umeclidin-Vilant (TRELEGY ELLIPTA) 200-62.5-25 MCG/ACT AEPB Use  1 inhalation  Daily (Patient not taking: Reported on 03/25/2022) 180 each 3   losartan (COZAAR) 100 MG tablet TAKE 1 TABLET BY MOUTH DAILY FOR BLOOD PRESSURE 90 tablet 3   meloxicam (MOBIC) 15 MG tablet Take  1/2 to 1 tablet  Daily  with Food for Pain & Inflammation 90 tablet 3   montelukast (SINGULAIR) 10 MG tablet TAKE 1 TABLET BY MOUTH DAILY FOR ALLERGIES OR ASTHMA 90 tablet 3   Polyethyl Glycol-Propyl Glycol (SYSTANE OP) Place 1 drop into both eyes 3 (three) times daily as needed (Dry eyes).      No current facility-administered medications on file prior to visit.   Current Problems (verified) Patient Active Problem List   Diagnosis Date Noted   CKD (chronic kidney disease) stage 2, GFR 60-89 ml/min 07/12/2020   Lung nodules 11/07/2019   Aortic atherosclerosis ([place]) by Chest CT scan Nov 2021 10/26/2019   COPD (chronic obstructive pulmonary disease) ([place]) 10/26/2019   BPH with obstruction/lower urinary tract symptoms 03/07/2019   Abnormal glucose 03/07/2019   Medication management 12/21/2013   Essential hypertension 08/02/2013   Hyperlipidemia, mixed 08/02/2013   Vitamin D deficiency 08/02/2013     Allergies Allergies  Allergen Reactions   Atenolol Other (See Comments)    "not sure"   Pravastatin     Joint pain   Simvastatin     Joint pain   Sulfa Antibiotics    Sulfonamide Derivatives Swelling and Rash    SURGICAL HISTORY He  has a past surgical history that includes Knee surgery (Right, 1972); left knee arthroscopy (01-14-2012); Total knee arthroplasty (Left, 11/04/2015); and Total knee arthroplasty (Right, 10/11/2017). FAMILY HISTORY His family history is not  on file.  SOCIAL HISTORY He  reports that he has never smoked. He has never used smokeless tobacco. He reports that he does not drink alcohol and does not use drugs.   Review of Systems  Constitutional:  Negative for malaise/fatigue and weight loss.  HENT:  Negative for hearing loss and tinnitus.   Eyes:  Negative for blurred vision and double vision.  Respiratory:  Negative for cough, shortness of breath and wheezing.   Cardiovascular:  Negative for chest pain, palpitations, orthopnea, claudication and leg swelling.  Gastrointestinal:  Negative for abdominal pain, blood in stool, constipation, diarrhea, heartburn, melena, nausea and vomiting.  Genitourinary: Negative.   Musculoskeletal:  Positive for joint pain (left shoulder). Negative for myalgias.  Skin:  Negative for rash.  Neurological:  Negative for dizziness, tingling, sensory change, weakness and headaches.  Endo/Heme/Allergies:  Negative for polydipsia.  Psychiatric/Behavioral: Negative.    All other systems reviewed and are negative.    Objective:   There were no vitals taken for  this visit. There is no height or weight on file to calculate BMI.  General appearance: alert, no distress, WD/WN, male HEENT: normocephalic, sclerae anicteric, TMs pearly, nares patent, no discharge or erythema, pharynx normal Oral cavity: MMM, no lesions Neck: supple, no lymphadenopathy, no thyromegaly, no masses Heart: RRR, distant heart sounds, normal S1, S2, no murmurs Lungs: CTA bilaterally, slight barrel chest, no wheezes, rhonchi, or rales Abdomen: +bs, soft, non tender, non distended, no masses, no hepatomegaly, no splenomegaly Musculoskeletal: nontender, no swelling, no obvious deformity Shoulder Musculoskeletal Exam  Inspection   Right     Right shoulder inspection is normal.   Left     Left shoulder inspection is normal.  Palpation   Right     Crepitus: no crepitus     Increased warmth: none     Tenderness: present        Anterior shoulder: moderate       Posterior shoulder: mild       Clavicle: none       AC joint: none       Sternoclavicular joint: none       Rotator cuff: moderate       Greater tuberosity: none       Trapezius: none       Superior pole of scapula: none       Inferior pole of scapula: none       Bicipital groove: moderate       Proximal biceps: moderate       Distal biceps: mild       Lateral arm: mild       Elbow: none   Left     Left shoulder palpation is normal.  Range of Motion   Right     Active ROM: abnormal.      Passive ROM: abnormal.    Left     Left shoulder range of motion is normal.    Extremities: no edema, no cyanosis, no clubbing Skin: left face and left posterior ear with erythematous nodule/plaques Pulses: 2+ symmetric, upper and lower extremities, normal cap refill Neurological: alert, oriented x 3, CN2-12 intact, strength normal upper extremities and lower extremities, sensation normal throughout, DTRs 2+ throughout, no cerebellar signs, gait antalgic due to knees Psychiatric: normal affect, behavior normal, pleasant    Alycia Rossetti, NP   06/24/2022

## 2022-06-25 ENCOUNTER — Ambulatory Visit: Payer: Medicare Other | Admitting: Nurse Practitioner

## 2022-06-25 DIAGNOSIS — E663 Overweight: Secondary | ICD-10-CM

## 2022-06-25 DIAGNOSIS — I10 Essential (primary) hypertension: Secondary | ICD-10-CM

## 2022-06-25 DIAGNOSIS — R7309 Other abnormal glucose: Secondary | ICD-10-CM

## 2022-06-25 DIAGNOSIS — I700 Atherosclerosis of aorta: Secondary | ICD-10-CM

## 2022-06-25 DIAGNOSIS — J439 Emphysema, unspecified: Secondary | ICD-10-CM

## 2022-06-25 DIAGNOSIS — R918 Other nonspecific abnormal finding of lung field: Secondary | ICD-10-CM

## 2022-06-25 DIAGNOSIS — Z79899 Other long term (current) drug therapy: Secondary | ICD-10-CM

## 2022-06-25 DIAGNOSIS — E782 Mixed hyperlipidemia: Secondary | ICD-10-CM

## 2022-06-25 DIAGNOSIS — E559 Vitamin D deficiency, unspecified: Secondary | ICD-10-CM

## 2022-06-25 DIAGNOSIS — Z0000 Encounter for general adult medical examination without abnormal findings: Secondary | ICD-10-CM

## 2022-06-30 ENCOUNTER — Other Ambulatory Visit: Payer: Self-pay | Admitting: Internal Medicine

## 2022-06-30 DIAGNOSIS — J4520 Mild intermittent asthma, uncomplicated: Secondary | ICD-10-CM

## 2022-07-06 NOTE — Progress Notes (Unsigned)
MEDICARE ANNUAL WELLNESS AND 3 MONTH FOLLOW UP Assessment:   Encounter for medicare annual wellness visit  Due Yearly  Aortic atherosclerosis ([place]) Via CXR 10/2019 Control blood pressure, cholesterol, glucose, increase exercise.   Essential hypertension - continue medications, DASH diet, exercise and monitor at home. Call if greater than 130/80.   Hyperlipidemia, mixed decrease fatty foods increase activity.  - CBC, CMP, Lipid  Overweight Recommended diet heavy in fruits and veggies and low in animal meats, cheeses, and dairy products, appropriate calorie intake Continue exercise Follow up at next visit   Pulmonary emphysema, unspecified emphysema type ([place]) Per CXR; Non smoker; improved with trellegy ellipta - given trellegy and breztri samples due to donut whole; will monitor  Pulmonary nodule CT 07/11/20 stable right middle lobe nodule If stable should need no further imaging Addendum: Pt has declined the additional CT of chest  Abnormal glucose Discussed disease progression and risks Discussed diet/exercise, weight management and risk modification  Vitamin D deficiency Continue supplement  Medication management - Magnesium  Future Appointments  Date Time Provider [place]  09/30/2022 11:30 AM Unk Pinto, MD [facility] None  12/29/2022 11:00 AM Alycia Rossetti, NP [facility] None  03/30/2023 11:00 AM Unk Pinto, MD [facility] None    Plan:   During the course of the visit the patient was educated and counseled about appropriate screening and preventive services including:   Pneumococcal vaccine  Prevnar 13 Influenza vaccine Td vaccine Screening electrocardiogram Bone densitometry screening Colorectal cancer screening Diabetes screening Glaucoma screening Nutrition counseling  Advanced directives: requested   Subjective:  Carlos Baldwin is a 87 y.o. male who presents for 3 month follow up for HTN, hyperlipidemia, prediabetes,  and vitamin D Def.    He had + cologuard in 2020, underwent colonoscopy/polpectomy by Dr. Fuller Plan, recommended no follow up due to age.   Persistent cough, has emphysematous changes per CXR; never smoker, remote hx of asthma as a child Now on singulair, trellegy with excellent results, but currently in the donut whole  had CT CT chest 11/06/2019 Noncalcified 7 mm pulmonary nodule within the right middle lobe. 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 He is watching portions. Wt Readings from Last 3 Encounters:  07/07/22 171 lb 6.4 oz (77.7 kg)  03/25/22 171 lb 12.8 oz (77.9 kg)  10/24/21 172 lb 3.2 oz (78.1 kg)   He has aortic atherosclerosis per CT 11/2019 His blood pressure has been controlled at home, today their BP is BP: 130/76. 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   He denies chest pain, shortness of breath, dizziness.   He is not on cholesterol medication and denies myalgias. His cholesterol is at goal. The cholesterol last visit was:   Lab Results  Component Value Date   CHOL 160 03/25/2022   HDL 57 03/25/2022   LDLCALC 87 03/25/2022   TRIG 73 03/25/2022   CHOLHDL 2.8 03/25/2022   He has been working on diet and exercise for prediabetes, and denies paresthesia of the feet, polydipsia and polyuria. Last A1C in the office was:   Lab Results  Component Value Date  HGBA1C 5.8 (H) 03/25/2022   Patient is on Vitamin D supplement, was off Lab Results  Component Value Date   VD25OH 35 03/25/2022   He is drinking more water Lab Results  Component Value Date   EGFR 77 03/25/2022      Medication Review: Current  Outpatient Medications on File Prior to Visit  Medication Sig Dispense Refill   CHOLECALCIFEROL PO Take 2,000 Units by mouth daily.     finasteride (PROSCAR) 5 MG tablet TAKE 1 TABLET BY MOUTH DAILY FOR PROSTATE 90 tablet 3   Fluticasone-Umeclidin-Vilant (TRELEGY ELLIPTA) 200-62.5-25 MCG/ACT AEPB INHALE 1 PUFF BY MOUTH DAILY 180 each 3   losartan (COZAAR) 100 MG tablet TAKE 1 TABLET BY MOUTH DAILY FOR BLOOD PRESSURE 90 tablet 3   meloxicam (MOBIC) 15 MG tablet Take  1/2 to 1 tablet  Daily  with Food for Pain & Inflammation 90 tablet 3   montelukast (SINGULAIR) 10 MG tablet TAKE 1 TABLET BY MOUTH DAILY FOR ALLERGIES OR ASTHMA 90 tablet 3   multivitamin-lutein (OCUVITE-LUTEIN) CAPS capsule Take 1 capsule by mouth daily.     Polyethyl Glycol-Propyl Glycol (SYSTANE OP) Place 1 drop into both eyes 3 (three) times daily as needed (Dry eyes).      VITAMIN E PO Take by mouth.     fluorouracil (EFUDEX) 5 % cream Apply to suspect areas 2 x / day til skin raw (Patient not taking: Reported on 07/07/2022) 40 g 1   No current facility-administered medications on file prior to visit.   Current Problems (verified) Patient Active Problem List   Diagnosis Date Noted   CKD (chronic kidney disease) stage 2, GFR 60-89 ml/min 07/12/2020   Lung nodules 11/07/2019   Aortic atherosclerosis ([place]) by Chest CT scan Nov 2021 10/26/2019   COPD (chronic obstructive pulmonary disease) ([place]) 10/26/2019   BPH with obstruction/lower urinary tract symptoms 03/07/2019   Abnormal glucose 03/07/2019   Medication management 12/21/2013   Essential hypertension 08/02/2013   Hyperlipidemia, mixed 08/02/2013   Vitamin D deficiency 08/02/2013     Allergies Allergies  Allergen Reactions   Atenolol Other (See Comments)    "not sure"   Pravastatin     Joint pain   Simvastatin     Joint pain   Sulfa Antibiotics    Sulfonamide Derivatives Swelling and Rash    SURGICAL HISTORY He  has a past surgical history that includes  Knee surgery (Right, 1972); left knee arthroscopy (01-14-2012); Total knee arthroplasty (Left, 11/04/2015); and Total knee arthroplasty (Right, 10/11/2017). FAMILY HISTORY His family history is not on file.  SOCIAL HISTORY He  reports that he has never smoked. He has never used smokeless tobacco. He reports that he does not drink alcohol and does not use drugs.  Immunization History  Administered Date(s) Administered   Influenza, High Dose Seasonal PF 06/03/2018, 05/25/2019, 05/07/2022   Influenza-Unspecified 06/06/2020   PFIZER(Purple Top)SARS-COV-2 Vaccination 11/12/2019, 12/12/2019   Pneumococcal Conjugate-13 06/27/2014   Pneumococcal Polysaccharide-23 09/07/2006   Td 03/08/2019   Tdap 09/18/2019   Zoster, Live 09/07/2008   Health Maintenance  Topic Date Due   COVID-19 Vaccine (3 - Pfizer risk series) 07/23/2022 (Originally 01/09/2020)   Zoster Vaccines- Shingrix (1 of 2) 10/07/2022 (Originally 07/27/1954)   Medicare Annual Wellness (AWV)  07/08/2023   TETANUS/TDAP  09/17/2029   Pneumonia Vaccine 60+ Years old  Completed   INFLUENZA VACCINE  Completed   HPV VACCINES  Aged Out    Eye doctor: Dentist 10/23 [place]  OBJECTIVES: Physical activity: Current Exercise Habits: Home exercise routine, Type of exercise: walking;calisthenics;strength training/weights, Time (Minutes): 50, Frequency (Times/Week): 3, Weekly Exercise (Minutes/Week): 150, Intensity: Mild, Exercise limited by: None identified Cardiac risk factors: Cardiac Risk Factors include: advanced age (>43mn, >>94women);dyslipidemia;hypertension;male gender Depression/mood screen:      07/07/2022   10:54 AM  Depression screen PHQ 2/9  Decreased Interest 0  Down, Depressed, Hopeless 0  PHQ - 2 Score 0    ADLs:     07/07/2022   10:54 AM 03/25/2022   12:18 AM  In your present state of health, do you have any difficulty performing the following activities:  Hearing? 0 0  Vision? 0 0  Difficulty  concentrating or making decisions? 0 0  Walking or climbing stairs? 0 0  Dressing or bathing? 0 0  Doing errands, shopping? 0 0     Cognitive Testing  Alert? Yes  Normal Appearance?Yes  Oriented to person? Yes  Place? Yes   Time? Yes  Recall of three objects?  Yes  Can perform simple calculations? Yes  Displays appropriate judgment?Yes  Can read the correct time from a watch face?Yes  EOL planning: Does Patient Have a Medical Advance Directive?: Yes Type of Advance Directive: Healthcare Power of Attorney, Living will Does patient want to make changes to medical advance directive?: No - Patient declined [place]?: No - copy requested    Review of Systems  Constitutional:  Negative for malaise/fatigue and weight loss.  HENT:  Negative for hearing loss and tinnitus.   Eyes:  Negative for blurred vision and double vision.  Respiratory:  Negative for cough, shortness of breath and wheezing.   Cardiovascular:  Negative for chest pain, palpitations, orthopnea, claudication and leg swelling.  Gastrointestinal:  Negative for abdominal pain, blood in stool, constipation, diarrhea, heartburn, melena, nausea and vomiting.  Genitourinary: Negative.   Musculoskeletal:  Negative for joint pain and myalgias.  Skin:  Negative for rash.  Neurological:  Negative for dizziness, tingling, sensory change, weakness and headaches.  Endo/Heme/Allergies:  Negative for polydipsia.  Psychiatric/Behavioral: Negative.    All other systems reviewed and are negative.    Objective:   Blood pressure 130/76, pulse 84, temperature 97.9 F (36.6 C), height 5' 9" (1.753 m), weight 171 lb 6.4 oz (77.7 kg), SpO2 97 %. Body mass index is 25.31 kg/m.  General appearance: alert, no distress, WD/WN, male HEENT: normocephalic, sclerae anicteric, TMs pearly, nares patent, no discharge or erythema, pharynx normal Oral cavity: MMM, no lesions Neck: supple, no lymphadenopathy, no  thyromegaly, no masses Heart: RRR, distant heart sounds, normal S1, S2, no murmurs Lungs: CTA bilaterally, slight barrel chest, no wheezes, rhonchi, or rales Abdomen: +bs, soft, non tender, non distended, no masses, no hepatomegaly, no splenomegaly Musculoskeletal: nontender, no swelling, no obvious deformity  Extremities: no edema, no cyanosis, no clubbing Skin: left face and left posterior ear with erythematous nodule/plaques Pulses: 2+ symmetric, upper and lower extremities, normal cap refill Neurological: alert, oriented x 3, CN2-12 intact, strength normal upper extremities and lower extremities, sensation normal throughout, DTRs 2+ throughout, no cerebellar signs, gait antalgic due to knees Psychiatric: normal affect, behavior normal, pleasant    Medicare Attestation I have personally reviewed: The patient's medical and social history Their use of alcohol, tobacco or illicit drugs Their current medications and supplements The patient's functional ability including ADLs,fall risks, home safety risks, cognitive, and hearing and visual impairment Diet and physical activities Evidence for depression or mood  disorders  The patient's weight, height, BMI, and visual acuity have been recorded in the chart.  I have made referrals, counseling, and provided education to the patient based on review of the above and I have provided the patient with a written personalized care plan for preventive services.    Alycia Rossetti, NP   07/07/2022

## 2022-07-07 ENCOUNTER — Encounter: Payer: Self-pay | Admitting: Nurse Practitioner

## 2022-07-07 ENCOUNTER — Ambulatory Visit (INDEPENDENT_AMBULATORY_CARE_PROVIDER_SITE_OTHER): Payer: Medicare Other | Admitting: Nurse Practitioner

## 2022-07-07 VITALS — BP 130/76 | HR 84 | Temp 97.9°F | Ht 69.0 in | Wt 171.4 lb

## 2022-07-07 DIAGNOSIS — R918 Other nonspecific abnormal finding of lung field: Secondary | ICD-10-CM | POA: Diagnosis not present

## 2022-07-07 DIAGNOSIS — E782 Mixed hyperlipidemia: Secondary | ICD-10-CM | POA: Diagnosis not present

## 2022-07-07 DIAGNOSIS — R6889 Other general symptoms and signs: Secondary | ICD-10-CM

## 2022-07-07 DIAGNOSIS — E559 Vitamin D deficiency, unspecified: Secondary | ICD-10-CM | POA: Diagnosis not present

## 2022-07-07 DIAGNOSIS — E663 Overweight: Secondary | ICD-10-CM

## 2022-07-07 DIAGNOSIS — J439 Emphysema, unspecified: Secondary | ICD-10-CM

## 2022-07-07 DIAGNOSIS — Z0000 Encounter for general adult medical examination without abnormal findings: Secondary | ICD-10-CM

## 2022-07-07 DIAGNOSIS — I10 Essential (primary) hypertension: Secondary | ICD-10-CM

## 2022-07-07 DIAGNOSIS — Z79899 Other long term (current) drug therapy: Secondary | ICD-10-CM | POA: Diagnosis not present

## 2022-07-07 DIAGNOSIS — R7309 Other abnormal glucose: Secondary | ICD-10-CM

## 2022-07-07 DIAGNOSIS — Z0001 Encounter for general adult medical examination with abnormal findings: Secondary | ICD-10-CM

## 2022-07-07 DIAGNOSIS — I700 Atherosclerosis of aorta: Secondary | ICD-10-CM

## 2022-07-07 NOTE — Patient Instructions (Signed)

## 2022-07-08 LAB — CBC WITH DIFFERENTIAL/PLATELET
Absolute Monocytes: 774 cells/uL (ref 200–950)
Basophils Absolute: 51 cells/uL (ref 0–200)
Basophils Relative: 0.8 %
Eosinophils Absolute: 179 cells/uL (ref 15–500)
Eosinophils Relative: 2.8 %
HCT: 39.9 % (ref 38.5–50.0)
Hemoglobin: 13.2 g/dL (ref 13.2–17.1)
Lymphs Abs: 1197 cells/uL (ref 850–3900)
MCH: 29.9 pg (ref 27.0–33.0)
MCHC: 33.1 g/dL (ref 32.0–36.0)
MCV: 90.3 fL (ref 80.0–100.0)
MPV: 9 fL (ref 7.5–12.5)
Monocytes Relative: 12.1 %
Neutro Abs: 4198 cells/uL (ref 1500–7800)
Neutrophils Relative %: 65.6 %
Platelets: 217 10*3/uL (ref 140–400)
RBC: 4.42 10*6/uL (ref 4.20–5.80)
RDW: 13.1 % (ref 11.0–15.0)
Total Lymphocyte: 18.7 %
WBC: 6.4 10*3/uL (ref 3.8–10.8)

## 2022-07-08 LAB — LIPID PANEL
Cholesterol: 156 mg/dL (ref <200)
HDL: 54 mg/dL (ref >=40)
LDL Cholesterol (Calc): 88 mg/dL (calc)
Non-HDL Cholesterol (Calc): 102 mg/dL (calc) (ref <130)
Total CHOL/HDL Ratio: 2.9 (calc) (ref <5.0)
Triglycerides: 62 mg/dL (ref <150)

## 2022-07-08 LAB — COMPLETE METABOLIC PANEL WITH GFR
AG Ratio: 1.9 (calc) (ref 1.0–2.5)
ALT: 13 U/L (ref 9–46)
AST: 13 U/L (ref 10–35)
Albumin: 3.9 g/dL (ref 3.6–5.1)
Alkaline phosphatase (APISO): 70 U/L (ref 35–144)
BUN: 18 mg/dL (ref 7–25)
CO2: 26 mmol/L (ref 20–32)
Calcium: 8.8 mg/dL (ref 8.6–10.3)
Chloride: 109 mmol/L (ref 98–110)
Creat: 0.89 mg/dL (ref 0.70–1.22)
Globulin: 2.1 g/dL (calc) (ref 1.9–3.7)
Glucose, Bld: 99 mg/dL (ref 65–99)
Potassium: 3.8 mmol/L (ref 3.5–5.3)
Sodium: 142 mmol/L (ref 135–146)
Total Bilirubin: 0.4 mg/dL (ref 0.2–1.2)
Total Protein: 6 g/dL — ABNORMAL LOW (ref 6.1–8.1)
eGFR: 85 mL/min/{1.73_m2} (ref >=60)

## 2022-07-08 LAB — MAGNESIUM: Magnesium: 1.7 mg/dL (ref 1.5–2.5)

## 2022-07-15 ENCOUNTER — Other Ambulatory Visit: Payer: Self-pay

## 2022-07-15 ENCOUNTER — Encounter: Payer: Self-pay | Admitting: Nurse Practitioner

## 2022-07-15 ENCOUNTER — Ambulatory Visit (INDEPENDENT_AMBULATORY_CARE_PROVIDER_SITE_OTHER): Payer: Medicare Other | Admitting: Nurse Practitioner

## 2022-07-15 VITALS — BP 146/60 | HR 99 | Temp 97.7°F | Ht 69.0 in | Wt 170.4 lb

## 2022-07-15 DIAGNOSIS — Z1152 Encounter for screening for COVID-19: Secondary | ICD-10-CM

## 2022-07-15 DIAGNOSIS — I10 Essential (primary) hypertension: Secondary | ICD-10-CM

## 2022-07-15 DIAGNOSIS — Z9109 Other allergy status, other than to drugs and biological substances: Secondary | ICD-10-CM

## 2022-07-15 LAB — POC COVID19 BINAXNOW: SARS Coronavirus 2 Ag: NEGATIVE

## 2022-07-15 MED ORDER — FLUTICASONE PROPIONATE 50 MCG/ACT NA SUSP: 2.0000 | Freq: Every day | NASAL | 2 refills | Status: DC

## 2022-07-15 MED ORDER — BENZONATATE 100 MG PO CAPS: 100.0000 mg | ORAL_CAPSULE | Freq: Four times a day (QID) | ORAL | 1 refills | Status: DC | PRN

## 2022-07-15 NOTE — Patient Instructions (Signed)
Tessalon Perles 1 tab every 6 hours for cough Flonase 2 sprays each nostril once a day  Get either generic Allegra or zyrtec from over the counter and take daily  Allergies, Adult An allergy is a condition in which the body's defense system (immune system) comes in contact with an allergen and reacts to it. An allergen is anything that causes an allergic reaction. Allergens cause the immune system to make proteins for fighting infections (antibodies). These antibodies cause cells to release chemicals called histamines that set off the symptoms of an allergic reaction. Allergies often affect the nasal passages (allergic rhinitis), eyes (allergic conjunctivitis), skin (atopic dermatitis), and stomach. Allergies can be mild, moderate, or severe. They cannot spread from person to person. Allergies can develop at any age and may be outgrown. What are the causes? This condition is caused by allergens. Common allergens include: Outdoor allergens, such as pollen, car fumes, and mold. Indoor allergens, such as dust, smoke, mold, and pet dander. Other allergens, such as foods, medicines, scents, insect bites or stings, and other skin irritants. What increases the risk? You are more likely to develop this condition if you have: Family members with allergies. Family members who have any condition that may be caused by allergens, such as asthma. This may make you more likely to have other allergies. What are the signs or symptoms? Symptoms of this condition depend on the severity of the allergy. Mild to moderate symptoms Runny nose, stuffy nose (nasal congestion), or sneezing. Itchy mouth, ears, or throat. A feeling of mucus dripping down the back of your throat (postnasal drip). Sore throat. Itchy, red, watery, or puffy eyes. Skin rash, or itchy, red, swollen areas of skin (hives). Stomach cramps or bloating. Severe symptoms Severe allergies to food, medicine, or insect bites may cause anaphylaxis,  which can be life-threatening. Symptoms include: A red (flushed) face. Wheezing or coughing. Swollen lips, tongue, or mouth. Tight or swollen throat. Chest pain or tightness, or rapid heartbeat. Trouble breathing or shortness of breath. Pain in the abdomen, vomiting, or diarrhea. Dizziness or fainting. How is this diagnosed? This condition is diagnosed based on your symptoms, your family and medical history, and a physical exam. You may also have tests, including: Skin tests to see how your skin reacts to allergens that may be causing your symptoms. Tests include: Skin prick test. For this test, an allergen is introduced to your body through a small opening in the skin. Intradermal skin test. For this test, a small amount of allergen is injected under the first layer of your skin. Patch test. For this test, a small amount of allergen is placed on your skin. The area is covered and then checked after a few days. Blood tests. A challenge test. For this test, you will eat or breathe in a small amount of allergen to see if you have an allergic reaction. You may also be asked to: Keep a food diary. This is a record of all the foods, drinks, and symptoms you have in a day. Try an elimination diet. To do this: Remove certain foods from your diet. Add those foods back one by one to find out if any foods cause an allergic reaction. How is this treated?     Treatment for allergies depends on your symptoms. Treatment may include: Cold, wet cloths (cold compresses) to soothe itching and swelling. Eye drops or nasal sprays. Nasal irrigation to help clear your mucus or keep the nasal passages moist. A humidifier to add moisture  to the air. Skin creams to treat rashes or itching. Oral antihistamines or other medicines to block the reaction or to treat inflammation. Diet changes to remove foods that cause allergies. Being exposed again and again to tiny amounts of allergens to help you build a  defense against it (tolerance). This is called immunotherapy. Examples include: Allergy shot. You receive an injection that contains an allergen. Sublingual immunotherapy. You take a small dose of allergen under your tongue. Emergency injection for anaphylaxis. You give yourself a shot using a syringe (auto-injector) that contains the amount of medicine you need. Your health care provider will teach you how to give yourself an injection. Follow these instructions at home: Medicines  Take or apply over-the-counter and prescription medicines only as told by your health care provider. Always carry your auto-injector pen if you are at risk of anaphylaxis. Give yourself an injection as told by your health care provider. Eating and drinking Follow instructions from your health care provider about eating or drinking restrictions. Drink enough fluid to keep your urine pale yellow. General instructions Wear a medical alert bracelet or necklace to let others know that you have had anaphylaxis before. Avoid known allergens whenever possible. Keep all follow-up visits as told by your health care provider. This is important. Contact a health care provider if: Your symptoms do not get better with treatment. Get help right away if: You have symptoms of anaphylaxis. These include: Swollen mouth, tongue, or throat. Pain or tightness in your chest. Trouble breathing or shortness of breath. Dizziness or fainting. Severe abdominal pain, vomiting, or diarrhea. These symptoms may represent a serious problem that is an emergency. Do not wait to see if the symptoms will go away. Get medical help right away. Call your local emergency services (911 in the [place] Do not drive yourself to the hospital. Summary Take or apply over-the-counter and prescription medicines only as told by your health care provider. Avoid known allergens when possible. Always carry your auto-injector pen if you are at risk of anaphylaxis.  Give yourself an injection as told by your health care provider. Wear a medical alert bracelet or necklace to let others know that you have had anaphylaxis before. Anaphylaxis is a life-threatening emergency. Get help right away. This information is not intended to replace advice given to you by your health care provider. Make sure you discuss any questions you have with your health care provider. Document Revised: 04/22/2020 Document Reviewed: 07/05/2019 Elsevier Patient Education  [place].

## 2022-07-15 NOTE — Progress Notes (Signed)
Assessment and Plan:  Carlos Baldwin was seen today for cough and nasal congestion.  Diagnoses and all orders for this visit:  Encounter for screening for COVID-19 -     POC COVID-19- negative  Essential hypertension - continue medications, DASH diet, exercise and monitor at home. Call if greater than 130/80.   Environmental allergies Use generic Zyrtec or Allegra daily Continue Singulair and Trelegy daily Push fluids Use Flonase 2 sprays each nostril daily and tessalon perles as needed -     fluticasone (FLONASE) 50 MCG/ACT nasal spray; Place 2 sprays into both nostrils daily. -     benzonatate (TESSALON PERLES) 100 MG capsule; Take 1 capsule (100 mg total) by mouth every 6 (six) hours as needed for cough.       Further disposition pending results of labs. Discussed med's effects and SE's.   Carlos 30 minutes of exam, counseling, chart review, and critical decision making was performed.   Future Appointments  Date Time Provider [place]  10/12/2022  9:30 AM Unk Pinto, MD [facility] None  01/13/2023  9:30 AM Alycia Rossetti, NP [facility] None  04/22/2023 11:00 AM Unk Pinto, MD [facility] None    ------------------------------------------------------------------------------------------------------------------   HPI BP (!) 146/60   Pulse 99   Temp 97.7 F (36.5 C)   Ht '5\' 9"'$  (1.753 m)   Wt 170 lb 6.4 oz (77.3 kg)   SpO2 97%   BMI 25.16 kg/m   Carlos Baldwin presents for nonproductive cough that has been present for several days.  Denies sinus congestion, body aches, fevers, nausea, vomiting and diarrhea.  He is currently taking Singulair and Trelegy daily.   He is currently on Losartan '100mg'$  QD for BP control.  He denies headaches, chest pain, shortness of breath and dizziness.  BP Readings from Last 3 Encounters:  07/15/22 (!) 146/60  07/07/22 130/76  03/25/22 136/74    Past Medical History:  Diagnosis Date   Arthritis    oa   Asthma    as child    Bladder neck obstruction 08/02/2013   Colon polyps    Hyperlipidemia    Hypertension    Measles    as child   Mumps    as child   Positive colorectal cancer screening using Cologuard test 07/17/2019   Pre-diabetes      Allergies  Allergen Reactions   Atenolol Other (See Comments)    "not sure"   Pravastatin     Joint pain   Simvastatin     Joint pain   Sulfa Antibiotics    Sulfonamide Derivatives Swelling and Rash    Current Outpatient Medications on File Prior to Visit  Medication Sig   CHOLECALCIFEROL PO Take 2,000 Units by mouth daily.   finasteride (PROSCAR) 5 MG tablet TAKE 1 TABLET BY MOUTH DAILY FOR PROSTATE   Fluticasone-Umeclidin-Vilant (TRELEGY ELLIPTA) 200-62.5-25 MCG/ACT AEPB INHALE 1 PUFF BY MOUTH DAILY   losartan (COZAAR) 100 MG tablet TAKE 1 TABLET BY MOUTH DAILY FOR BLOOD PRESSURE   meloxicam (MOBIC) 15 MG tablet Take  1/2 to 1 tablet  Daily  with Food for Pain & Inflammation   montelukast (SINGULAIR) 10 MG tablet TAKE 1 TABLET BY MOUTH DAILY FOR ALLERGIES OR ASTHMA   multivitamin-lutein (OCUVITE-LUTEIN) CAPS capsule Take 1 capsule by mouth daily.   Polyethyl Glycol-Propyl Glycol (SYSTANE OP) Place 1 drop into both eyes 3 (three) times daily as needed (Dry eyes).    VITAMIN E PO Take by mouth.   No current facility-administered medications  on file prior to visit.    ROS: all negative except above.   Physical Exam:  BP (!) 146/60   Pulse 99   Temp 97.7 F (36.5 C)   Ht '5\' 9"'$  (1.753 m)   Wt 170 lb 6.4 oz (77.3 kg)   SpO2 97%   BMI 25.16 kg/m   General Appearance: Well nourished, in no apparent distress. Eyes: PERRLA, EOMs, conjunctiva no swelling or erythema Sinuses: No Frontal/maxillary tenderness ENT/Mouth: Ext aud canals clear, TMs without erythema, bulging. No erythema, swelling, or exudate on post pharynx.  Tonsils not swollen or erythematous. Hearing normal. Nasal mucosa pale  Neck: Supple, thyroid normal.  Respiratory: Respiratory  effort normal, BS equal bilaterally without rales, rhonchi, wheezing or stridor.  Cardio: RRR with no MRGs. Brisk peripheral pulses without edema.  Abdomen: Soft, + BS.  Non tender, no guarding, rebound, hernias, masses. Lymphatics: Non tender without lymphadenopathy.  Musculoskeletal: Full ROM, 5/5 strength, normal gait.  Skin: Warm, dry without rashes, lesions, ecchymosis.  Neuro: Cranial nerves intact. Normal muscle tone, no cerebellar symptoms. Sensation intact.  Psych: Awake and oriented X 3, normal affect, Insight and Judgment appropriate.     Alycia Rossetti, NP 2:36 PM [facility]

## 2022-08-08 ENCOUNTER — Other Ambulatory Visit: Payer: Self-pay | Admitting: Internal Medicine

## 2022-08-08 DIAGNOSIS — J4520 Mild intermittent asthma, uncomplicated: Secondary | ICD-10-CM

## 2022-08-08 MED ORDER — MONTELUKAST SODIUM 10 MG PO TABS: ORAL_TABLET | ORAL | 3 refills | Status: DC

## 2022-08-15 ENCOUNTER — Other Ambulatory Visit: Payer: Self-pay | Admitting: Nurse Practitioner

## 2022-08-15 DIAGNOSIS — I10 Essential (primary) hypertension: Secondary | ICD-10-CM

## 2022-08-17 ENCOUNTER — Telehealth: Payer: Self-pay

## 2022-08-17 NOTE — Patient Outreach (Signed)
  Care Coordination   08/17/2022 Name: RED MANDT MRN: [id number] DOB: Feb 24, 1935   Care Coordination Outreach Attempts:  A second unsuccessful outreach was attempted today to offer the patient with information about available care coordination services as a benefit of their health plan.     Follow Up Plan:  Additional outreach attempts will be made to offer the patient care coordination information and services.   Encounter Outcome:  No Answer   Care Coordination Interventions:  No, not indicated    Jone Baseman, RN, MSN [place] [facility] Care Management Coordinator Direct Line [phone]

## 2022-09-03 ENCOUNTER — Telehealth: Payer: Self-pay

## 2022-09-03 NOTE — Patient Outreach (Signed)
  Care Coordination   09/03/2022 Name: Carlos Baldwin MRN: [id number] DOB: 1935-06-25   Care Coordination Outreach Attempts:  A third unsuccessful outreach was attempted today to offer the patient with information about available care coordination services as a benefit of their health plan.   Follow Up Plan:  No further outreach attempts will be made at this time. We have been unable to contact the patient to offer or enroll patient in care coordination services  Encounter Outcome:  No Answer   Care Coordination Interventions:  No, not indicated    Jone Baseman, RN, MSN [place] [facility] Care Management Coordinator Direct Line [phone]

## 2022-09-30 ENCOUNTER — Ambulatory Visit: Payer: Medicare Other | Admitting: Internal Medicine

## 2022-10-11 NOTE — Progress Notes (Unsigned)
Future Appointments  Date Time Provider Department  10/12/2022                  6 mo ov  9:30 AM Unk Pinto, MD [facility]  01/13/2023                  wellness  9:30 AM Alycia Rossetti, NP [facility]  04/22/2023                 cpe 11:00 AM Unk Pinto, MD [facility]    History of Present Illness:       This very nice 88 y.o.   MWM presents for 6 month follow up with HTN, HLD, Pre-Diabetes and Vitamin D Deficiency. Patient was noted  to have Aortic Atherosclerosis by Chest CT scan in 2015.        Patient is treated for HTN  (2003) & BP has been controlled at home. Today's BP was initially slightly elevated & rechecked at goal - 134/86 . Patient has had no complaints of any cardiac type chest pain, palpitations, dyspnea Vertell Limber /PND, dizziness, claudication or dependent edema.        Hyperlipidemia is controlled with diet & meds. Patient denies myalgias or other med SE's. Last Lipids were at goal :   Lab Results  Component Value Date   CHOL 156 07/07/2022   HDL 54 07/07/2022   LDLCALC 88 07/07/2022   TRIG 62 07/07/2022   CHOLHDL 2.9 07/07/2022     Also, the patient has history of PreDiabetes (A1c 6.1% /2011) and has had no symptoms of reactive hypoglycemia, diabetic polys, paresthesias or visual blurring.  Last A1c was near goal :  Lab Results  Component Value Date   HGBA1C 5.8 (H) 03/25/2022                                                          Further, the patient also has history of Vitamin D Deficiency ("35" /2008) and supplements vitamin D without any suspected side-effects. Last vitamin D was not  at goal:  Lab Results  Component Value Date   VD25OH 35 03/25/2022       Current Outpatient Medications  Medication Instructions   CHOLECALCIFEROL PO 2,000 Units, Oral, Daily   finasteride  5 MG tablet TAKE 1 TABLET DAILY   fFLONASE  nasal spray 2 sprays, Each Nare, Daily   TRELEGY ELLIPTA  200 INHALE 1 PUFF DAILY   losartan 100 MG tablet TAKE  1 TABLET  DAILY    meloxicam 15 MG tablet Take  1/2 to 1 tablet  Daily     montelukast 10 MG tablet Take  1 tablet  Daily    OCUVITE-LUTEIN 1 capsule Daily   SYSTANE 1 drop, Both Eyes, 3 times daily PRN   VITAMIN E daily     Allergies  Allergen Reactions   Atenolol Other (See Comments)    "not sure"   Pravastatin     Joint pain   Simvastatin     Joint pain   Sulfa Antibiotics    Sulfonamide Derivatives Swelling and Rash     PMHx:   Past Medical History:  Diagnosis Date   Arthritis    oa   Asthma    as child   Bladder neck obstruction 08/02/2013  Colon polyps    Hyperlipidemia    Hypertension    Measles    as child   Mumps    as child   Positive colorectal cancer screening using Cologuard test 07/17/2019   Pre-diabetes      Immunization History  Administered Date(s) Administered   Influenza, High Dose Seasonal PF 06/03/2018, 05/25/2019   Influenza-Unspecified 06/06/2020   PFIZER(Purple Top)SARS-COV-2 Vaccination 11/12/2019, 12/12/2019   Pneumococcal Conjugate-13 06/27/2014   Pneumococcal Polysaccharide-23 09/07/2006   Td 03/08/2019   Tdap 09/18/2019   Zoster 09/07/2008    Past Surgical History:  Procedure Laterality Date   KNEE SURGERY Right 1972   cartlidge repair   left knee arthroscopy  01-14-2012   TOTAL KNEE ARTHROPLASTY Left 11/04/2015   Procedure: LEFT TOTAL KNEE ARTHROPLASTY;  Surgeon: Gaynelle Arabian, MD;  Location: [facility];  Service: Orthopedics;  Laterality: Left;   TOTAL KNEE ARTHROPLASTY Right 10/11/2017   Procedure: RIGHT TOTAL KNEE ARTHROPLASTY;  Surgeon: Gaynelle Arabian, MD;  Location: [facility];  Service: Orthopedics;  Laterality: Right;    FHx:    Reviewed / unchanged  SHx:    Reviewed / unchanged   Systems Review:  Constitutional: Denies fever, chills, wt changes, headaches, insomnia, fatigue, night sweats, change in appetite. Eyes: Denies redness, blurred vision, diplopia, discharge, itchy, watery eyes.  ENT: Denies discharge,  congestion, post nasal drip, epistaxis, sore throat, earache, hearing loss, dental pain, tinnitus, vertigo, sinus pain, snoring.  CV: Denies chest pain, palpitations, irregular heartbeat, syncope, dyspnea, diaphoresis, orthopnea, PND, claudication or edema. Respiratory: denies cough, dyspnea, DOE, pleurisy, hoarseness, laryngitis, wheezing.  Gastrointestinal: Denies dysphagia, odynophagia, heartburn, reflux, water brash, abdominal pain or cramps, nausea, vomiting, bloating, diarrhea, constipation, hematemesis, melena, hematochezia  or hemorrhoids. Genitourinary: Denies dysuria, frequency, urgency, nocturia, hesitancy, discharge, hematuria or flank pain. Musculoskeletal: Denies arthralgias, myalgias, stiffness, jt. swelling, pain, limping or strain/sprain.  Skin: Denies pruritus, rash, hives, warts, acne, eczema or change in skin lesion(s). Neuro: No weakness, tremor, incoordination, spasms, paresthesia or pain. Psychiatric: Denies confusion, memory loss or sensory loss. Endo: Denies change in weight, skin or hair change.  Heme/Lymph: No excessive bleeding, bruising or enlarged lymph nodes.  Physical Exam  BP 134/86   Pulse 87   Temp 97.7 F (36.5 C)   Ht '5\' 9"'$  (1.753 m)   Wt 174 lb 6.4 oz (79.1 kg)   SpO2 99%   BMI 25.75 kg/m   Appears  well nourished, well groomed - much younger than chronologic age and in no distress.  Eyes: PERRLA, EOMs, conjunctiva no swelling or erythema. Sinuses: No frontal/maxillary tenderness ENT/Mouth: EAC's clear, TM's nl w/o erythema, bulging. Nares clear w/o erythema, swelling, exudates. Oropharynx clear without erythema or exudates. Oral hygiene is good. Tongue normal, non obstructing. Hearing intact.  Neck: Supple. Thyroid not palpable. Car 2+/2+ without bruits, nodes or JVD. Chest: Respirations nl with BS clear & equal w/o rales, rhonchi, wheezing or stridor.  Cor: Heart sounds normal w/ regular rate and rhythm without sig. murmurs, gallops, clicks or  rubs. Peripheral pulses normal and equal  without edema.  Abdomen: Soft & bowel sounds normal. Non-tender w/o guarding, rebound, hernias, masses or organomegaly.  Lymphatics: Unremarkable.  Musculoskeletal: Full ROM all peripheral extremities, joint stability, 5/5 strength and normal gait.  Skin: Warm, dry without exposed rashes, lesions or ecchymosis apparent.  Neuro: Cranial nerves intact, reflexes equal bilaterally. Sensory-motor testing grossly intact. Tendon reflexes grossly intact.  Pysch: Alert & oriented x 3.  Insight and judgement nl & appropriate. No ideations.  Assessment and Plan:  1. Essential hypertension  - Continue medication, monitor blood pressure at home.  - Continue DASH diet.  Reminder to go to the ER if any CP,  SOB, nausea, dizziness, severe HA, changes vision/speech.  - CBC with Differential/Platelet - COMPLETE METABOLIC PANEL WITH GFR - Magnesium - TSH  2. Hyperlipidemia, mixed  - Continue diet/meds, exercise,& lifestyle modifications.  - Continue monitor periodic cholesterol/liver & renal functions   - Lipid panel - TSH  3. Abnormal glucose  - Continue diet, exercise  - Lifestyle modifications.  - Monitor appropriate labs.  - Hemoglobin A1c - Insulin, random  4. Vitamin D deficiency  - Continue supplementation.  - VITAMIN D 25 Hydroxy   5. Medication management  - CBC with Differential/Platelet - COMPLETE METABOLIC PANEL WITH GFR - Magnesium - Lipid panel - TSH - Hemoglobin A1c - Insulin, random - VITAMIN D 25 Hydroxy        Discussed  regular exercise, BP monitoring, weight control to achieve/maintain BMI less than 25 and discussed med and SE's. Recommended labs to assess and monitor clinical status with further disposition pending results of labs.  I discussed the assessment and treatment plan with the patient. The patient was provided an opportunity to ask questions and all were answered. The patient agreed with the plan and  demonstrated an understanding of the instructions.  I provided over 30 minutes of exam, counseling, chart review and  complex critical decision making.        The patient was advised to call back or seek an in-person evaluation if the symptoms worsen or if the condition fails to improve as anticipated.   Kirtland Bouchard, MD

## 2022-10-12 ENCOUNTER — Encounter: Payer: Self-pay | Admitting: Internal Medicine

## 2022-10-12 ENCOUNTER — Ambulatory Visit (INDEPENDENT_AMBULATORY_CARE_PROVIDER_SITE_OTHER): Payer: Medicare Other | Admitting: Internal Medicine

## 2022-10-12 VITALS — BP 134/86 | HR 87 | Temp 97.7°F | Ht 69.0 in | Wt 174.4 lb

## 2022-10-12 DIAGNOSIS — I700 Atherosclerosis of aorta: Secondary | ICD-10-CM | POA: Diagnosis not present

## 2022-10-12 DIAGNOSIS — Z79899 Other long term (current) drug therapy: Secondary | ICD-10-CM | POA: Diagnosis not present

## 2022-10-12 DIAGNOSIS — E559 Vitamin D deficiency, unspecified: Secondary | ICD-10-CM | POA: Diagnosis not present

## 2022-10-12 DIAGNOSIS — I10 Essential (primary) hypertension: Secondary | ICD-10-CM

## 2022-10-12 DIAGNOSIS — R7309 Other abnormal glucose: Secondary | ICD-10-CM | POA: Diagnosis not present

## 2022-10-12 DIAGNOSIS — E782 Mixed hyperlipidemia: Secondary | ICD-10-CM

## 2022-10-12 NOTE — Patient Instructions (Signed)

## 2022-10-13 ENCOUNTER — Other Ambulatory Visit: Payer: Self-pay | Admitting: Internal Medicine

## 2022-10-13 LAB — HEMOGLOBIN A1C
Hgb A1c MFr Bld: 6.2 % of total Hgb — ABNORMAL HIGH (ref <5.7)
Mean Plasma Glucose: 131 mg/dL
eAG (mmol/L): 7.3 mmol/L

## 2022-10-13 LAB — CBC WITH DIFFERENTIAL/PLATELET
Absolute Monocytes: 803 cells/uL (ref 200–950)
Basophils Absolute: 60 cells/uL (ref 0–200)
Basophils Relative: 0.8 %
Eosinophils Absolute: 143 cells/uL (ref 15–500)
Eosinophils Relative: 1.9 %
HCT: 39.6 % (ref 38.5–50.0)
Hemoglobin: 13.6 g/dL (ref 13.2–17.1)
Lymphs Abs: 1395 cells/uL (ref 850–3900)
MCH: 30.3 pg (ref 27.0–33.0)
MCHC: 34.3 g/dL (ref 32.0–36.0)
MCV: 88.2 fL (ref 80.0–100.0)
MPV: 8.8 fL (ref 7.5–12.5)
Monocytes Relative: 10.7 %
Neutro Abs: 5100 cells/uL (ref 1500–7800)
Neutrophils Relative %: 68 %
Platelets: 259 10*3/uL (ref 140–400)
RBC: 4.49 10*6/uL (ref 4.20–5.80)
RDW: 13.1 % (ref 11.0–15.0)
Total Lymphocyte: 18.6 %
WBC: 7.5 10*3/uL (ref 3.8–10.8)

## 2022-10-13 LAB — COMPLETE METABOLIC PANEL WITH GFR
AG Ratio: 1.8 (calc) (ref 1.0–2.5)
ALT: 14 U/L (ref 9–46)
AST: 15 U/L (ref 10–35)
Albumin: 3.9 g/dL (ref 3.6–5.1)
Alkaline phosphatase (APISO): 72 U/L (ref 35–144)
BUN: 16 mg/dL (ref 7–25)
CO2: 29 mmol/L (ref 20–32)
Calcium: 9 mg/dL (ref 8.6–10.3)
Chloride: 106 mmol/L (ref 98–110)
Creat: 0.9 mg/dL (ref 0.70–1.22)
Globulin: 2.2 g/dL (calc) (ref 1.9–3.7)
Glucose, Bld: 94 mg/dL (ref 65–99)
Potassium: 4.1 mmol/L (ref 3.5–5.3)
Sodium: 143 mmol/L (ref 135–146)
Total Bilirubin: 0.4 mg/dL (ref 0.2–1.2)
Total Protein: 6.1 g/dL (ref 6.1–8.1)
eGFR: 84 mL/min/{1.73_m2} (ref >=60)

## 2022-10-13 LAB — VITAMIN D 25 HYDROXY (VIT D DEFICIENCY, FRACTURES): Vit D, 25-Hydroxy: 44 ng/mL (ref 30–100)

## 2022-10-13 LAB — MAGNESIUM: Magnesium: 1.9 mg/dL (ref 1.5–2.5)

## 2022-10-13 LAB — LIPID PANEL
Cholesterol: 148 mg/dL (ref <200)
HDL: 56 mg/dL (ref >=40)
LDL Cholesterol (Calc): 77 mg/dL (calc)
Non-HDL Cholesterol (Calc): 92 mg/dL (calc) (ref <130)
Total CHOL/HDL Ratio: 2.6 (calc) (ref <5.0)
Triglycerides: 70 mg/dL (ref <150)

## 2022-10-13 LAB — TSH: TSH: 1.5 mIU/L (ref 0.40–4.50)

## 2022-10-13 LAB — INSULIN, RANDOM: Insulin: 8.9 u[IU]/mL

## 2022-10-13 NOTE — Progress Notes (Signed)
<><><><><><><><><><><><><><><><><><><><><><><><><><><><><><><><><> <><><><><><><><><><><><><><><><><><><><><><><><><><><><><><><><><> -   Test results slightly outside the reference range are not unusual. If there is anything important, I will review this with you,  otherwise it is considered normal test values.  If you have further questions,  please do not hesitate to contact me at the office or via My Chart.  <><><><><><><><><><><><><><><><><><><><><><><><><><><><><><><><><> <><><><><><><><><><><><><><><><><><><><><><><><><><><><><><><><><>  -  A1c = 6.2% - is up a little more in the pre or early Diabetic Range .   So it's very important that you work a little harder on the diet & weight    - Avoid Sweets, Candy & White Stuff   - White Rice, White Potatoes, White Flour  - Breads &  Pasta  - So you won't end up on Diabetic meds  !  <><><><><><><><><><><><><><><><><><><><><><><><><><><><><><><><><>  -  Chol = 148  & LDL = 77   - Both   Excellent ! <><><><><><><><><><><><><><><><><><><><><><><><><><><><><><><><><>  -  Vitamin D = 44 is very Low  !   - Vitamin D goal is between 70-100.   =+=+=+=+=+=+=+=+=+=+=+=+=+=+=+=+=+=+=+=+=+=+=+=+=  - Please INCREASE your Vitamin D up to 6,000 units /day   =+=+=+=+=+=+=+=+=+=+=+=+=+=+=+=+=+=+=+=+=+=+=+=+=  - It is very important as a natural anti-inflammatory and helping the  immune system protect against viral infections, like the Covid-19    helping hair, skin, and nails, as well as reducing stroke and  heart attack risk.   - It helps your bones and helps with mood.  - It also decreases numerous cancer risks so please  take it as directed.   - Low Vit D is associated with a 200-300% higher risk for  CANCER   and 200-300% higher risk for HEART   ATTACK  &  STROKE.    - It is also associated with higher death rate at younger ages,   autoimmune diseases like Rheumatoid arthritis, Lupus,  Multiple Sclerosis.     - Also many  other serious conditions, like depression, Alzheimer's  Dementia, infertility, muscle aches, fatigue, fibromyalgia  <><><><><><><><><><><><><><><><><><><><><><><><><><><><><><><><><>  -  All Else - CBC - Kidneys - Electrolytes - Liver - Magnesium & Thyroid    - all  Normal / OK  <><><><><><><><><><><><><><><><><><><><><><><><><><><><><><><><><> <><><><><><><><><><><><><><><><><><><><><><><><><><><><><><><><><>

## 2022-10-14 ENCOUNTER — Other Ambulatory Visit: Payer: Self-pay

## 2022-10-14 MED ORDER — FINASTERIDE 5 MG PO TABS: ORAL_TABLET | ORAL | 3 refills | Status: DC

## 2022-12-29 ENCOUNTER — Ambulatory Visit: Payer: Medicare Other | Admitting: Nurse Practitioner

## 2023-01-12 NOTE — Progress Notes (Unsigned)
MEDICARE ANNUAL WELLNESS AND 3 MONTH FOLLOW UP Assessment:   Encounter for medicare annual wellness visit  Due Yearly  Aortic atherosclerosis (HCC) Via CXR 10/2019 Control blood pressure, cholesterol, glucose, increase exercise.   Essential hypertension - continue medications, DASH diet, exercise and monitor at home. Call if greater than 130/80.   Hyperlipidemia, mixed/ Statin myopathy decrease fatty foods increase activity.  - CBC, CMP, Lipid  Overweight Recommended diet heavy in fruits and veggies and low in animal meats, cheeses, and dairy products, appropriate calorie intake Continue exercise Follow up at next visit   Pulmonary emphysema, unspecified emphysema type (HCC) Per CXR; Non smoker; improved with trellegy ellipta   Pulmonary nodule CT 07/11/20 stable right middle lobe nodule If stable should need no further imaging Addendum: Pt has declined the additional CT of chest  Abnormal glucose Discussed disease progression and risks Discussed diet/exercise, weight management and risk modification  Vitamin D deficiency Continue supplement  Medication management - Magnesium  Lesion face Pt declines any evaluation of area  Future Appointments  Date Time Provider Department Center  04/22/2023 11:00 AM Lucky Cowboy, MD [facility] None  01/13/2024  9:00 AM Raynelle Dick, NP [facility] None    Plan:   During the course of the visit the patient was educated and counseled about appropriate screening and preventive services including:   Pneumococcal vaccine  Prevnar 13 Influenza vaccine Td vaccine Screening electrocardiogram Bone densitometry screening Colorectal cancer screening Diabetes screening Glaucoma screening Nutrition counseling  Advanced directives: requested   Subjective:  Carlos Baldwin is a 88 y.o. male who presents for 3 month follow up for HTN, hyperlipidemia, prediabetes, and vitamin D Def.    He had + cologuard in 2020,  underwent colonoscopy/polpectomy by Dr. Russella Dar, recommended no follow up due to age.   Persistent cough, has emphysematous changes per CXR; never smoker, remote hx of asthma as a child Now on trellegy with excellent results Had CT chest 11/06/2019 Noncalcified 7 mm pulmonary nodule within the right middle lobe. 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 He is watching portions. Wt Readings from Last 3 Encounters:  01/13/23 171 lb (77.6 kg)  10/12/22 174 lb 6.4 oz (79.1 kg)  07/15/22 170 lb 6.4 oz (77.3 kg)   He has aortic atherosclerosis per CT 11/2019 His blood pressure has been controlled at home on Losartan 100 mg QD, today their BP is BP: 132/70. He does have white coat hypertension BP Readings from Last 3 Encounters:  01/13/23 132/70  10/12/22 134/86  07/15/22 (!) 146/60    He does workout, he rides bike at home, has retired from police work.   He denies chest pain, shortness of breath, dizziness.   He is not on cholesterol medication. Has a history of statin myopathy. His cholesterol is at goal. The cholesterol last visit was:   Lab Results  Component Value Date   CHOL 148 10/12/2022   HDL 56 10/12/2022   LDLCALC 77 10/12/2022   TRIG 70 10/12/2022   CHOLHDL 2.6 10/12/2022   He has been working on diet and exercise for prediabetes, and denies paresthesia of the feet, polydipsia and polyuria. Last A1C in the office was:   Lab Results  Component Value Date   HGBA1C 6.2 (H) 10/12/2022   Patient is on Vitamin D supplement,  was off Lab Results  Component Value Date   VD25OH 1 10/12/2022   He is drinking more water Lab Results  Component Value Date   EGFR 84 10/12/2022      Medication Review: Current Outpatient Medications on File Prior to Visit  Medication Sig Dispense Refill    Ascorbic Acid (VITAMIN C) 500 MG CAPS Vitamin C     CHOLECALCIFEROL PO Take 2,000 Units by mouth daily.     diclofenac Sodium (VOLTAREN) 1 % GEL APPLY 2 GRAMS TOPICALLY TO THE AFFECTED AREA FOUR TIMES DAILY AS NEEDED FOR PAIN     finasteride (PROSCAR) 5 MG tablet TAKE 1 TABLET BY MOUTH DAILY FOR PROSTATE 90 tablet 3   Fluticasone-Umeclidin-Vilant (TRELEGY ELLIPTA) 200-62.5-25 MCG/ACT AEPB INHALE 1 PUFF BY MOUTH DAILY 180 each 3   losartan (COZAAR) 100 MG tablet TAKE 1 TABLET BY MOUTH DAILY FOR BLOOD PRESSURE 90 tablet 3   multivitamin-lutein (OCUVITE-LUTEIN) CAPS capsule Take 1 capsule by mouth daily.     Polyethyl Glycol-Propyl Glycol (SYSTANE OP) Place 1 drop into both eyes 3 (three) times daily as needed (Dry eyes).      VITAMIN E PO Take by mouth.     benzonatate (TESSALON PERLES) 100 MG capsule Take 1 capsule (100 mg total) by mouth every 6 (six) hours as needed for cough. (Patient not taking: Reported on 01/13/2023) 30 capsule 1   meloxicam (MOBIC) 15 MG tablet Take  1/2 to 1 tablet  Daily  with Food for Pain & Inflammation (Patient not taking: Reported on 01/13/2023) 90 tablet 3   No current facility-administered medications on file prior to visit.   Current Problems (verified) Patient Active Problem List   Diagnosis Date Noted   CKD (chronic kidney disease) stage 2, GFR 60-89 ml/min 07/12/2020   Lung nodules 11/07/2019   Aortic atherosclerosis (HCC) by Chest CT scan Nov 2021 10/26/2019   COPD (chronic obstructive pulmonary disease) (HCC) 10/26/2019   BPH with obstruction/lower urinary tract symptoms 03/07/2019   Abnormal glucose 03/07/2019   Medication management 12/21/2013   Essential hypertension 08/02/2013   Hyperlipidemia, mixed 08/02/2013   Vitamin D deficiency 08/02/2013     Allergies Allergies  Allergen Reactions   Atenolol Other (See Comments)    "not sure"   Pravastatin     Joint pain   Simvastatin     Joint pain   Sulfa Antibiotics    Sulfonamide Derivatives  Swelling and Rash    SURGICAL HISTORY He  has a past surgical history that includes Knee surgery (Right, 1972); left knee arthroscopy (01-14-2012); Total knee arthroplasty (Left, 11/04/2015); and Total knee arthroplasty (Right, 10/11/2017). FAMILY HISTORY His family history is not on file.  SOCIAL HISTORY He  reports that he has never smoked. He has never used smokeless tobacco. He reports that he does not drink alcohol and does not use drugs.  Immunization History  Administered Date(s) Administered   Influenza, High Dose Seasonal PF 06/03/2018, 05/25/2019, 05/07/2022   Influenza-Unspecified 06/06/2020   PFIZER(Purple Top)SARS-COV-2 Vaccination 11/12/2019, 12/12/2019   Pneumococcal Conjugate-13 06/27/2014   Pneumococcal Polysaccharide-23 09/07/2006   Td 03/08/2019   Tdap 09/18/2019   Zoster, Live 09/07/2008   Health Maintenance  Topic Date Due   COVID-19 Vaccine (3 - Pfizer risk series) 01/29/2023 (Originally 01/09/2020)   Zoster Vaccines- Shingrix (1 of 2) 04/15/2023 (Originally 07/27/1954)   INFLUENZA VACCINE  04/08/2023   Medicare Annual Wellness (AWV)  01/13/2024   DTaP/Tdap/Td (3 - Td or Tdap) 09/17/2029   Pneumonia Vaccine 65+ Years  old  Completed   HPV VACCINES  Aged Out    Eye doctor:[facility] 2024 Dentist 12/2022 Leanord Asal   MEDICARE WELLNESS OBJECTIVES: Physical activity: Current Exercise Habits: Home exercise routine, Type of exercise: walking, Time (Minutes): 30, Frequency (Times/Week): 5, Weekly Exercise (Minutes/Week): 150, Intensity: Mild, Exercise limited by: orthopedic condition(s) Cardiac risk factors: Cardiac Risk Factors include: advanced age (>20men, >4 women);dyslipidemia;hypertension;male gender Depression/mood screen:      01/13/2023    9:45 AM  Depression screen PHQ 2/9  Decreased Interest 0  Down, Depressed, Hopeless 0  PHQ - 2 Score 0    ADLs:     01/13/2023    9:45 AM 10/12/2022    7:11 PM  In your present state of health, do you have any difficulty  performing the following activities:  Hearing? 0 0  Vision? 0 0  Difficulty concentrating or making decisions? 0 0  Walking or climbing stairs? 0 0  Dressing or bathing? 0 0  Doing errands, shopping? 0 0     Cognitive Testing  Alert? Yes  Normal Appearance?Yes  Oriented to person? Yes  Place? Yes   Time? Yes  Recall of three objects?  Yes  Can perform simple calculations? Yes  Displays appropriate judgment?Yes  Can read the correct time from a watch face?Yes  EOL planning: Does Patient Have a Medical Advance Directive?: Yes Does patient want to make changes to medical advance directive?: No - Patient declined    Review of Systems  Constitutional:  Negative for malaise/fatigue and weight loss.  HENT:  Negative for hearing loss and tinnitus.   Eyes:  Negative for blurred vision and double vision.  Respiratory:  Negative for cough, shortness of breath and wheezing.   Cardiovascular:  Negative for chest pain, palpitations, orthopnea, claudication and leg swelling.  Gastrointestinal:  Negative for abdominal pain, blood in stool, constipation, diarrhea, heartburn, melena, nausea and vomiting.  Genitourinary: Negative.   Musculoskeletal:  Positive for joint pain (occ knee pain). Negative for myalgias.  Skin:  Negative for rash.  Neurological:  Negative for dizziness, tingling, sensory change, weakness and headaches.  Endo/Heme/Allergies:  Negative for polydipsia.  Psychiatric/Behavioral: Negative.    All other systems reviewed and are negative.    Objective:   Blood pressure 132/70, pulse 100, temperature (!) 97.5 F (36.4 C), height 5\' 9"  (1.753 m), weight 171 lb (77.6 kg), SpO2 97 %. Body mass index is 25.25 kg/m.  General appearance: alert, no distress, WD/WN, male HEENT: normocephalic, sclerae anicteric, TMs pearly, nares patent, no discharge or erythema, pharynx normal Oral cavity: MMM, no lesions Neck: supple, no lymphadenopathy, no thyromegaly, no masses Heart: RRR,  distant heart sounds, normal S1, S2, no murmurs Lungs: CTA bilaterally, slight barrel chest, no wheezes, rhonchi, or rales Abdomen: +bs, soft, non tender, non distended, no masses, no hepatomegaly, no splenomegaly Musculoskeletal: nontender, no swelling, no obvious deformity  Extremities: no edema, no cyanosis, no clubbing Skin: behind left ear 1-2 cm dark raised area Pulses: 2+ symmetric, upper and lower extremities, normal cap refill Neurological: alert, oriented x 3, CN2-12 intact, strength normal upper extremities and lower extremities, sensation normal throughout, DTRs 2+ throughout, no cerebellar signs, gait antalgic due to knees Psychiatric: normal affect, behavior normal, pleasant    Medicare Attestation I have personally reviewed: The patient's medical and social history Their use of alcohol, tobacco or illicit drugs Their current medications and supplements The patient's functional ability including ADLs,fall risks, home safety risks, cognitive, and hearing and visual impairment Diet and physical activities  Evidence for depression or mood disorders  The patient's weight, height, BMI, and visual acuity have been recorded in the chart.  I have made referrals, counseling, and provided education to the patient based on review of the above and I have provided the patient with a written personalized care plan for preventive services.    Raynelle Dick, NP   01/13/2023

## 2023-01-13 ENCOUNTER — Ambulatory Visit (INDEPENDENT_AMBULATORY_CARE_PROVIDER_SITE_OTHER): Payer: Medicare Other | Admitting: Nurse Practitioner

## 2023-01-13 ENCOUNTER — Encounter: Payer: Self-pay | Admitting: Nurse Practitioner

## 2023-01-13 VITALS — BP 132/70 | HR 100 | Temp 97.5°F | Ht 69.0 in | Wt 171.0 lb

## 2023-01-13 DIAGNOSIS — R918 Other nonspecific abnormal finding of lung field: Secondary | ICD-10-CM | POA: Diagnosis not present

## 2023-01-13 DIAGNOSIS — R6889 Other general symptoms and signs: Secondary | ICD-10-CM | POA: Diagnosis not present

## 2023-01-13 DIAGNOSIS — I10 Essential (primary) hypertension: Secondary | ICD-10-CM

## 2023-01-13 DIAGNOSIS — I700 Atherosclerosis of aorta: Secondary | ICD-10-CM

## 2023-01-13 DIAGNOSIS — L989 Disorder of the skin and subcutaneous tissue, unspecified: Secondary | ICD-10-CM | POA: Diagnosis not present

## 2023-01-13 DIAGNOSIS — R7309 Other abnormal glucose: Secondary | ICD-10-CM

## 2023-01-13 DIAGNOSIS — J4520 Mild intermittent asthma, uncomplicated: Secondary | ICD-10-CM

## 2023-01-13 DIAGNOSIS — G720 Drug-induced myopathy: Secondary | ICD-10-CM | POA: Diagnosis not present

## 2023-01-13 DIAGNOSIS — Z79899 Other long term (current) drug therapy: Secondary | ICD-10-CM

## 2023-01-13 DIAGNOSIS — E559 Vitamin D deficiency, unspecified: Secondary | ICD-10-CM | POA: Diagnosis not present

## 2023-01-13 DIAGNOSIS — E782 Mixed hyperlipidemia: Secondary | ICD-10-CM | POA: Diagnosis not present

## 2023-01-13 DIAGNOSIS — E663 Overweight: Secondary | ICD-10-CM

## 2023-01-13 DIAGNOSIS — Z0001 Encounter for general adult medical examination with abnormal findings: Secondary | ICD-10-CM

## 2023-01-13 DIAGNOSIS — J439 Emphysema, unspecified: Secondary | ICD-10-CM

## 2023-01-13 DIAGNOSIS — Z0000 Encounter for general adult medical examination without abnormal findings: Secondary | ICD-10-CM

## 2023-01-13 NOTE — Patient Instructions (Signed)

## 2023-01-14 LAB — CBC WITH DIFFERENTIAL/PLATELET
Absolute Monocytes: 683 cells/uL (ref 200–950)
Basophils Absolute: 62 cells/uL (ref 0–200)
Basophils Relative: 0.9 %
Eosinophils Absolute: 159 cells/uL (ref 15–500)
Eosinophils Relative: 2.3 %
HCT: 40.5 % (ref 38.5–50.0)
Hemoglobin: 13.5 g/dL (ref 13.2–17.1)
Lymphs Abs: 1401 cells/uL (ref 850–3900)
MCH: 30.1 pg (ref 27.0–33.0)
MCHC: 33.3 g/dL (ref 32.0–36.0)
MCV: 90.2 fL (ref 80.0–100.0)
MPV: 9 fL (ref 7.5–12.5)
Monocytes Relative: 9.9 %
Neutro Abs: 4595 cells/uL (ref 1500–7800)
Neutrophils Relative %: 66.6 %
Platelets: 274 10*3/uL (ref 140–400)
RBC: 4.49 10*6/uL (ref 4.20–5.80)
RDW: 13.2 % (ref 11.0–15.0)
Total Lymphocyte: 20.3 %
WBC: 6.9 10*3/uL (ref 3.8–10.8)

## 2023-01-14 LAB — COMPLETE METABOLIC PANEL WITH GFR
AG Ratio: 1.8 (calc) (ref 1.0–2.5)
ALT: 14 U/L (ref 9–46)
AST: 13 U/L (ref 10–35)
Albumin: 4 g/dL (ref 3.6–5.1)
Alkaline phosphatase (APISO): 79 U/L (ref 35–144)
BUN: 18 mg/dL (ref 7–25)
CO2: 28 mmol/L (ref 20–32)
Calcium: 9.1 mg/dL (ref 8.6–10.3)
Chloride: 106 mmol/L (ref 98–110)
Creat: 1.06 mg/dL (ref 0.70–1.22)
Globulin: 2.2 g/dL (calc) (ref 1.9–3.7)
Glucose, Bld: 102 mg/dL — ABNORMAL HIGH (ref 65–99)
Potassium: 4.2 mmol/L (ref 3.5–5.3)
Sodium: 142 mmol/L (ref 135–146)
Total Bilirubin: 0.5 mg/dL (ref 0.2–1.2)
Total Protein: 6.2 g/dL (ref 6.1–8.1)
eGFR: 69 mL/min/{1.73_m2} (ref >=60)

## 2023-01-14 LAB — LIPID PANEL
Cholesterol: 157 mg/dL (ref <200)
HDL: 59 mg/dL (ref >=40)
LDL Cholesterol (Calc): 83 mg/dL (calc)
Non-HDL Cholesterol (Calc): 98 mg/dL (calc) (ref <130)
Total CHOL/HDL Ratio: 2.7 (calc) (ref <5.0)
Triglycerides: 73 mg/dL (ref <150)

## 2023-01-14 LAB — MAGNESIUM: Magnesium: 1.9 mg/dL (ref 1.5–2.5)

## 2023-01-19 ENCOUNTER — Ambulatory Visit (INDEPENDENT_AMBULATORY_CARE_PROVIDER_SITE_OTHER): Payer: Medicare Other

## 2023-01-19 ENCOUNTER — Other Ambulatory Visit: Payer: Self-pay | Admitting: Podiatry

## 2023-01-19 ENCOUNTER — Ambulatory Visit: Payer: Medicare Other | Admitting: Podiatry

## 2023-01-19 ENCOUNTER — Encounter: Payer: Self-pay | Admitting: Podiatry

## 2023-01-19 DIAGNOSIS — M7750 Other enthesopathy of unspecified foot: Secondary | ICD-10-CM

## 2023-01-19 DIAGNOSIS — M722 Plantar fascial fibromatosis: Secondary | ICD-10-CM

## 2023-01-19 MED ORDER — TRIAMCINOLONE ACETONIDE 40 MG/ML IJ SUSP
20.0000 mg | Freq: Once | INTRAMUSCULAR | Status: AC
Start: 2023-01-19 — End: 2023-01-19
  Administered 2023-01-19: 20 mg

## 2023-01-19 NOTE — Patient Instructions (Signed)

## 2023-01-20 NOTE — Progress Notes (Signed)
Subjective:  Patient ID: Carlos Baldwin, male    DOB: 06-Feb-1935,  MRN: [id number] HPI Chief Complaint  Patient presents with   Foot Pain    Plantar heel right - aching x few days, tried Tylenol, AM pain and its constant when walking, Dr. Leeanne Deed treated same problem years ago, gave him a shot and hasn't had a problem until now   New Patient (Initial Visit)    88 y.o. male presents with the above complaint.   ROS: Denies fever chills nausea vomit muscle aches pains calf pain back pain chest pain shortness of breath.  Past Medical History:  Diagnosis Date   Arthritis    oa   Asthma    as child   Bladder neck obstruction 08/02/2013   Colon polyps    Hyperlipidemia    Hypertension    Measles    as child   Mumps    as child   Positive colorectal cancer screening using Cologuard test 07/17/2019   Pre-diabetes    Past Surgical History:  Procedure Laterality Date   KNEE SURGERY Right 1972   cartlidge repair   left knee arthroscopy  01-14-2012   TOTAL KNEE ARTHROPLASTY Left 11/04/2015   Procedure: LEFT TOTAL KNEE ARTHROPLASTY;  Surgeon: Ollen Gross, MD;  Location: [facility];  Service: Orthopedics;  Laterality: Left;   TOTAL KNEE ARTHROPLASTY Right 10/11/2017   Procedure: RIGHT TOTAL KNEE ARTHROPLASTY;  Surgeon: Ollen Gross, MD;  Location: [facility];  Service: Orthopedics;  Laterality: Right;    Current Outpatient Medications:    Ascorbic Acid (VITAMIN C) 500 MG CAPS, Vitamin C, Disp: , Rfl:    diclofenac Sodium (VOLTAREN) 1 % GEL, APPLY 2 GRAMS TOPICALLY TO THE AFFECTED AREA FOUR TIMES DAILY AS NEEDED FOR PAIN, Disp: , Rfl:    finasteride (PROSCAR) 5 MG tablet, TAKE 1 TABLET BY MOUTH DAILY FOR PROSTATE, Disp: 90 tablet, Rfl: 3   Fluticasone-Umeclidin-Vilant (TRELEGY ELLIPTA) 200-62.5-25 MCG/ACT AEPB, INHALE 1 PUFF BY MOUTH DAILY, Disp: 180 each, Rfl: 3   losartan (COZAAR) 100 MG tablet, TAKE 1 TABLET BY MOUTH DAILY FOR BLOOD PRESSURE, Disp: 90 tablet, Rfl: 3    multivitamin-lutein (OCUVITE-LUTEIN) CAPS capsule, Take 1 capsule by mouth daily., Disp: , Rfl:    Polyethyl Glycol-Propyl Glycol (SYSTANE OP), Place 1 drop into both eyes 3 (three) times daily as needed (Dry eyes). , Disp: , Rfl:   Allergies  Allergen Reactions   Atenolol Other (See Comments)    "not sure"   Pravastatin     Joint pain   Simvastatin     Joint pain   Sulfa Antibiotics    Sulfonamide Derivatives Swelling and Rash   Review of Systems Objective:  There were no vitals filed for this visit.  General: Well developed, nourished, in no acute distress, alert and oriented x3   Dermatological: Skin is warm, dry and supple bilateral. Nails x 10 are well maintained; remaining integument appears unremarkable at this time. There are no open sores, no preulcerative lesions, no rash or signs of infection present.  Vascular: Dorsalis Pedis artery and Posterior Tibial artery pedal pulses are 2/4 bilateral with immedate capillary fill time. Pedal hair growth present. No varicosities and no lower extremity edema present bilateral.   Neruologic: Grossly intact via light touch bilateral. Vibratory intact via tuning fork bilateral. Protective threshold with Semmes Wienstein monofilament intact to all pedal sites bilateral. Patellar and Achilles deep tendon reflexes 2+ bilateral. No Babinski or clonus noted bilateral.   Musculoskeletal: No gross boney  pedal deformities bilateral. No pain, crepitus, or limitation noted with foot and ankle range of motion bilateral. Muscular strength 5/5 in all groups tested bilateral.  Pain on palpation medial calcaneal tubercle of the right heel.  No pain on medial lateral compression of the calcaneus  Gait: Unassisted, Nonantalgic.    Radiographs:  Radiographs taken today demonstrate osseously mature individual evaluation of the area in question demonstrates soft tissue increase in density plantar fascial Caney insertion site mild plantar distally oriented  calcaneal heel spur.    Assessment & Plan:   Assessment: Planter fasciitis right  Plan: Discussed appropriate shoe gear stretching exercise ice therapy sugar modifications injected his right heel.  20 mg Kenalog was injected to the point of maximal tenderness.     Jahara Dail T[place], [place]

## 2023-03-30 ENCOUNTER — Encounter: Payer: Medicare Other | Admitting: Internal Medicine

## 2023-04-21 ENCOUNTER — Encounter: Payer: Self-pay | Admitting: Internal Medicine

## 2023-04-21 NOTE — Progress Notes (Unsigned)
Annual  Screening/Preventative Visit  & Comprehensive Evaluation & Examination   Future Appointments  Date Time Provider Department  04/22/2023                     cpe 11:00 AM Lucky Cowboy, MD [facility]  01/13/2024                      wellness  9:00 AM Raynelle Dick, NP [facility]  05/03/2024                     cpe 11:00 AM Lucky Cowboy, MD [facility]         This very nice 88 y.o.  MWM presents for a Screening /Preventative Visit & comprehensive evaluation and management of multiple medical co-morbidities.  Patient has been followed for HTN, HLD, Prediabetes and Vitamin D Deficiency. Chest CTscan in Nov 2021 revealed  Aortic Atherosclerosis (I70.0). CXR on 10/25/2019 showed Hyperinflation consistent with COPD (J43.9)        HTN predates circa 2003. Patient's BP has been controlled at home.  Today's BP is  at goal -  130/80 . Patient denies any cardiac symptoms as chest pain, palpitations, shortness of breath, dizziness or ankle swelling.        Patient's hyperlipidemia is controlled with diet and medications. Patient denies myalgias or other medication SE's. Last lipids were  at goal:  Lab Results  Component Value Date   CHOL 157 01/13/2023   HDL 59 01/13/2023   LDLCALC 83 01/13/2023   TRIG 73 01/13/2023   CHOLHDL 2.7 01/13/2023         Patient has hx/o prediabetes (A1c 6.1% /2011) and patient denies reactive hypoglycemic symptoms, visual blurring, diabetic polys or paresthesias. Last A1c was not at goal:   Lab Results  Component Value Date   HGBA1C 6.2 (H) 10/12/2022         Finally, patient has history of Vitamin D Deficiency ("35" /2008) and last vitamin D was at goal:   Lab Results  Component Value Date   VD25OH 44 10/12/2022       Current Outpatient Medications on File Prior to Visit  Medication Sig   Vitamin D 2,000 Units   Takedaily.   finasteride  5 MG tablet TAKE 1 TABLET  DAILY FOR PROSTATE   fluorouracil 5 % cream Apply to suspect  areas 2 x / day til skin raw   TRELEGY ELLIPTA 100-62.5-25  Use 1 inhalation daily   losartan 100 MG tablet TAKE 1 TABLET DAILY    meloxicam 15 MG tablet TAKE 1/2 TO 1 TABLET DAILY   montelukast 10 MG tablet Take 1 tablet daily for Allergies & Asthma   SYSTANE Place 1 drop into both eyes 3 times daily as needed.     Allergies  Allergen Reactions   Atenolol Other (See Comments)    "not sure"   Pravastatin     Joint pain   Simvastatin     Joint pain   Sulfa Antibiotics    Sulfonamide Derivatives Swelling and Rash     Past Medical History:  Diagnosis Date   Arthritis    oa   Asthma    as child   Bladder neck obstruction 08/02/2013   Colon polyps    Hyperlipidemia    Hypertension    Measles    as child   Mumps    as child   Positive colorectal cancer screening using Cologuard  test 07/17/2019   Pre-diabetes      Health Maintenance  Topic Date Due   Zoster Vaccines- Shingrix (1 of 2) Never done   COVID-19 Vaccine (3 - Pfizer risk series) 01/09/2020   INFLUENZA VACCINE  04/07/2021   TETANUS/TDAP  09/17/2029   PNA vac Low Risk Adult  Completed   HPV VACCINES  Aged Out     Immunization History  Administered Date(s) Administered   Influenza, High Dose  06/03/2018, 05/25/2019   Influenza 06/06/2020   PFIZER-SARS-COV-2 Vacc 11/12/2019, 12/12/2019   Pneumococcal -13 06/27/2014   Pneumococcal -23 09/07/2006   Td 03/08/2019   Tdap 09/18/2019   Zoster, Live 09/07/2008    Last Colon -  08/18/2019 - Dr Russella Dar - Negative & Recc No  f/u due to age   Past Surgical History:  Procedure Laterality Date   KNEE SURGERY Right 1972   cartlidge repair   left knee arthroscopy  01-14-2012   TOTAL KNEE ARTHROPLASTY Left 11/04/2015   Procedure: LEFT TOTAL KNEE ARTHROPLASTY;  Surgeon: Ollen Gross, MD;  Location: [facility];  Service: Orthopedics;  Laterality: Left;   TOTAL KNEE ARTHROPLASTY Right 10/11/2017   Procedure: RIGHT TOTAL KNEE ARTHROPLASTY;  Surgeon: Ollen Gross, MD;   Location: [facility];  Service: Orthopedics;  Laterality: Right;     Family History  Problem Relation Age of Onset   Colon cancer Neg Hx    Esophageal cancer Neg Hx    Liver cancer Neg Hx    Stomach cancer Neg Hx     Social History   Socioeconomic History   Marital status: Married    Spouse name: Stanton Kidney   Number of children: 1   son   Occupational History   Retired Product manager   Tobacco Use   Smoking status: Never   Smokeless tobacco: Never  Vaping Use   Vaping Use: Never used  Substance and Sexual Activity   Alcohol use: No   Drug use: No   Sexual activity: Not on file      ROS  Constitutional: Denies fever, chills, weight loss/gain, headaches, insomnia,  night sweats or change in appetite. Does c/o fatigue. Eyes: Denies redness, blurred vision, diplopia, discharge, itchy or watery eyes.  ENT: Denies discharge, congestion, post nasal drip, epistaxis, sore throat, earache, hearing loss, dental pain, Tinnitus, Vertigo, Sinus pain or snoring.  Cardio: Denies chest pain, palpitations, irregular heartbeat, syncope, dyspnea, diaphoresis, orthopnea, PND, claudication or edema Respiratory: denies cough, dyspnea, DOE, pleurisy, hoarseness, laryngitis or wheezing.  Gastrointestinal: Denies dysphagia, heartburn, reflux, water brash, pain, cramps, nausea, vomiting, bloating, diarrhea, constipation, hematemesis, melena, hematochezia, jaundice or hemorrhoids Genitourinary: Denies dysuria, frequency, urgency, nocturia, hesitancy, discharge, hematuria or flank pain Musculoskeletal: Denies arthralgia, myalgia, stiffness, Jt. Swelling, pain, limp or strain/sprain. Denies Falls. Skin: Denies puritis, rash, hives, warts, acne, eczema or change in skin lesion Neuro: No weakness, tremor, incoordination, spasms, paresthesia or pain Psychiatric: Denies confusion, memory loss or sensory loss. Denies Depression. Endocrine: Denies change in weight, skin, hair change, nocturia, and paresthesia,  diabetic polys, visual blurring or hyper / hypo glycemic episodes.  Heme/Lymph: No excessive bleeding, bruising or enlarged lymph nodes.   Physical Exam  BP 130/80   Pulse 77   Temp 97.9 F (36.6 C)   Resp 16   Ht 5\' 9"  (1.753 m)   Wt 167 lb 12.8 oz (76.1 kg)   SpO2 96%   BMI 24.78 kg/m   General Appearance: Well nourished and well groomed and in no apparent distress.  Eyes:  PERRLA, EOMs, conjunctiva no swelling or erythema, normal fundi and vessels. Sinuses: No frontal/maxillary tenderness ENT/Mouth: EACs patent / TMs  nl. Nares clear without erythema, swelling, mucoid exudates. Oral hygiene is good. No erythema, swelling, or exudate. Tongue normal, non-obstructing. Tonsils not swollen or erythematous. Hearing normal.  Neck: Supple, thyroid not palpable. No bruits, nodes or JVD. Respiratory: Respiratory effort normal.  BS equal and clear bilateral without rales, rhonci, wheezing or stridor. Cardio: Heart sounds are normal with regular rate and rhythm and no murmurs, rubs or gallops. Peripheral pulses are normal and equal bilaterally without edema. No aortic or femoral bruits. Chest: symmetric with normal excursions and percussion.  Abdomen: Soft, with Nl bowel sounds. Nontender, no guarding, rebound, hernias, masses, or organomegaly.  Lymphatics: Non tender without lymphadenopathy.  Musculoskeletal: Full ROM all peripheral extremities, joint stability, 5/5 strength, and normal gait. Skin: Warm and dry without rashes, lesions, cyanosis, clubbing or  ecchymosis.  Neuro: Cranial nerves intact, reflexes equal bilaterally. Normal muscle tone, no cerebellar symptoms. Sensation intact.  Pysch: Alert and oriented X 3 with normal affect, insight and judgment appropriate.   Assessment and Plan  1. Annual Preventative/Screening Exam    2. Essential hypertension  - EKG 12-Lead - [place], RETROPERITNL ABD,  LTD - Urinalysis, Routine w reflex microscopic - Microalbumin / creatinine urine  ratio - CBC with Differential/Platelet - COMPLETE METABOLIC PANEL WITH GFR - Magnesium - TSH  3. Hyperlipidemia, mixed  - EKG 12-Lead - [place], RETROPERITNL ABD,  LTD - Lipid panel - Hemoglobin A1c - Insulin, random  4. Abnormal glucose  - EKG 12-Lead - [place], RETROPERITNL ABD,  LTD - Hemoglobin A1c - Insulin, random  5. Vitamin D deficiency  - VITAMIN D 25 Hydroxy   6. Aortic atherosclerosis (HCC) by Chest CT scan Nov 2021  - EKG 12-Lead - Lipid panel  7. BPH with obstruction/lower urinary tract symptoms  - PSA  8. Prostate cancer screening  - PSA  9. Screening for colorectal cancer  - POC Hemoccult Bld/Stl  10. Screening for ischemic heart disease  - EKG 12-Lead  11. Screening for AAA (aortic abdominal aneurysm)  - [place], RETROPERITNL ABD,  LTD  12. Medication management  - Urinalysis, Routine w reflex microscopic - Microalbumin / creatinine urine ratio - Magnesium - Lipid panel - TSH - Hemoglobin A1c - Insulin, random - VITAMIN D 25 Hydroxy          Patient was counseled in prudent diet, weight control to achieve/maintain BMI less than 25, BP monitoring, regular exercise and medications as discussed.  Discussed med effects and SE's. Routine screening labs and tests as requested with regular follow-up as recommended. Over 40 minutes of exam, counseling, chart review and high complex critical decision making was performed   Marinus Maw, MD

## 2023-04-21 NOTE — Patient Instructions (Signed)

## 2023-04-22 ENCOUNTER — Encounter: Payer: Self-pay | Admitting: Internal Medicine

## 2023-04-22 ENCOUNTER — Ambulatory Visit (INDEPENDENT_AMBULATORY_CARE_PROVIDER_SITE_OTHER): Payer: Medicare Other | Admitting: Internal Medicine

## 2023-04-22 VITALS — BP 130/80 | HR 77 | Temp 97.9°F | Resp 16 | Ht 69.0 in | Wt 167.8 lb

## 2023-04-22 DIAGNOSIS — I700 Atherosclerosis of aorta: Secondary | ICD-10-CM | POA: Diagnosis not present

## 2023-04-22 DIAGNOSIS — R7309 Other abnormal glucose: Secondary | ICD-10-CM | POA: Diagnosis not present

## 2023-04-22 DIAGNOSIS — I10 Essential (primary) hypertension: Secondary | ICD-10-CM

## 2023-04-22 DIAGNOSIS — Z136 Encounter for screening for cardiovascular disorders: Secondary | ICD-10-CM

## 2023-04-22 DIAGNOSIS — Z125 Encounter for screening for malignant neoplasm of prostate: Secondary | ICD-10-CM

## 2023-04-22 DIAGNOSIS — Z79899 Other long term (current) drug therapy: Secondary | ICD-10-CM

## 2023-04-22 DIAGNOSIS — E782 Mixed hyperlipidemia: Secondary | ICD-10-CM

## 2023-04-22 DIAGNOSIS — Z0000 Encounter for general adult medical examination without abnormal findings: Secondary | ICD-10-CM | POA: Diagnosis not present

## 2023-04-22 DIAGNOSIS — Z1211 Encounter for screening for malignant neoplasm of colon: Secondary | ICD-10-CM

## 2023-04-22 DIAGNOSIS — N138 Other obstructive and reflux uropathy: Secondary | ICD-10-CM

## 2023-04-22 DIAGNOSIS — N401 Enlarged prostate with lower urinary tract symptoms: Secondary | ICD-10-CM

## 2023-04-22 DIAGNOSIS — E559 Vitamin D deficiency, unspecified: Secondary | ICD-10-CM | POA: Diagnosis not present

## 2023-04-22 DIAGNOSIS — Z0001 Encounter for general adult medical examination with abnormal findings: Secondary | ICD-10-CM

## 2023-04-23 LAB — LIPID PANEL
Cholesterol: 165 mg/dL (ref <200)
HDL: 55 mg/dL (ref >=40)
LDL Cholesterol (Calc): 93 mg/dL
Non-HDL Cholesterol (Calc): 110 mg/dL (ref <130)
Total CHOL/HDL Ratio: 3 (calc) (ref <5.0)
Triglycerides: 81 mg/dL (ref <150)

## 2023-04-23 LAB — COMPLETE METABOLIC PANEL WITH GFR
AG Ratio: 1.8 (calc) (ref 1.0–2.5)
ALT: 12 U/L (ref 9–46)
AST: 13 U/L (ref 10–35)
Albumin: 4.3 g/dL (ref 3.6–5.1)
Alkaline phosphatase (APISO): 75 U/L (ref 35–144)
BUN: 19 mg/dL (ref 7–25)
CO2: 30 mmol/L (ref 20–32)
Calcium: 9.4 mg/dL (ref 8.6–10.3)
Chloride: 106 mmol/L (ref 98–110)
Creat: 0.95 mg/dL (ref 0.70–1.22)
Globulin: 2.4 g/dL (ref 1.9–3.7)
Glucose, Bld: 104 mg/dL — ABNORMAL HIGH (ref 65–99)
Potassium: 4.2 mmol/L (ref 3.5–5.3)
Sodium: 143 mmol/L (ref 135–146)
Total Bilirubin: 0.4 mg/dL (ref 0.2–1.2)
Total Protein: 6.7 g/dL (ref 6.1–8.1)
eGFR: 78 mL/min/{1.73_m2} (ref >=60)

## 2023-04-23 LAB — CBC WITH DIFFERENTIAL/PLATELET
Absolute Monocytes: 762 {cells}/uL (ref 200–950)
Basophils Absolute: 62 {cells}/uL (ref 0–200)
Basophils Relative: 0.8 %
Eosinophils Absolute: 162 {cells}/uL (ref 15–500)
Eosinophils Relative: 2.1 %
HCT: 41.1 % (ref 38.5–50.0)
Hemoglobin: 13.6 g/dL (ref 13.2–17.1)
Lymphs Abs: 1910 {cells}/uL (ref 850–3900)
MCH: 29.8 pg (ref 27.0–33.0)
MCHC: 33.1 g/dL (ref 32.0–36.0)
MCV: 90.1 fL (ref 80.0–100.0)
MPV: 8.7 fL (ref 7.5–12.5)
Monocytes Relative: 9.9 %
Neutro Abs: 4805 {cells}/uL (ref 1500–7800)
Neutrophils Relative %: 62.4 %
Platelets: 285 10*3/uL (ref 140–400)
RBC: 4.56 10*6/uL (ref 4.20–5.80)
RDW: 13.2 % (ref 11.0–15.0)
Total Lymphocyte: 24.8 %
WBC: 7.7 10*3/uL (ref 3.8–10.8)

## 2023-04-23 LAB — URINALYSIS, ROUTINE W REFLEX MICROSCOPIC
Bilirubin Urine: NEGATIVE
Glucose, UA: NEGATIVE
Hgb urine dipstick: NEGATIVE
Hyaline Cast: NONE SEEN /LPF
Leukocytes,Ua: NEGATIVE
Nitrite: NEGATIVE
RBC / HPF: NONE SEEN /HPF (ref 0–2)
Specific Gravity, Urine: 1.023 (ref 1.001–1.035)
Squamous Epithelial / HPF: NONE SEEN /HPF (ref <=5)
pH: <=5 (ref 5.0–8.0)

## 2023-04-23 LAB — HEMOGLOBIN A1C
Hgb A1c MFr Bld: 6.2 %{Hb} — ABNORMAL HIGH (ref <5.7)
Mean Plasma Glucose: 131 mg/dL
eAG (mmol/L): 7.3 mmol/L

## 2023-04-23 LAB — PSA: PSA: 0.39 ng/mL (ref <=4.00)

## 2023-04-23 LAB — MICROALBUMIN / CREATININE URINE RATIO
Creatinine, Urine: 168 mg/dL (ref 20–320)
Microalb Creat Ratio: 20 mg/g{creat} (ref <30)
Microalb, Ur: 3.3 mg/dL

## 2023-04-23 LAB — INSULIN, RANDOM: Insulin: 21.4 u[IU]/mL — ABNORMAL HIGH

## 2023-04-23 LAB — MAGNESIUM: Magnesium: 1.9 mg/dL (ref 1.5–2.5)

## 2023-04-23 LAB — VITAMIN D 25 HYDROXY (VIT D DEFICIENCY, FRACTURES): Vit D, 25-Hydroxy: 45 ng/mL (ref 30–100)

## 2023-04-23 LAB — TSH: TSH: 2.35 m[IU]/L (ref 0.40–4.50)

## 2023-04-23 LAB — MICROSCOPIC MESSAGE

## 2023-04-23 NOTE — Progress Notes (Signed)
^<^<^<^<^<^<^<^<^<^<^<^<^<^<^<^<^<^<^<^<^<^<^<^<^<^<^<^<^<^<^<^<^<^<^<^<^ ^>^>^>^>^>^>^>^>^>^>^>>^>^>^>^>^>^>^>^>^>^>^>^>^>^>^>^>^>^>^>^>^>^>^>^>^>  -Test results slightly outside the reference range are not unusual. If there is anything important, I will review this with you,  otherwise it is considered normal test values.  If you have further questions,  please do not hesitate to contact me at the office or via My Chart.   ^<^<^<^<^<^<^<^<^<^<^<^<^<^<^<^<^<^<^<^<^<^<^<^<^<^<^<^<^<^<^<^<^<^<^<^<^ ^>^>^>^>^>^>^>^>^>^>^>^>^>^>^>^>^>^>^>^>^>^>^>^>^>^>^>^>^>^>^>^>^>^>^>^>^  -  A1c = 6.0% is 12 week average glucose & is too high                                                                            ( Normal is less than 5.7%  !)   So,   -   It is very important that you work harder with diet by                                avoiding all foods that are white except chicken, fish & calliflower.  - Avoid white rice  (brown & wild rice is OK),   - Avoid white potatoes  (sweet potatoes in moderation is OK),   White bread or wheat bread or anything made out of                                                 white flour like bagels, donuts, rolls, buns, biscuits, cakes,  - pastries, cookies, pizza crust, and pasta (made from white flour & egg whites)   - vegetarian pasta or spinach or wheat pasta is OK.  - Multigrain breads like Arnold's, Pepperidge Farm or                                                    multigrain sandwich thins or high fiber breads like   Eureka bread or "Dave's Killer" breads that are                                                                        4 to 5 grams fiber per slice !  are best.    Diet, exercise and weight loss can reverse and cure                                                                 diabetes in the early stages.     ^<^<^<^<^<^<^<^<^<^<^<^<^<^<^<^<^<^<^<^<^<^<^<^<^<^<^<^<^<^<^<^<^<^<^<^<^ ^>^>^>^>^>^>^>^>^>^>^>^>^>^>^>^>^>^>^>^>^>^>^>^>^>^>^>^>^>^>^>^>^>^>^>^>^  -  PSA is very Low - No Prostate Cancer  - Great  !   ^<^<^<^<^<^<^<^<^<^<^<^<^<^<^<^<^<^<^<^<^<^<^<^<^<^<^<^<^<^<^<^<^<^<^<^<^ ^>^>^>^>^>^>^>^>^>^>^>^>^>^>^>^>^>^>^>^>^>^>^>^>^>^>^>^>^>^>^>^>^>^>^>^>^  -  Chol = 165   Excellent   - Very low risk for Heart Attack  / Stroke  ^>^>^>^>^>^>^>^>^>^>^>^>^>^>^>^>^>^>^>^>^>^>^>^>^>^>^>^>^>^>^>^>^>^>^>^>^ ^>^>^>^>^>^>^>^>^>^>^>^>^>^>^>^>^>^>^>^>^>^>^>^>^>^>^>^>^>^>^>^>^>^>^>^>^  -  Vitamin D = 45  is too Low    - Vitamin D goal is between 70-100.   - Please INCREASE  your Vitamin D                                             to Vitamin D 5,000 caps  x 2 capsules = 10,000 units / day   - It is very important as a natural anti-inflammatory and helping the                           immune system protect against viral infections, like the Covid-19    helping hair, skin, and nails, as well as reducing stroke and heart attack risk.   - It helps your bones and helps with mood.  - It also decreases numerous cancer risks   - Low Vit D is associated with a 200-300% higher risk for CANCER   and 200-300% higher risk for HEART   ATTACK  &  STROKE.    - It is also associated with higher death rate at younger ages,   autoimmune diseases like Rheumatoid arthritis, Lupus, Multiple Sclerosis.     - Also many other serious conditions, like depression, Alzheimer's  Dementia,  muscle aches, fatigue, fibromyalgia   ^<^<^<^<^<^<^<^<^<^<^<^<^<^<^<^<^<^<^<^<^<^<^<^<^<^<^<^<^<^<^<^<^<^<^<^<^ ^>^>^>^>^>^>^>^>^>^>^>^>^>^>^>^>^>^>^>^>^>^>^>^>^>^>^>^>^>^>^>^>^>^>^>^>^  -  All Else - CBC - Kidneys - Electrolytes - Liver - Magnesium & Thyroid    - all  Normal /  OK  ^<^<^<^<^<^<^<^<^<^<^<^<^<^<^<^<^<^<^<^<^<^<^<^<^<^<^<^<^<^<^<^<^<^<^<^<^ ^>^>^>^>^>^>^>^>^>^>^>^>^>^>^>^>^>^>^>^>^>^>^>^>^>^>^>^>^>^>^>^>^>^>^>^>^

## 2023-04-30 DIAGNOSIS — H35371 Puckering of macula, right eye: Secondary | ICD-10-CM | POA: Diagnosis not present

## 2023-04-30 DIAGNOSIS — H52203 Unspecified astigmatism, bilateral: Secondary | ICD-10-CM | POA: Diagnosis not present

## 2023-04-30 DIAGNOSIS — H353131 Nonexudative age-related macular degeneration, bilateral, early dry stage: Secondary | ICD-10-CM | POA: Diagnosis not present

## 2023-04-30 DIAGNOSIS — H2513 Age-related nuclear cataract, bilateral: Secondary | ICD-10-CM | POA: Diagnosis not present

## 2023-06-11 ENCOUNTER — Other Ambulatory Visit: Payer: Self-pay | Admitting: Internal Medicine

## 2023-06-11 MED ORDER — MELOXICAM 15 MG PO TABS: ORAL_TABLET | ORAL | 3 refills | Status: DC

## 2023-07-03 ENCOUNTER — Other Ambulatory Visit: Payer: Self-pay | Admitting: Nurse Practitioner

## 2023-07-03 DIAGNOSIS — J4520 Mild intermittent asthma, uncomplicated: Secondary | ICD-10-CM

## 2023-07-26 NOTE — Progress Notes (Deleted)
6 MONTH FOLLOW UP Assessment:     Aortic atherosclerosis (HCC) Via CXR 10/2019 Control blood pressure, cholesterol, glucose, increase exercise.   Essential hypertension - continue medications, DASH diet, exercise and monitor at home. Call if greater than 130/80.   Hyperlipidemia, mixed/ Statin myopathy decrease fatty foods increase activity.   Overweight Recommended diet heavy in fruits and veggies and low in animal meats, cheeses, and dairy products, appropriate calorie intake Continue exercise Follow up at next visit   Pulmonary emphysema, unspecified emphysema type (HCC) Per CXR; Non smoker; improved with trellegy ellipta - given trellegy and breztri samples due to donut whole; will monitor  Pulmonary nodule CT 07/11/20 stable right middle lobe nodule Repeat CT of chest ordered for next month If stable should need no further imaging Addendum: Pt has declined the additional CT of chest   Abnormal glucose Discussed disease progression and risks Discussed diet/exercise, weight management and risk modification  Vitamin D deficiency Continue supplement  Medication management Monitor  Future Appointments  Date Time Provider Department Center  07/27/2023 11:00 AM Raynelle Dick, NP [facility] None  01/13/2024  9:00 AM Raynelle Dick, NP [facility] None  05/03/2024 11:00 AM Lucky Cowboy, MD [facility] None     Subjective:  Carlos Baldwin is a 88 y.o. male who presents for 3 month follow up for HTN, hyperlipidemia, prediabetes, and vitamin D Def.    He had + cologuard in 2020, underwent colonoscopy/polpectomy by Dr. Russella Dar, recommended no follow up due to age.   Persistent cough, has emphysematous changes per CXR; never smoker, remote hx of asthma as a child Now on singulair, trellegy with excellent results, but currently in the donut whole  had CT CT chest 11/06/2019 Noncalcified 7 mm pulmonary nodule within the right middle lobe. 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 He is watching portions. Wt Readings from Last 3 Encounters:  04/22/23 167 lb 12.8 oz (76.1 kg)  01/13/23 171 lb (77.6 kg)  10/12/22 174 lb 6.4 oz (79.1 kg)   He has aortic atherosclerosis per CT 11/2019 His blood pressure has been controlled at home, today their BP is   BP Readings from Last 3 Encounters:  04/22/23 130/80  01/13/23 132/70  10/12/22 134/86    He does workout, he rides bike at home, has retired from police work.   He denies chest pain, shortness of breath, dizziness.   He is not on cholesterol medication and denies myalgias. His cholesterol is at goal. The cholesterol last visit was:   Lab Results  Component Value Date   CHOL 165 04/22/2023   HDL 55 04/22/2023   LDLCALC 93 04/22/2023   TRIG 81 04/22/2023   CHOLHDL 3.0 04/22/2023   He has been working on diet and exercise for prediabetes, and denies paresthesia of the feet, polydipsia and polyuria. Last A1C in the office was:   Lab Results  Component Value Date   HGBA1C 6.2 (H) 04/22/2023   Patient is on Vitamin D supplement, was off Lab Results  Component Value Date   VD25OH 45 04/22/2023   Lab Results  Component Value Date   GFRNONAA 73 09/25/2020     Medication Review: Current Outpatient Medications on File Prior to Visit  Medication Sig Dispense Refill   Ascorbic  Acid (VITAMIN C) 500 MG CAPS Vitamin C     diclofenac Sodium (VOLTAREN) 1 % GEL APPLY 2 GRAMS TOPICALLY TO THE AFFECTED AREA FOUR TIMES DAILY AS NEEDED FOR PAIN     finasteride (PROSCAR) 5 MG tablet TAKE 1 TABLET BY MOUTH DAILY FOR PROSTATE 90 tablet 3   losartan (COZAAR) 100 MG tablet TAKE 1 TABLET BY MOUTH DAILY FOR BLOOD PRESSURE 90 tablet 3   meloxicam (MOBIC) 15 MG tablet Take  1/2 to 1 tablet  Daily   with Food for Pain & Inflammation 90 tablet 3   multivitamin-lutein (OCUVITE-LUTEIN) CAPS capsule Take 1 capsule by mouth daily.     Polyethyl Glycol-Propyl Glycol (SYSTANE OP) Place 1 drop into both eyes 3 (three) times daily as needed (Dry eyes).      TRELEGY ELLIPTA 200-62.5-25 MCG/ACT AEPB INHALE 1 PUFF BY MOUTH DAILY 180 each 3   No current facility-administered medications on file prior to visit.   Current Problems (verified) Patient Active Problem List   Diagnosis Date Noted   COPD (chronic obstructive pulmonary disease) (HCC) 10/26/2019   Laceration of left hand 09/27/2019   Pain of left hand 09/27/2019   Abnormal glucose 03/07/2019   Medication management 12/21/2013   Essential hypertension 08/02/2013   Hyperlipidemia, mixed 08/02/2013   Vitamin D deficiency 08/02/2013     Allergies Allergies  Allergen Reactions   Atenolol Other (See Comments)    "not sure"   Pravastatin     Joint pain   Simvastatin     Joint pain   Sulfa Antibiotics    Sulfonamide Derivatives Swelling and Rash    SURGICAL HISTORY He  has a past surgical history that includes Knee surgery (Right, 1972); left knee arthroscopy (01-14-2012); Total knee arthroplasty (Left, 11/04/2015); and Total knee arthroplasty (Right, 10/11/2017). FAMILY HISTORY His family history is not on file.  SOCIAL HISTORY He  reports that he has never smoked. He has never used smokeless tobacco. He reports that he does not drink alcohol and does not use drugs.   Review of Systems  Constitutional:  Negative for malaise/fatigue and weight loss.  HENT:  Negative for hearing loss and tinnitus.   Eyes:  Negative for blurred vision and double vision.  Respiratory:  Negative for cough, shortness of breath and wheezing.   Cardiovascular:  Negative for chest pain, palpitations, orthopnea, claudication and leg swelling.  Gastrointestinal:  Negative for abdominal pain, blood in stool, constipation, diarrhea, heartburn, melena, nausea  and vomiting.  Genitourinary: Negative.   Musculoskeletal:  Positive for joint pain (left shoulder). Negative for myalgias.  Skin:  Negative for rash.  Neurological:  Negative for dizziness, tingling, sensory change, weakness and headaches.  Endo/Heme/Allergies:  Negative for polydipsia.  Psychiatric/Behavioral: Negative.    All other systems reviewed and are negative.    Objective:   There were no vitals taken for this visit. There is no height or weight on file to calculate BMI.  General appearance: alert, no distress, WD/WN, male HEENT: normocephalic, sclerae anicteric, TMs pearly, nares patent, no discharge or erythema, pharynx normal Oral cavity: MMM, no lesions Neck: supple, no lymphadenopathy, no thyromegaly, no masses Heart: RRR, distant heart sounds, normal S1, S2, no murmurs Lungs: CTA bilaterally, slight barrel chest, no wheezes, rhonchi, or rales Abdomen: +bs, soft, non tender, non distended, no masses, no hepatomegaly, no splenomegaly Musculoskeletal: nontender, no swelling, no obvious deformity Shoulder Musculoskeletal Exam  Inspection   Right     Right shoulder inspection is normal.   Left  Left shoulder inspection is normal.  Palpation   Right     Crepitus: no crepitus     Increased warmth: none     Tenderness: present       Anterior shoulder: moderate       Posterior shoulder: mild       Clavicle: none       AC joint: none       Sternoclavicular joint: none       Rotator cuff: moderate       Greater tuberosity: none       Trapezius: none       Superior pole of scapula: none       Inferior pole of scapula: none       Bicipital groove: moderate       Proximal biceps: moderate       Distal biceps: mild       Lateral arm: mild       Elbow: none   Left     Left shoulder palpation is normal.  Range of Motion   Right     Active ROM: abnormal.      Passive ROM: abnormal.    Left     Left shoulder range of motion is normal.    Extremities: no  edema, no cyanosis, no clubbing Skin: left face and left posterior ear with erythematous nodule/plaques Pulses: 2+ symmetric, upper and lower extremities, normal cap refill Neurological: alert, oriented x 3, CN2-12 intact, strength normal upper extremities and lower extremities, sensation normal throughout, DTRs 2+ throughout, no cerebellar signs, gait antalgic due to knees Psychiatric: normal affect, behavior normal, pleasant    Raynelle Dick, NP   07/26/2023

## 2023-07-27 ENCOUNTER — Ambulatory Visit: Payer: Medicare Other | Admitting: Nurse Practitioner

## 2023-08-11 ENCOUNTER — Other Ambulatory Visit: Payer: Self-pay | Admitting: Nurse Practitioner

## 2023-08-11 DIAGNOSIS — I10 Essential (primary) hypertension: Secondary | ICD-10-CM

## 2023-09-16 NOTE — Progress Notes (Signed)
 3 MONTH FOLLOW UP Assessment:     Aortic atherosclerosis (HCC) Via CXR 10/2019 Control blood pressure, cholesterol, glucose, increase exercise.   Essential hypertension - continue medications, DASH diet, exercise and monitor at home. Call if greater than 130/80.   Hyperlipidemia, mixed decrease fatty foods increase activity.   Overweight Recommended diet heavy in fruits and veggies and low in animal meats, cheeses, and dairy products, appropriate calorie intake Continue exercise Follow up at next visit   Pulmonary emphysema, unspecified emphysema type (HCC) Per CXR; Non smoker; improved with trellegy ellipta - given trellegy and breztri  samples due to donut whole; will monitor  Pulmonary nodule CT 07/11/20 stable right middle lobe nodule Repeat CT of chest ordered for next month If stable should need no further imaging Addendum: Pt has declined the additional CT of chest   Abnormal glucose Discussed disease progression and risks Discussed diet/exercise, weight management and risk modification  Vitamin D  deficiency Continue supplement   Medication management -     CBC with Differential/Platelet -     COMPLETE METABOLIC PANEL WITH GFR -     Lipid panel -     Magnesium -     TSH   Facial lesion 2.5 cm lesion left cheek- persistently opens and bleeds- refer to dermatology for further evaluation -     Ambulatory referral to Dermatology     Future Appointments  Date Time Provider Department Center  01/13/2024  9:00 AM Essance Gatti E, NP [facility] None  05/03/2024 11:00 AM Tonita Fallow, MD [facility] None     Subjective:  Carlos Baldwin is a 89 y.o. male who presents for 3 month follow up for HTN, hyperlipidemia, prediabetes, and vitamin D  Def.   He has an area on his left cheek that was a mole removed several years ago.  The area will open and bleed intermittently and is slightly erythematous.   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 He is watching portions. Wt Readings from Last 3 Encounters:  09/17/23 174 lb (78.9 kg)  04/22/23 167 lb 12.8 oz (76.1 kg)  01/13/23 171 lb (77.6 kg)   He has aortic atherosclerosis per CT 11/2019  His blood pressure has been controlled at home on Losartan  100 mg every day , today their BP is BP: 124/78 BP Readings from Last 3 Encounters:  09/17/23 124/78  04/22/23 130/80  01/13/23 132/70  He does workout, he rides bike at home, has retired from police work.   He denies chest pain, shortness of breath, dizziness.   He is not on cholesterol medication and denies myalgias. His cholesterol is at goal. The cholesterol last visit was:   Lab Results  Component Value Date   CHOL 165 04/22/2023   HDL 55 04/22/2023   LDLCALC 93 04/22/2023   TRIG 81 04/22/2023   CHOLHDL 3.0 04/22/2023   He has been working on diet and exercise for prediabetes, and denies paresthesia of the feet, polydipsia and polyuria. Last A1C in the office was:   Lab Results  Component Value Date   HGBA1C 6.2 (H) 04/22/2023   Patient is on Vitamin D  supplement Lab Results  Component Value Date   VD25OH 45 04/22/2023   Lab Results  Component Value Date   EGFR 78 04/22/2023   He gets up 1-2 times a night to urinate, finasteride  is helping.    Medication Review: Current Outpatient Medications  on File Prior to Visit  Medication Sig Dispense Refill   Ascorbic Acid (VITAMIN C) 500 MG CAPS Vitamin C     diclofenac Sodium (VOLTAREN) 1 % GEL APPLY 2 GRAMS TOPICALLY TO THE AFFECTED AREA FOUR TIMES DAILY AS NEEDED FOR PAIN     finasteride  (PROSCAR ) 5 MG tablet TAKE 1 TABLET BY MOUTH DAILY FOR PROSTATE 90 tablet 3   losartan  (COZAAR ) 100 MG tablet TAKE 1 TABLET BY MOUTH DAILY FOR BLOOD PRESSURE 90 tablet 3   multivitamin-lutein (OCUVITE-LUTEIN) CAPS capsule Take 1 capsule by mouth daily.     Polyethyl Glycol-Propyl Glycol (SYSTANE OP) Place  1 drop into both eyes 3 (three) times daily as needed (Dry eyes).      TRELEGY ELLIPTA  200-62.5-25 MCG/ACT AEPB INHALE 1 PUFF BY MOUTH DAILY 180 each 3   No current facility-administered medications on file prior to visit.   Current Problems (verified) Patient Active Problem List   Diagnosis Date Noted   COPD (chronic obstructive pulmonary disease) (HCC) 10/26/2019   Laceration of left hand 09/27/2019   Pain of left hand 09/27/2019   Abnormal glucose 03/07/2019   Medication management 12/21/2013   Essential hypertension 08/02/2013   Hyperlipidemia, mixed 08/02/2013   Vitamin D  deficiency 08/02/2013     Allergies Allergies  Allergen Reactions   Atenolol Other (See Comments)    not sure   Pravastatin     Joint pain   Simvastatin     Joint pain   Sulfa Antibiotics    Sulfonamide Derivatives Swelling and Rash    SURGICAL HISTORY He  has a past surgical history that includes Knee surgery (Right, 1972); left knee arthroscopy (01-14-2012); Total knee arthroplasty (Left, 11/04/2015); and Total knee arthroplasty (Right, 10/11/2017). FAMILY HISTORY His family history is not on file.  SOCIAL HISTORY He  reports that he has never smoked. He has never used smokeless tobacco. He reports that he does not drink alcohol and does not use drugs.   Review of Systems  Constitutional:  Negative for malaise/fatigue and weight loss.  HENT:  Negative for hearing loss and tinnitus.   Eyes:  Negative for blurred vision and double vision.  Respiratory:  Negative for cough, shortness of breath and wheezing.   Cardiovascular:  Negative for chest pain, palpitations, orthopnea, claudication and leg swelling.  Gastrointestinal:  Negative for abdominal pain, blood in stool, constipation, diarrhea, heartburn, melena, nausea and vomiting.  Genitourinary: Negative.   Musculoskeletal:  Negative for joint pain and myalgias.  Skin:  Negative for rash.       Lesion on left cheek  Neurological:  Negative for  dizziness, tingling, sensory change, weakness and headaches.  Endo/Heme/Allergies:  Negative for polydipsia.  Psychiatric/Behavioral: Negative.    All other systems reviewed and are negative.    Objective:   Blood pressure 124/78, pulse 98, temperature (!) 96.5 F (35.8 C), height 5' 9 (1.753 m), weight 174 lb (78.9 kg), SpO2 99%. Body mass index is 25.7 kg/m.  General appearance: alert, no distress, WD/WN, male HEENT: normocephalic, sclerae anicteric, TMs pearly, nares patent, no discharge or erythema, pharynx normal Oral cavity: MMM, no lesions Neck: supple, no lymphadenopathy, no thyromegaly, no masses Heart: RRR, distant heart sounds, normal S1, S2, no murmurs Lungs: CTA bilaterally, slight barrel chest, no wheezes, rhonchi, or rales Abdomen: +bs, soft, non tender, non distended, no masses, no hepatomegaly, no splenomegaly Musculoskeletal: nontender, no swelling, no obvious deformity  Extremities: no edema, no cyanosis, no clubbing Skin: left cheek 2.5 cm excoriated area that  is erythematous with small amount of blood Pulses: 2+ symmetric, upper and lower extremities, normal cap refill Neurological: alert, oriented x 3, CN2-12 intact, strength normal upper extremities and lower extremities, sensation normal throughout, DTRs 2+ throughout, no cerebellar signs, gait antalgic due to knees Psychiatric: normal affect, behavior normal, pleasant    LONELL FORBES [place], NP   09/17/2023

## 2023-09-17 ENCOUNTER — Ambulatory Visit (INDEPENDENT_AMBULATORY_CARE_PROVIDER_SITE_OTHER): Payer: Medicare Other | Admitting: Nurse Practitioner

## 2023-09-17 ENCOUNTER — Encounter: Payer: Self-pay | Admitting: Nurse Practitioner

## 2023-09-17 VITALS — BP 124/78 | HR 98 | Temp 96.5°F | Ht 69.0 in | Wt 174.0 lb

## 2023-09-17 DIAGNOSIS — Z79899 Other long term (current) drug therapy: Secondary | ICD-10-CM | POA: Diagnosis not present

## 2023-09-17 DIAGNOSIS — E782 Mixed hyperlipidemia: Secondary | ICD-10-CM

## 2023-09-17 DIAGNOSIS — I700 Atherosclerosis of aorta: Secondary | ICD-10-CM

## 2023-09-17 DIAGNOSIS — J439 Emphysema, unspecified: Secondary | ICD-10-CM | POA: Diagnosis not present

## 2023-09-17 DIAGNOSIS — I10 Essential (primary) hypertension: Secondary | ICD-10-CM | POA: Diagnosis not present

## 2023-09-17 DIAGNOSIS — L989 Disorder of the skin and subcutaneous tissue, unspecified: Secondary | ICD-10-CM | POA: Diagnosis not present

## 2023-09-17 DIAGNOSIS — R7309 Other abnormal glucose: Secondary | ICD-10-CM | POA: Diagnosis not present

## 2023-09-17 DIAGNOSIS — E559 Vitamin D deficiency, unspecified: Secondary | ICD-10-CM

## 2023-09-17 DIAGNOSIS — E663 Overweight: Secondary | ICD-10-CM

## 2023-09-17 NOTE — Patient Instructions (Signed)

## 2023-09-18 LAB — LIPID PANEL
Cholesterol: 175 mg/dL (ref <200)
HDL: 64 mg/dL (ref >=40)
LDL Cholesterol (Calc): 96 mg/dL
Non-HDL Cholesterol (Calc): 111 mg/dL (ref <130)
Total CHOL/HDL Ratio: 2.7 (calc) (ref <5.0)
Triglycerides: 66 mg/dL (ref <150)

## 2023-09-18 LAB — COMPLETE METABOLIC PANEL WITH GFR
AG Ratio: 1.8 (calc) (ref 1.0–2.5)
ALT: 18 U/L (ref 9–46)
AST: 16 U/L (ref 10–35)
Albumin: 4.2 g/dL (ref 3.6–5.1)
Alkaline phosphatase (APISO): 76 U/L (ref 35–144)
BUN: 20 mg/dL (ref 7–25)
CO2: 30 mmol/L (ref 20–32)
Calcium: 9.5 mg/dL (ref 8.6–10.3)
Chloride: 106 mmol/L (ref 98–110)
Creat: 1.05 mg/dL (ref 0.70–1.22)
Globulin: 2.4 g/dL (ref 1.9–3.7)
Glucose, Bld: 92 mg/dL (ref 65–99)
Potassium: 4.4 mmol/L (ref 3.5–5.3)
Sodium: 142 mmol/L (ref 135–146)
Total Bilirubin: 0.4 mg/dL (ref 0.2–1.2)
Total Protein: 6.6 g/dL (ref 6.1–8.1)
eGFR: 69 mL/min/{1.73_m2} (ref >=60)

## 2023-09-18 LAB — CBC WITH DIFFERENTIAL/PLATELET
Absolute Lymphocytes: 1385 {cells}/uL (ref 850–3900)
Absolute Monocytes: 660 {cells}/uL (ref 200–950)
Basophils Absolute: 71 {cells}/uL (ref 0–200)
Basophils Relative: 1 %
Eosinophils Absolute: 149 {cells}/uL (ref 15–500)
Eosinophils Relative: 2.1 %
HCT: 42.5 % (ref 38.5–50.0)
Hemoglobin: 14.2 g/dL (ref 13.2–17.1)
MCH: 30.1 pg (ref 27.0–33.0)
MCHC: 33.4 g/dL (ref 32.0–36.0)
MCV: 90 fL (ref 80.0–100.0)
MPV: 8.9 fL (ref 7.5–12.5)
Monocytes Relative: 9.3 %
Neutro Abs: 4835 {cells}/uL (ref 1500–7800)
Neutrophils Relative %: 68.1 %
Platelets: 281 10*3/uL (ref 140–400)
RBC: 4.72 10*6/uL (ref 4.20–5.80)
RDW: 13 % (ref 11.0–15.0)
Total Lymphocyte: 19.5 %
WBC: 7.1 10*3/uL (ref 3.8–10.8)

## 2023-09-18 LAB — MAGNESIUM: Magnesium: 1.9 mg/dL (ref 1.5–2.5)

## 2023-09-18 LAB — TSH: TSH: 2 m[IU]/L (ref 0.40–4.50)

## 2023-10-11 ENCOUNTER — Other Ambulatory Visit: Payer: Self-pay

## 2023-10-11 MED ORDER — FINASTERIDE 5 MG PO TABS: ORAL_TABLET | ORAL | 3 refills | Status: AC

## 2023-10-12 DIAGNOSIS — L570 Actinic keratosis: Secondary | ICD-10-CM | POA: Diagnosis not present

## 2023-10-12 DIAGNOSIS — C44319 Basal cell carcinoma of skin of other parts of face: Secondary | ICD-10-CM | POA: Diagnosis not present

## 2023-10-12 DIAGNOSIS — D492 Neoplasm of unspecified behavior of bone, soft tissue, and skin: Secondary | ICD-10-CM | POA: Diagnosis not present

## 2023-10-12 DIAGNOSIS — L821 Other seborrheic keratosis: Secondary | ICD-10-CM | POA: Diagnosis not present

## 2023-12-15 ENCOUNTER — Encounter: Payer: Self-pay | Admitting: Family Medicine

## 2023-12-15 ENCOUNTER — Ambulatory Visit (INDEPENDENT_AMBULATORY_CARE_PROVIDER_SITE_OTHER): Payer: Medicare Other | Admitting: Family Medicine

## 2023-12-15 VITALS — BP 122/74 | HR 91 | Temp 97.8°F | Ht 69.0 in | Wt 175.0 lb

## 2023-12-15 DIAGNOSIS — E782 Mixed hyperlipidemia: Secondary | ICD-10-CM

## 2023-12-15 DIAGNOSIS — I700 Atherosclerosis of aorta: Secondary | ICD-10-CM

## 2023-12-15 DIAGNOSIS — E559 Vitamin D deficiency, unspecified: Secondary | ICD-10-CM | POA: Diagnosis not present

## 2023-12-15 DIAGNOSIS — I10 Essential (primary) hypertension: Secondary | ICD-10-CM

## 2023-12-15 DIAGNOSIS — Q600 Renal agenesis, unilateral: Secondary | ICD-10-CM | POA: Insufficient documentation

## 2023-12-15 NOTE — Progress Notes (Signed)
 New Patient Office Visit  Subjective    Patient ID: Carlos Baldwin, male    DOB: [birth year]  Age: 89 y.o. MRN: [id number]  CC:  Chief Complaint  Patient presents with   Establish Care    No concerns    HPI Carlos Baldwin presents to establish care Previous PCP Dr. Oneta Rack   PMH: HTN, prediabetes, HLD and vitamin D def, aortic atherosclerosis and COPD per chest X ray 2021.   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   One kidney   Married  Works as Office manager at [employer] Medications as of 12/15/2023  Medication Sig   Ascorbic Acid (VITAMIN C) 500 MG CAPS Vitamin C   diclofenac Sodium (VOLTAREN) 1 % GEL APPLY 2 GRAMS TOPICALLY TO THE AFFECTED AREA FOUR TIMES DAILY AS NEEDED FOR PAIN   finasteride (PROSCAR) 5 MG tablet TAKE 1 TABLET BY MOUTH DAILY FOR PROSTATE   losartan (COZAAR) 100 MG tablet TAKE 1 TABLET BY MOUTH DAILY FOR BLOOD PRESSURE   multivitamin-lutein (OCUVITE-LUTEIN) CAPS capsule Take 1 capsule by mouth daily.   Polyethyl Glycol-Propyl Glycol (SYSTANE OP) Place 1 drop into both eyes 3 (three) times daily as needed (Dry eyes).    TRELEGY ELLIPTA 200-62.5-25 MCG/ACT AEPB INHALE 1 PUFF BY MOUTH DAILY   No facility-administered encounter medications on file as of 12/15/2023.    Past Medical History:  Diagnosis Date   Arthritis    oa   Asthma    as child   Bladder neck obstruction 08/02/2013   Colon polyps    Hyperlipidemia    Hypertension    Measles    as child   Mumps    as child   Positive colorectal cancer screening using Cologuard test 07/17/2019   Pre-diabetes     Past Surgical History:  Procedure Laterality Date   KNEE SURGERY Right 1972   cartlidge repair   left knee arthroscopy  01-14-2012   TOTAL KNEE ARTHROPLASTY Left 11/04/2015   Procedure: LEFT TOTAL KNEE ARTHROPLASTY;  Surgeon: Ollen Gross, MD;  Location: [facility];  Service: Orthopedics;  Laterality:  Left;   TOTAL KNEE ARTHROPLASTY Right 10/11/2017   Procedure: RIGHT TOTAL KNEE ARTHROPLASTY;  Surgeon: Ollen Gross, MD;  Location: [facility];  Service: Orthopedics;  Laterality: Right;    Family History  Problem Relation Age of Onset   Colon cancer Neg Hx    Esophageal cancer Neg Hx    Liver cancer Neg Hx    Stomach cancer Neg Hx     Social History   Socioeconomic History   Marital status: Married    Spouse name: Not on file   Number of children: Not on file   Years of education: Not on file   Highest education level: Not on file  Occupational History   Not on file  Tobacco Use   Smoking status: Never   Smokeless tobacco: Never  Vaping Use   Vaping status: Never Used  Substance and Sexual Activity   Alcohol use: No   Drug use: No   Sexual activity: Not on file  Other Topics Concern   Not on file  Social History Narrative   Not on file   Social Drivers of Health   Financial Resource Strain: Not on file  Food Insecurity: Not on file  Transportation Needs: Not on file  Physical Activity: Not on file  Stress: Not on  file  Social Connections: Not on file  Intimate Partner Violence: Not on file    Review of Systems  Constitutional:  Negative for chills, fever, malaise/fatigue and weight loss.  Respiratory:  Negative for shortness of breath.   Cardiovascular:  Negative for chest pain, palpitations and leg swelling.  Gastrointestinal:  Negative for abdominal pain, constipation, diarrhea, nausea and vomiting.  Genitourinary:  Negative for dysuria, frequency and urgency.  Musculoskeletal:  Negative for falls and joint pain.  Neurological:  Negative for dizziness, focal weakness and headaches.  Psychiatric/Behavioral:  Negative for depression. The patient is not nervous/anxious.         Objective    BP 122/74 (BP Location: Left Arm, Patient Position: Sitting)   Pulse 91   Temp 97.8 F (36.6 C) (Temporal)   Ht 5\' 9"  (1.753 m)   Wt 175 lb (79.4 kg)   SpO2 97%    BMI 25.84 kg/m   Physical Exam Constitutional:      General: He is not in acute distress.    Appearance: He is not ill-appearing.  Eyes:     Extraocular Movements: Extraocular movements intact.     Conjunctiva/sclera: Conjunctivae normal.  Cardiovascular:     Rate and Rhythm: Normal rate and regular rhythm.  Pulmonary:     Effort: Pulmonary effort is normal.     Breath sounds: Normal breath sounds.  Musculoskeletal:     Cervical back: Normal range of motion and neck supple.  Skin:    General: Skin is warm and dry.  Neurological:     General: No focal deficit present.     Mental Status: He is alert and oriented to person, place, and time.     Motor: No weakness.     Coordination: Coordination normal.     Gait: Gait normal.  Psychiatric:        Mood and Affect: Mood normal.        Behavior: Behavior normal.        Thought Content: Thought content normal.         Assessment & Plan:   Problem List Items Addressed This Visit     Aortic atherosclerosis (HCC) by Chest CT scan Nov 2021   Congenital single kidney   Essential hypertension - Primary   Hyperlipidemia, mixed   Vitamin D deficiency   Reviewed previous PCP notes and labs.  UTD on CPE Here today to establish care.  Continue current medications. He is taking good care of himself.  Follow up when due for next fasting CPE  Return in about 4 months (around 04/25/2024) for fasting CPE.   Hetty Blend, NP-C

## 2023-12-15 NOTE — Patient Instructions (Signed)
 Thank you for trusting [place] with your healthcare.   I will see you back when you are due for your next fasting physical exam in August.

## 2024-01-13 ENCOUNTER — Ambulatory Visit: Payer: Medicare Other | Admitting: Nurse Practitioner

## 2024-02-17 ENCOUNTER — Ambulatory Visit

## 2024-02-17 VITALS — Ht 68.0 in | Wt 175.0 lb

## 2024-02-17 DIAGNOSIS — Z0000 Encounter for general adult medical examination without abnormal findings: Secondary | ICD-10-CM

## 2024-02-17 NOTE — Patient Instructions (Signed)
 Mr. Carlos Baldwin , Thank you for taking time out of your busy schedule to complete your Annual Wellness Visit with me. I enjoyed our conversation and look forward to speaking with you again next year. I, as well as your care team,  appreciate your ongoing commitment to your health goals. Please review the following plan we discussed and let me know if I can assist you in the future. Your Game plan/ To Do List    Follow up Visits: Next Medicare AWV with our clinical staff: 02/20/2024   Have you seen your provider in the last 6 months (3 months if uncontrolled diabetes)? No Next Office Visit with your provider: 04/18/2024  Clinician Recommendations:  Aim for 30 minutes of exercise or brisk walking, 6-8 glasses of water , and 5 servings of fruits and vegetables each day. Educated and advised on getting the Shingles vaccines in 2025.       This is a list of the screening recommended for you and due dates:  Health Maintenance  Topic Date Due   Zoster (Shingles) Vaccine (1 of 2) 07/27/1954   COVID-19 Vaccine (3 - Pfizer risk series) 01/09/2020   Flu Shot  04/07/2024   Medicare Annual Wellness Visit  02/16/2025   DTaP/Tdap/Td vaccine (3 - Td or Tdap) 09/17/2029   Pneumococcal Vaccine for age over 59  Completed   HPV Vaccine  Aged Out   Meningitis B Vaccine  Aged Out    Advanced directives: (Copy Requested) Please bring a copy of your health care power of attorney and living will to the office to be added to your chart at your convenience. You can mail to [facility] [address] [place], [place] [address] or email to [email or web address] Advance Care Planning is important because it:  [x]  Makes sure you receive the medical care that is consistent with your values, goals, and preferences  [x]  It provides guidance to your family and loved ones and reduces their decisional burden about whether or not they are making the right decisions based on your wishes.  Follow the link  provided in your after visit summary or read over the paperwork we have mailed to you to help you started getting your Advance Directives in place. If you need assistance in completing these, please reach out to us  so that we can help you!

## 2024-02-17 NOTE — Progress Notes (Signed)
 Subjective:  Please attest and cosign this visit due to patients primary care provider not being in the office at the time the visit was completed.  (Pt of Carlos Back, NP)   Carlos Baldwin is a 89 y.o. who presents for a Medicare Wellness preventive visit.  As a reminder, Annual Wellness Visits don't include a physical exam, and some assessments may be limited, especially if this visit is performed virtually. We may recommend an in-person follow-up visit with your provider if needed.  Visit Complete: Virtual I connected with  [name] on 02/17/24 by a audio enabled telemedicine application and verified that I am speaking with the correct person using two identifiers.  Patient Location: Home  Provider Location: Office/Clinic  I discussed the limitations of evaluation and management by telemedicine. The patient expressed understanding and agreed to proceed.  Vital Signs: Because this visit was a virtual/telehealth visit, some criteria may be missing or patient reported. Any vitals not documented were not able to be obtained and vitals that have been documented are patient reported.  VideoDeclined- This patient declined Librarian, academic. Therefore the visit was completed with audio only.  Persons Participating in Visit: Patient.  AWV Questionnaire: No: Patient Medicare AWV questionnaire was not completed prior to this visit.  Cardiac Risk Factors include: advanced age (>68men, >59 women);dyslipidemia;hypertension;male gender     Objective:    Today's Vitals   02/17/24 1425  Weight: 175 lb (79.4 kg)  Height: 5' 8 (1.727 m)   Body mass index is 26.61 kg/m.     02/17/2024    2:25 PM 01/13/2023    9:44 AM 07/07/2022   10:54 AM 10/26/2019    9:33 AM 09/18/2019    3:38 PM 06/08/2019   10:13 AM 08/16/2018    2:16 PM  Advanced Directives  Does Patient Have a Medical Advance Directive? Yes Yes Yes Yes Yes Yes Yes   Type of Sports coach of [place];Living will  Healthcare Power of [place];Living will Healthcare Power of [place];Living will Healthcare Power of [place];Living will Healthcare Power of [place];Living will Healthcare Power of [place];Living will  Does patient want to make changes to medical advance directive?  No - Patient declined No - Patient declined No - Patient declined  No - Patient declined No - Patient declined   Copy of Healthcare Power of Attorney in Chart? No - copy requested  No - copy requested No - copy requested  No - copy requested No - copy requested   Would patient like information on creating a medical advance directive?       No - Patient declined      Data saved with a previous flowsheet row definition    Current Medications (verified) Outpatient Encounter Medications as of 02/17/2024  Medication Sig   Ascorbic Acid (VITAMIN C) 500 MG CAPS Vitamin C   diclofenac Sodium (VOLTAREN) 1 % GEL APPLY 2 GRAMS TOPICALLY TO THE AFFECTED AREA FOUR TIMES DAILY AS NEEDED FOR PAIN   finasteride  (PROSCAR ) 5 MG tablet TAKE 1 TABLET BY MOUTH DAILY FOR PROSTATE   losartan  (COZAAR ) 100 MG tablet TAKE 1 TABLET BY MOUTH DAILY FOR BLOOD PRESSURE   multivitamin-lutein (OCUVITE-LUTEIN) CAPS capsule Take 1 capsule by mouth daily.   Polyethyl Glycol-Propyl Glycol (SYSTANE OP) Place 1 drop into both eyes 3 (three) times daily as needed (Dry eyes).    TRELEGY ELLIPTA  200-62.5-25 MCG/ACT AEPB INHALE 1 PUFF BY MOUTH DAILY   No facility-administered encounter medications  on file as of 02/17/2024.    Allergies (verified) Atenolol, Pravastatin, Simvastatin, Sulfa antibiotics, and Sulfonamide derivatives   History: Past Medical History:  Diagnosis Date   Arthritis    oa   Asthma    as child   Bladder neck obstruction 08/02/2013   Colon polyps    Hyperlipidemia    Hypertension    Measles    as child   Mumps    as child   Positive colorectal cancer screening using Cologuard test  07/17/2019   Pre-diabetes    Past Surgical History:  Procedure Laterality Date   KNEE SURGERY Right 1972   cartlidge repair   left knee arthroscopy  01-14-2012   TOTAL KNEE ARTHROPLASTY Left 11/04/2015   Procedure: LEFT TOTAL KNEE ARTHROPLASTY;  Surgeon: Liliane Rei, MD;  Location: [facility];  Service: Orthopedics;  Laterality: Left;   TOTAL KNEE ARTHROPLASTY Right 10/11/2017   Procedure: RIGHT TOTAL KNEE ARTHROPLASTY;  Surgeon: Liliane Rei, MD;  Location: [facility];  Service: Orthopedics;  Laterality: Right;   Family History  Problem Relation Age of Onset   Colon cancer Neg Hx    Esophageal cancer Neg Hx    Liver cancer Neg Hx    Stomach cancer Neg Hx    Social History   Socioeconomic History   Marital status: Married    Spouse name: Not on file   Number of children: Not on file   Years of education: Not on file   Highest education level: Not on file  Occupational History   Not on file  Tobacco Use   Smoking status: Never   Smokeless tobacco: Never  Vaping Use   Vaping status: Never Used  Substance and Sexual Activity   Alcohol use: No   Drug use: No   Sexual activity: Not Currently  Other Topics Concern   Not on file  Social History Narrative   Not on file   Social Drivers of Health   Financial Resource Strain: Low Risk  (02/17/2024)   Overall Financial Resource Strain (CARDIA)    Difficulty of Paying Living Expenses: Not hard at all  Food Insecurity: No Food Insecurity (02/17/2024)   Hunger Vital Sign    Worried About Running Out of Food in the Last Year: Never true    Ran Out of Food in the Last Year: Never true  Transportation Needs: No Transportation Needs (02/17/2024)   PRAPARE - Administrator, Civil Service (Medical): No    Lack of Transportation (Non-Medical): No  Physical Activity: Insufficiently Active (02/17/2024)   Exercise Vital Sign    Days of Exercise per Week: 3 days    Minutes of Exercise per Session: 20 min  Stress: No Stress Concern  Present (02/17/2024)   [employer] of Occupational Health - Occupational Stress Questionnaire    Feeling of Stress: Not at all  Social Connections: Socially Integrated (02/17/2024)   Social Connection and Isolation Panel    Frequency of Communication with Friends and Family: More than three times a week    Frequency of Social Gatherings with Friends and Family: More than three times a week    Attends Religious Services: More than 4 times per year    Active Member of [employer] or Organizations: Yes    Attends Engineer, structural: More than 4 times per year    Marital Status: Married    Tobacco Counseling Counseling given: No    Clinical Intake:  Pre-visit preparation completed: Yes  Pain : No/denies pain  BMI - recorded: 26.61 Nutritional Status: BMI 25 -29 Overweight Nutritional Risks: None Diabetes: No  Lab Results  Component Value Date   HGBA1C 6.2 (H) 04/22/2023   HGBA1C 6.2 (H) 10/12/2022   HGBA1C 5.8 (H) 03/25/2022     How often do you need to have someone help you when you read instructions, pamphlets, or other written materials from your doctor or pharmacy?: 1 - Never  Interpreter Needed?: No  Information entered by :: Kandy Orris, CMA   Activities of Daily Living     02/17/2024    2:27 PM 04/21/2023   11:11 PM  In your present state of health, do you have any difficulty performing the following activities:  Hearing? 0 0  Vision? 0 0  Difficulty concentrating or making decisions? 0 0  Walking or climbing stairs? 0 0  Dressing or bathing? 0 0  Doing errands, shopping? 0 0  Preparing Food and eating ? N   Using the Toilet? N   In the past six months, have you accidently leaked urine? N   Do you have problems with loss of bowel control? N   Managing your Medications? N   Managing your Finances? N   Housekeeping or managing your Housekeeping? N     Patient Care Team: Abram Abraham, NP-C as PCP - General (Family  Medicine) Shirlee Dotter, MD (Inactive) as Consulting Physician (Orthopedic Surgery) Asencion Blacksmith, MD (Inactive) as Consulting Physician (Gastroenterology) Clary Crown, DDS as Consulting Physician (Dentistry) Dema Filler, MD as Consulting Physician (Ophthalmology)  I have updated your Care Teams any recent Medical Services you may have received from other providers in the past year.     Assessment:   This is a routine wellness examination for Carlos Baldwin.  Hearing/Vision screen Hearing Screening - Comments:: Wears hearing aids Vision Screening - Comments:: Wears rx glasses - appt in 05/2024 w/Dr McCuen   Goals Addressed               This Visit's Progress     Patient Stated (pt-stated)        Patient stated still works and exercise       Depression Screen     02/17/2024    2:30 PM 12/15/2023   11:12 AM 04/21/2023   11:11 PM 01/13/2023    9:45 AM 10/12/2022    7:10 PM 07/07/2022   10:54 AM 03/25/2022   12:17 AM  PHQ 2/9 Scores  PHQ - 2 Score 0 0 0 0 0 0 0  PHQ- 9 Score 0          Fall Risk     02/17/2024    2:29 PM 12/15/2023   11:12 AM 04/21/2023   11:11 PM 01/13/2023    9:44 AM 10/12/2022    7:10 PM  Fall Risk   Falls in the past year? 0 0 0 0 0  Number falls in past yr: 0 0  0   Injury with Fall? 0 0  0   Risk for fall due to : No Fall Risks No Fall Risks No Fall Risks No Fall Risks No Fall Risks  Follow up Falls evaluation completed;Falls prevention discussed Falls evaluation completed Falls prevention discussed;Education provided;Falls evaluation completed Falls evaluation completed;Falls prevention discussed Falls evaluation completed;Education provided;Falls prevention discussed    MEDICARE RISK AT HOME:  Medicare Risk at Home Any stairs in or around the home?: No If so, are there any without handrails?: No Home free of loose throw rugs in walkways,  pet beds, electrical cords, etc?: Yes Adequate lighting in your home to reduce risk of falls?:  Yes Life alert?: No Use of a cane, walker or w/c?: No Grab bars in the bathroom?: Yes Shower chair or bench in shower?: No Elevated toilet seat or a handicapped toilet?: No  TIMED UP AND GO:  Was the test performed?  No  Cognitive Function: 6CIT completed        02/17/2024    2:32 PM  6CIT Screen  What Year? 0 points  What month? 0 points  What time? 0 points  Count Baldwin from 20 0 points  Months in reverse 0 points  Repeat phrase 0 points  Total Score 0 points    Immunizations Immunization History  Administered Date(s) Administered   Influenza, High Dose Seasonal PF 06/03/2018, 05/25/2019, 05/07/2022   Influenza-Unspecified 06/06/2020   PFIZER(Purple Top)SARS-COV-2 Vaccination 11/12/2019, 12/12/2019   Pneumococcal Conjugate-13 06/27/2014   Pneumococcal Polysaccharide-23 09/07/2006   Td 03/08/2019   Tdap 09/18/2019   Zoster, Live 09/07/2008    Screening Tests Health Maintenance  Topic Date Due   Zoster Vaccines- Shingrix (1 of 2) 07/27/1954   COVID-19 Vaccine (3 - Pfizer risk series) 01/09/2020   INFLUENZA VACCINE  04/07/2024   Medicare Annual Wellness (AWV)  02/16/2025   DTaP/Tdap/Td (3 - Td or Tdap) 09/17/2029   Pneumococcal Vaccine: 50+ Years  Completed   HPV VACCINES  Aged Out   Meningococcal B Vaccine  Aged Out    Health Maintenance  Health Maintenance Due  Topic Date Due   Zoster Vaccines- Shingrix (1 of 2) 07/27/1954   COVID-19 Vaccine (3 - Pfizer risk series) 01/09/2020   Health Maintenance Items Addressed: 02/17/2024   Additional Screening:  Vision Screening: Recommended annual ophthalmology exams for early detection of glaucoma and other disorders of the eye. Would you like a referral to an eye doctor? No    Dental Screening: Recommended annual dental exams for proper oral hygiene  Community Resource Referral / Chronic Care Management: CRR required this visit?  No   CCM required this visit?  No   Plan:    I have personally reviewed  and noted the following in the patient's chart:   Medical and social history Use of alcohol, tobacco or illicit drugs  Current medications and supplements including opioid prescriptions. Patient is not currently taking opioid prescriptions. Functional ability and status Nutritional status Physical activity Advanced directives List of other physicians Hospitalizations, surgeries, and ER visits in previous 12 months Vitals Screenings to include cognitive, depression, and falls Referrals and appointments  In addition, I have reviewed and discussed with patient certain preventive protocols, quality metrics, and best practice recommendations. A written personalized care plan for preventive services as well as general preventive health recommendations were provided to patient.   Patria Bookbinder, CMA   02/17/2024   After Visit Summary: (MyChart) Due to this being a telephonic visit, the after visit summary with patients personalized plan was offered to patient via MyChart   Notes: Nothing significant to report at this time.

## 2024-03-09 ENCOUNTER — Encounter (HOSPITAL_COMMUNITY): Payer: Self-pay

## 2024-03-09 ENCOUNTER — Other Ambulatory Visit: Payer: Self-pay

## 2024-03-09 ENCOUNTER — Observation Stay (HOSPITAL_COMMUNITY)
Admission: EM | Admit: 2024-03-09 | Discharge: 2024-03-10 | Disposition: A | Discharge status: Home / Self Care | Attending: Student | Admitting: Student

## 2024-03-09 ENCOUNTER — Emergency Department (HOSPITAL_COMMUNITY)

## 2024-03-09 DIAGNOSIS — Z6825 Body mass index (BMI) 25.0-25.9, adult: Secondary | ICD-10-CM | POA: Insufficient documentation

## 2024-03-09 DIAGNOSIS — R918 Other nonspecific abnormal finding of lung field: Secondary | ICD-10-CM | POA: Diagnosis not present

## 2024-03-09 DIAGNOSIS — E119 Type 2 diabetes mellitus without complications: Secondary | ICD-10-CM | POA: Insufficient documentation

## 2024-03-09 DIAGNOSIS — J449 Chronic obstructive pulmonary disease, unspecified: Secondary | ICD-10-CM | POA: Insufficient documentation

## 2024-03-09 DIAGNOSIS — I951 Orthostatic hypotension: Secondary | ICD-10-CM | POA: Insufficient documentation

## 2024-03-09 DIAGNOSIS — E785 Hyperlipidemia, unspecified: Secondary | ICD-10-CM | POA: Diagnosis not present

## 2024-03-09 DIAGNOSIS — I700 Atherosclerosis of aorta: Secondary | ICD-10-CM | POA: Insufficient documentation

## 2024-03-09 DIAGNOSIS — I10 Essential (primary) hypertension: Secondary | ICD-10-CM | POA: Diagnosis not present

## 2024-03-09 DIAGNOSIS — R55 Syncope and collapse: Secondary | ICD-10-CM | POA: Diagnosis present

## 2024-03-09 LAB — CBC WITH DIFFERENTIAL/PLATELET
Abs Immature Granulocytes: 0.14 10*3/uL — ABNORMAL HIGH (ref 0.00–0.07)
Basophils Absolute: 0.1 10*3/uL (ref 0.0–0.1)
Basophils Relative: 1 %
Eosinophils Absolute: 0 10*3/uL (ref 0.0–0.5)
Eosinophils Relative: 0 %
HCT: 41.2 % (ref 39.0–52.0)
Hemoglobin: 13.5 g/dL (ref 13.0–17.0)
Immature Granulocytes: 1 %
Lymphocytes Relative: 7 %
Lymphs Abs: 1 10*3/uL (ref 0.7–4.0)
MCH: 30.3 pg (ref 26.0–34.0)
MCHC: 32.8 g/dL (ref 30.0–36.0)
MCV: 92.6 fL (ref 80.0–100.0)
Monocytes Absolute: 0.8 10*3/uL (ref 0.1–1.0)
Monocytes Relative: 6 %
Neutro Abs: 13 10*3/uL — ABNORMAL HIGH (ref 1.7–7.7)
Neutrophils Relative %: 85 %
Platelets: 250 10*3/uL (ref 150–400)
RBC: 4.45 MIL/uL (ref 4.22–5.81)
RDW: 13.8 % (ref 11.5–15.5)
WBC: 15 10*3/uL — ABNORMAL HIGH (ref 4.0–10.5)
nRBC: 0 % (ref 0.0–0.2)

## 2024-03-09 LAB — URINALYSIS, ROUTINE W REFLEX MICROSCOPIC
Bilirubin Urine: NEGATIVE
Glucose, UA: NEGATIVE mg/dL
Hgb urine dipstick: NEGATIVE
Ketones, ur: 20 mg/dL — AB
Leukocytes,Ua: NEGATIVE
Nitrite: NEGATIVE
Protein, ur: NEGATIVE mg/dL
Specific Gravity, Urine: 1.016 (ref 1.005–1.030)
pH: 6 (ref 5.0–8.0)

## 2024-03-09 LAB — COMPREHENSIVE METABOLIC PANEL WITH GFR
ALT: 17 U/L (ref 0–44)
AST: 18 U/L (ref 15–41)
Albumin: 3.8 g/dL (ref 3.5–5.0)
Alkaline Phosphatase: 61 U/L (ref 38–126)
Anion gap: 12 (ref 5–15)
BUN: 22 mg/dL (ref 8–23)
CO2: 24 mmol/L (ref 22–32)
Calcium: 9.4 mg/dL (ref 8.9–10.3)
Chloride: 106 mmol/L (ref 98–111)
Creatinine, Ser: 1.17 mg/dL (ref 0.61–1.24)
GFR, Estimated: >60 mL/min (ref >60)
Glucose, Bld: 158 mg/dL — ABNORMAL HIGH (ref 70–99)
Potassium: 4.1 mmol/L (ref 3.5–5.1)
Sodium: 142 mmol/L (ref 135–145)
Total Bilirubin: 0.5 mg/dL (ref 0.0–1.2)
Total Protein: 6.5 g/dL (ref 6.5–8.1)

## 2024-03-09 LAB — CBG MONITORING, ED: Glucose-Capillary: 156 mg/dL — ABNORMAL HIGH (ref 70–99)

## 2024-03-09 MED ORDER — ONDANSETRON HCL 4 MG/2ML IJ SOLN
4.0000 mg | INTRAMUSCULAR | Status: AC
  Administered 2024-03-09: 4 mg via INTRAVENOUS
  Filled 2024-03-09: qty 2

## 2024-03-09 NOTE — ED Triage Notes (Signed)
 Pt BIB EMS from work with CC of syncopal episodes at work. +LOC. Denies fall. Denies Cardiac hx. EMS reports intermittent bradycardia in the 40's. AOx4

## 2024-03-09 NOTE — ED Provider Notes (Signed)
 [place] EMERGENCY DEPARTMENT AT [facility] Provider Note   CSN: [id number] Arrival date & time: 03/09/24  1839     Patient presents with: Loss of Consciousness   Carlos Baldwin is a 89 y.o. male who presents to the ED today with primary concern of a syncopal event that happened while he was at work.  According to the patient he suddenly became weak, felt that he was lucid but according to bystanders was unresponsive.  Denies any falls or traumatic injuries, denies having any chest pain or any shortness of breath.  Of note, he has a previous medical history of primary hypertension, hyperlipidemia, COPD.  He denies any recent changes to his medication regimen.  EMS states that this patient's heart rate has fluctuated in the low range, with last heart rate recorded in the 50s.  Review of previous family practice records from April of this year shows that he had normal heart rate and normal blood pressure at that time.    Loss of Consciousness      Prior to Admission medications   Medication Sig Start Date End Date Taking? Authorizing Provider  Ascorbic Acid (VITAMIN C) 500 MG CAPS Vitamin C    [provider]  diclofenac Sodium (VOLTAREN) 1 % GEL APPLY 2 GRAMS TOPICALLY TO THE AFFECTED AREA FOUR TIMES DAILY AS NEEDED FOR PAIN    [provider]  finasteride  (PROSCAR ) 5 MG tablet TAKE 1 TABLET BY MOUTH DAILY FOR PROSTATE 10/11/23   Cranford, Tonya, NP  losartan  (COZAAR ) 100 MG tablet TAKE 1 TABLET BY MOUTH DAILY FOR BLOOD PRESSURE 08/11/23   Cranford, Tonya, NP  multivitamin-lutein (OCUVITE-LUTEIN) CAPS capsule Take 1 capsule by mouth daily.    [provider]  Polyethyl Glycol-Propyl Glycol (SYSTANE OP) Place 1 drop into both eyes 3 (three) times daily as needed (Dry eyes).     [provider]  TRELEGY ELLIPTA  200-62.5-25 MCG/ACT AEPB INHALE 1 PUFF BY MOUTH DAILY 07/05/23   Cranford, Tonya, NP    Allergies: Atenolol, Pravastatin,  Simvastatin, Sulfa antibiotics, and Sulfonamide derivatives    Review of Systems  Cardiovascular:  Positive for syncope.  Neurological:  Positive for syncope.  All other systems reviewed and are negative.   Updated Vital Signs BP (!) 172/101   Pulse 85   Temp 98.4 F (36.9 C) (Oral)   Resp 15   Ht 5' 9 (1.753 m)   Wt 79.4 kg   SpO2 94%   BMI 25.84 kg/m   Physical Exam Vitals and nursing note reviewed.  Constitutional:      General: He is not in acute distress.    Appearance: Normal appearance.  HENT:     Head: Normocephalic and atraumatic.     Mouth/Throat:     Mouth: Mucous membranes are moist.     Pharynx: Oropharynx is clear.  Eyes:     General: No visual field deficit.    Extraocular Movements: Extraocular movements intact.     Conjunctiva/sclera: Conjunctivae normal.     Pupils: Pupils are equal, round, and reactive to light.  Cardiovascular:     Rate and Rhythm: Normal rate and regular rhythm.     Pulses: Normal pulses.     Heart sounds: Normal heart sounds. No murmur heard.    No friction rub. No gallop.  Pulmonary:     Effort: Pulmonary effort is normal.     Breath sounds: Normal breath sounds.  Abdominal:     General: Abdomen is flat. Bowel sounds  are normal.     Palpations: Abdomen is soft.  Musculoskeletal:        General: Normal range of motion.     Cervical back: Normal range of motion and neck supple.     Right lower leg: No edema.     Left lower leg: No edema.     Comments: Demonstrates 5 out of 5 strength in all extremities.  Skin:    General: Skin is warm and dry.     Capillary Refill: Capillary refill takes less than 2 seconds.  Neurological:     General: No focal deficit present.     Mental Status: He is alert and oriented to person, place, and time. Mental status is at baseline.     GCS: GCS eye subscore is 4. GCS verbal subscore is 5. GCS motor subscore is 6.     Cranial Nerves: No cranial nerve deficit, dysarthria or facial asymmetry.      Motor: Motor function is intact.     Coordination: Coordination is intact.  Psychiatric:        Mood and Affect: Mood normal.     (all labs ordered are listed, but only abnormal results are displayed) Labs Reviewed  CBC WITH DIFFERENTIAL/PLATELET - Abnormal; Notable for the following components:      Result Value   WBC 15.0 (*)    Neutro Abs 13.0 (*)    Abs Immature Granulocytes 0.14 (*)    All other components within normal limits  COMPREHENSIVE METABOLIC PANEL WITH GFR - Abnormal; Notable for the following components:   Glucose, Bld 158 (*)    All other components within normal limits  URINALYSIS, ROUTINE W REFLEX MICROSCOPIC - Abnormal; Notable for the following components:   Ketones, ur 20 (*)    All other components within normal limits  CBG MONITORING, ED    EKG: EKG Interpretation Date/Time:  Thursday March 09 2024 19:00:57 EDT Ventricular Rate:  67 PR Interval:  195 QRS Duration:  88 QT Interval:  411 QTC Calculation: 434 R Axis:   53  Text Interpretation: Sinus rhythm Nonspecific T wave abnormalities in the inferior leads Normal intervals No STEMI No previous EKG for comparison Confirmed by Gennaro Bouchard ([id number]) on 03/09/2024 7:04:51 PM  Radiology: CT HEAD WO CONTRAST Result Date: 03/09/2024 CLINICAL DATA:  Syncope/presyncope, cerebrovascular cause suspected EXAM: CT HEAD WITHOUT CONTRAST TECHNIQUE: Contiguous axial images were obtained from the base of the skull through the vertex without intravenous contrast. RADIATION DOSE REDUCTION: This exam was performed according to the departmental dose-optimization program which includes automated exposure control, adjustment of the mA and/or kV according to patient size and/or use of iterative reconstruction technique. COMPARISON:  Head CT 09/18/2019 FINDINGS: Brain: No intracranial hemorrhage, mass effect, or midline shift. Age related atrophy. No hydrocephalus. Stable chronic small vessel ischemia. No evidence of  territorial infarct or acute ischemia. No extra-axial or intracranial fluid collection. Vascular: Atherosclerosis of skullbase vasculature without hyperdense vessel or abnormal calcification. Skull: No fracture or focal lesion. Sinuses/Orbits: Chronic lobulated mucous retention cyst in the left frontal sinus. Small fluid level within the left maxillary sinus, chronic but increased. Other: None. IMPRESSION: 1. No acute intracranial abnormality. 2. Age related atrophy and chronic small vessel ischemia. 3. Small fluid level within the left maxillary sinus, chronic but increased. Electronically Signed   By: Andrea Gasman M.D.   On: 03/09/2024 21:31     Procedures   Medications Ordered in the ED  ondansetron  (ZOFRAN ) injection 4 mg (4 mg Intravenous  Given 03/09/24 1937)    Clinical Course as of 03/09/24 2300  Thu Mar 09, 2024  2218 Noted with no acute findings. [JG]    Clinical Course User Index [JG] Myriam Dorn BROCKS, PA                                 Medical Decision Making Amount and/or Complexity of Data Reviewed Labs: ordered. Radiology: ordered.  Risk Prescription drug management.   Medical Decision Making:   Carlos Baldwin is a 89 y.o. male who presented to the ED today with syncope and bradycardia detailed above.    Handoff received from EMS.  Complete initial physical exam performed, notably the patient  was alert and oriented apparent distress.  Physical exam as noted.    Reviewed and confirmed nursing documentation for past medical history, family history, social history.    Initial Assessment:   With the patient's presentation of syncope, most likely diagnosis is syncope, differential includes vasovagal syncope, orthostatic hypotension, cardiac arrhythmia, acute neurovascular insult..   Initial Plan:  Obtain CT of the head to rule out acute neurovascular insult Screening labs including CBC and Metabolic panel to evaluate for infectious or metabolic etiology of  disease.  Urinalysis with reflex culture ordered to evaluate for UTI or relevant urologic/nephrologic pathology.  CXR to evaluate for structural/infectious intrathoracic pathology.  EKG to evaluate for cardiac pathology Objective evaluation as below reviewed   Initial Study Results:   Laboratory  All laboratory results reviewed without evidence of clinically relevant pathology.   Exceptions include: Leukocytosis of 15, neutrophilia 13  EKG EKG was reviewed independently. Rate, rhythm, axis, intervals all examined and without medically relevant abnormality. ST segments without concerns for elevations.    Radiology:  All images reviewed independently. Agree with radiology report at this time.   CT HEAD WO CONTRAST Result Date: 03/09/2024 CLINICAL DATA:  Syncope/presyncope, cerebrovascular cause suspected EXAM: CT HEAD WITHOUT CONTRAST TECHNIQUE: Contiguous axial images were obtained from the base of the skull through the vertex without intravenous contrast. RADIATION DOSE REDUCTION: This exam was performed according to the departmental dose-optimization program which includes automated exposure control, adjustment of the mA and/or kV according to patient size and/or use of iterative reconstruction technique. COMPARISON:  Head CT 09/18/2019 FINDINGS: Brain: No intracranial hemorrhage, mass effect, or midline shift. Age related atrophy. No hydrocephalus. Stable chronic small vessel ischemia. No evidence of territorial infarct or acute ischemia. No extra-axial or intracranial fluid collection. Vascular: Atherosclerosis of skullbase vasculature without hyperdense vessel or abnormal calcification. Skull: No fracture or focal lesion. Sinuses/Orbits: Chronic lobulated mucous retention cyst in the left frontal sinus. Small fluid level within the left maxillary sinus, chronic but increased. Other: None. IMPRESSION: 1. No acute intracranial abnormality. 2. Age related atrophy and chronic small vessel ischemia.  3. Small fluid level within the left maxillary sinus, chronic but increased. Electronically Signed   By: Andrea Gasman M.D.   On: 03/09/2024 21:31    Reassessment and Plan:   Initial workup for syncope is unremarkable as CT scan of the head did not show any acute findings, CMP is unremarkable, blood glucose is within normal limits.  Noted that he had a leukocytosis of 15, obtain chest x-ray and UA to assess for infectious pathology without any acute findings at this time.  At time of handoff, this patient was stable, still notes that he has occasional dizziness, continuous cardiac monitoring does show sinus rhythm  that is fluctuating between the middle of 50s to as high as 80.  Consultation placed with cardiology for assessment of if this patient qualifies for outpatient therapy and follow-up or should be admitted for further workup and evaluation.  Handed care to R. Browning, PA-C.       Final diagnoses:  None    ED Discharge Orders     None          Myriam Dorn BROCKS, [place] 03/09/24 2353    Gennaro Bouchard L, DO 03/14/24 1034

## 2024-03-10 DIAGNOSIS — R55 Syncope and collapse: Secondary | ICD-10-CM | POA: Diagnosis present

## 2024-03-10 DIAGNOSIS — I951 Orthostatic hypotension: Secondary | ICD-10-CM | POA: Diagnosis not present

## 2024-03-10 LAB — BASIC METABOLIC PANEL WITH GFR
Anion gap: 9 (ref 5–15)
BUN: 22 mg/dL (ref 8–23)
CO2: 25 mmol/L (ref 22–32)
Calcium: 8.8 mg/dL — ABNORMAL LOW (ref 8.9–10.3)
Chloride: 106 mmol/L (ref 98–111)
Creatinine, Ser: 1.01 mg/dL (ref 0.61–1.24)
GFR, Estimated: >60 mL/min (ref >60)
Glucose, Bld: 130 mg/dL — ABNORMAL HIGH (ref 70–99)
Potassium: 4.2 mmol/L (ref 3.5–5.1)
Sodium: 140 mmol/L (ref 135–145)

## 2024-03-10 LAB — CBC
HCT: 38.1 % — ABNORMAL LOW (ref 39.0–52.0)
Hemoglobin: 12.7 g/dL — ABNORMAL LOW (ref 13.0–17.0)
MCH: 30.4 pg (ref 26.0–34.0)
MCHC: 33.3 g/dL (ref 30.0–36.0)
MCV: 91.1 fL (ref 80.0–100.0)
Platelets: 244 K/uL (ref 150–400)
RBC: 4.18 MIL/uL — ABNORMAL LOW (ref 4.22–5.81)
RDW: 14 % (ref 11.5–15.5)
WBC: 10.1 K/uL (ref 4.0–10.5)
nRBC: 0 % (ref 0.0–0.2)

## 2024-03-10 LAB — TSH: TSH: 0.641 u[IU]/mL (ref 0.350–4.500)

## 2024-03-10 LAB — TROPONIN I (HIGH SENSITIVITY)
Troponin I (High Sensitivity): 10 ng/L (ref <18)
Troponin I (High Sensitivity): 9 ng/L (ref <18)

## 2024-03-10 LAB — PHOSPHORUS: Phosphorus: 3.8 mg/dL (ref 2.5–4.6)

## 2024-03-10 LAB — GLUCOSE, CAPILLARY: Glucose-Capillary: 118 mg/dL — ABNORMAL HIGH (ref 70–99)

## 2024-03-10 LAB — MAGNESIUM: Magnesium: 1.9 mg/dL (ref 1.7–2.4)

## 2024-03-10 MED ORDER — ACETAMINOPHEN 500 MG PO TABS: 1000.0000 mg | ORAL_TABLET | Freq: Four times a day (QID) | ORAL | Status: DC | PRN

## 2024-03-10 MED ORDER — BUDESON-GLYCOPYRROL-FORMOTEROL 160-9-4.8 MCG/ACT IN AERO
2.0000 | INHALATION_SPRAY | Freq: Two times a day (BID) | RESPIRATORY_TRACT | Status: DC
  Filled 2024-03-10: qty 5.9

## 2024-03-10 MED ORDER — ALBUTEROL SULFATE (2.5 MG/3ML) 0.083% IN NEBU: 2.5000 mg | INHALATION_SOLUTION | RESPIRATORY_TRACT | Status: DC | PRN

## 2024-03-10 MED ORDER — POLYETHYLENE GLYCOL 3350 17 G PO PACK: 17.0000 g | PACK | Freq: Every day | ORAL | Status: DC | PRN

## 2024-03-10 MED ORDER — MELATONIN 3 MG PO TABS: 6.0000 mg | ORAL_TABLET | Freq: Every evening | ORAL | Status: DC | PRN

## 2024-03-10 MED ORDER — ONDANSETRON HCL 4 MG/2ML IJ SOLN: 4.0000 mg | Freq: Four times a day (QID) | INTRAMUSCULAR | Status: DC | PRN

## 2024-03-10 MED ORDER — LOSARTAN POTASSIUM 50 MG PO TABS
50.0000 mg | ORAL_TABLET | Freq: Two times a day (BID) | ORAL | 0 refills | Status: AC
Start: 2024-03-10 — End: 2024-06-08

## 2024-03-10 MED ORDER — FINASTERIDE 5 MG PO TABS
5.0000 mg | ORAL_TABLET | Freq: Every day | ORAL | Status: DC
  Administered 2024-03-10: 5 mg via ORAL
  Filled 2024-03-10 (×2): qty 1

## 2024-03-10 MED ORDER — SODIUM CHLORIDE 0.9 % IV BOLUS
1000.0000 mL | Freq: Once | INTRAVENOUS | Status: AC
  Administered 2024-03-10: 1000 mL via INTRAVENOUS

## 2024-03-10 MED ORDER — SODIUM CHLORIDE 0.9% FLUSH
3.0000 mL | Freq: Two times a day (BID) | INTRAVENOUS | Status: DC
  Administered 2024-03-10 (×2): 3 mL via INTRAVENOUS

## 2024-03-10 MED ORDER — ENOXAPARIN SODIUM 40 MG/0.4ML IJ SOSY
40.0000 mg | PREFILLED_SYRINGE | INTRAMUSCULAR | Status: DC
  Administered 2024-03-10: 40 mg via SUBCUTANEOUS
  Filled 2024-03-10: qty 0.4

## 2024-03-10 NOTE — H&P (Signed)
 History and Physical    Carlos Baldwin FMW:[id number] DOB: 19-Mar-1935 DOA: 03/09/2024  PCP: Lendia Boby CROME, NP-C   Patient coming from: Home   Chief Complaint:  Chief Complaint  Patient presents with   Loss of Consciousness    HPI:  Carlos Baldwin is a 89 y.o. male with hx of HTN, HLD, Pre-DM, aortic atherosclerosis, COPD, pulmonary nodule, who was brought in after syncopal episode.  Patient reports that this week he was doing workout in the yard and thinks he may have gotten dehydrated.  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  He also became nauseous and vomiting and had diarrhea at the time.  Denies any chest pain or palpitations.  No recent GI losses, eating and drinking well.  Takes losartan  for blood pressure.   Review of Systems:  ROS complete and negative except as marked above   Allergies  Allergen Reactions   Atenolol Other (See Comments)    not sure   Pravastatin     Joint pain   Simvastatin     Joint pain   Sulfa Antibiotics    Sulfonamide Derivatives Swelling and Rash    Prior to Admission medications   Medication Sig Start Date End Date Taking? Authorizing Provider  acetaminophen  (TYLENOL ) 650 MG CR tablet Take 650 mg by mouth daily.   Yes [provider]  Ascorbic Acid (VITAMIN C) 500 MG CAPS Take 500 mg by mouth daily.   Yes [provider]  finasteride  (PROSCAR ) 5 MG tablet TAKE 1 TABLET BY MOUTH DAILY FOR PROSTATE 10/11/23  Yes Cranford, Tonya, NP  losartan  (COZAAR ) 100 MG tablet TAKE 1 TABLET BY MOUTH DAILY FOR BLOOD PRESSURE 08/11/23  Yes Cranford, Tonya, NP  multivitamin-lutein (OCUVITE-LUTEIN) CAPS capsule Take 1 capsule by mouth daily.   Yes [provider]  Polyethyl Glycol-Propyl Glycol (SYSTANE OP) Place 1 drop into both eyes 3 (three) times daily as needed (Dry  eyes).    Yes [provider]  TRELEGY ELLIPTA  200-62.5-25 MCG/ACT AEPB INHALE 1 PUFF BY MOUTH DAILY 07/05/23  Yes Laurice President, NP    Past Medical History:  Diagnosis Date   Arthritis    oa   Asthma    as child   Bladder neck obstruction 08/02/2013   Colon polyps    Hyperlipidemia    Hypertension    Measles    as child   Mumps    as child   Positive colorectal cancer screening using Cologuard test 07/17/2019   Pre-diabetes     Past Surgical History:  Procedure Laterality Date   KNEE SURGERY Right 1972   cartlidge repair   left knee arthroscopy  01-14-2012   TOTAL KNEE ARTHROPLASTY Left 11/04/2015   Procedure: LEFT TOTAL KNEE ARTHROPLASTY;  Surgeon: Dempsey Moan, MD;  Location: [facility];  Service: Orthopedics;  Laterality: Left;   TOTAL KNEE ARTHROPLASTY Right 10/11/2017   Procedure: RIGHT TOTAL KNEE ARTHROPLASTY;  Surgeon: Moan Dempsey, MD;  Location: [facility];  Service: Orthopedics;  Laterality: Right;     reports that he has never smoked. He has never used smokeless tobacco. He reports that he does not drink alcohol and does not use drugs.  Family History  Problem Relation Age of Onset   Colon cancer Neg Hx    Esophageal cancer Neg Hx    Liver cancer Neg Hx  Stomach cancer Neg Hx      Physical Exam: Vitals:   03/10/24 0000 03/10/24 0030 03/10/24 0200 03/10/24 0313  BP: (!) 145/89 (!) 162/83 (!) 153/78 (!) 148/93  Pulse: 94 90 70 76  Resp: (!) 26 16 17 18   Temp:   98.1 F (36.7 C) 97.6 F (36.4 C)  TempSrc:   Oral Oral  SpO2: 93% 92% 95% 96%  Weight:      Height:        Gen: Awake, alert, NAD   CV: Regular, normal S1, S2, no murmurs  Resp: Normal WOB, CTAB  Abd: Flat, normoactive, nontender MSK: Symmetric, no edema  Skin: No rashes or lesions to exposed skin  Neuro: Alert and interactive  Psych: euthymic, appropriate    Data review:   Labs reviewed, notable for:   Chemistry unremarkable WBC 15 High-sensitivity troponin 10 UA  positive ketones  Micro:  Results for orders placed or performed in visit on 04/22/23  MICROSCOPIC MESSAGE     Status: None   Collection Time: 04/22/23 11:02 AM  Result Value Ref Range Status   Note   Final    Comment: This urine was analyzed for the presence of WBC,  RBC, bacteria, casts, and other formed elements.  Only those elements seen were reported. . .     Imaging reviewed:  DG Chest 1 View Result Date: 03/10/2024 CLINICAL DATA:  Syncope. EXAM: CHEST  1 VIEW COMPARISON:  October 25, 2019 FINDINGS: The cardiac silhouette is mildly enlarged. Low lung volumes are noted. Mild atelectatic changes are seen within the bilateral lung bases. There is mild blunting of the right costophrenic angle. No pneumothorax is identified. Multilevel degenerative changes are seen throughout the thoracic spine. IMPRESSION: 1. Low lung volumes with mild bibasilar atelectasis. 2. Small right pleural effusion. Electronically Signed   By: Suzen Dials M.D.   On: 03/10/2024 00:05   CT HEAD WO CONTRAST Result Date: 03/09/2024 CLINICAL DATA:  Syncope/presyncope, cerebrovascular cause suspected EXAM: CT HEAD WITHOUT CONTRAST TECHNIQUE: Contiguous axial images were obtained from the base of the skull through the vertex without intravenous contrast. RADIATION DOSE REDUCTION: This exam was performed according to the departmental dose-optimization program which includes automated exposure control, adjustment of the mA and/or kV according to patient size and/or use of iterative reconstruction technique. COMPARISON:  Head CT 09/18/2019 FINDINGS: Brain: No intracranial hemorrhage, mass effect, or midline shift. Age related atrophy. No hydrocephalus. Stable chronic small vessel ischemia. No evidence of territorial infarct or acute ischemia. No extra-axial or intracranial fluid collection. Vascular: Atherosclerosis of skullbase vasculature without hyperdense vessel or abnormal calcification. Skull: No fracture or focal  lesion. Sinuses/Orbits: Chronic lobulated mucous retention cyst in the left frontal sinus. Small fluid level within the left maxillary sinus, chronic but increased. Other: None. IMPRESSION: 1. No acute intracranial abnormality. 2. Age related atrophy and chronic small vessel ischemia. 3. Small fluid level within the left maxillary sinus, chronic but increased. Electronically Signed   By: Andrea Gasman M.D.   On: 03/09/2024 21:31    EKG:  Personally reviewed, sinus bradycardia 55, borderline first-degree AV block with PR near 200, no acute ischemic changes  ED Course:  Noted intermittent bradycardia into the 40s, without significant heart block (baseline borderline 1st degree).    Assessment/Plan:  89 y.o. male with hx HTN, HLD, Pre-DM, aortic atherosclerosis, COPD, pulmonary nodule, who was brought in after syncopal episode, suspected mixed orthostatic and vagal episode.   Syncope, suspect mixed orthostatic and vagal Clinically dehydrated  after working out in the yard and heat this week. Symptoms of orthostasis with lightheadedness with standing. + N/V/D at time of episode suggesting vagal response. Intermittent bradycardia although EKG captured episode of bradycardia which is sinus bradycardia with borderline first-degree AV block, without other concerning features. Lab notable UA with ketone suggesting decreased PO intake. WBC 15 although no localizing symptom of infection, suspect hemoconcentration / reactive.  - Give 1 L IV fluid, then continue oral hydration.  Check orthostatics in the morning - Hold home losartan  pending orthostatics; consider modest dose reduction at discharge  - Telemonitoring - Do not feel requires cardiology consult or echo.    Chronic medical problems: Hypertension: has supine hypertension, will eval orthostatics. Hold home losartan  per above Hyperlipidemia: Not on cholesterol-lowering agents. Prediabetes: Diet controlled, not currently requiring SSI. Aortic  atherosclerosis: Noted COPD: Continue home Trelegy Ellipta  equivalent, albuterol  as needed Pulmonary nodule: Declined additional CT screening as outpatient.  Body mass index is 25.84 kg/m.    DVT prophylaxis:  Lovenox  Code Status:  Full Code Diet:  Diet Orders (From admission, onward)     Start     Ordered   03/10/24 0117  Diet regular Room service appropriate? Yes; Fluid consistency: Thin  Diet effective now       Question Answer Comment  Room service appropriate? Yes   Fluid consistency: Thin      03/10/24 0116           Family Communication:  None   Consults:  None   Admission status:   Observation, Telemetry bed  Severity of Illness: The appropriate patient status for this patient is OBSERVATION. Observation status is judged to be reasonable and necessary in order to provide the required intensity of service to ensure the patient's safety. The patient's presenting symptoms, physical exam findings, and initial radiographic and laboratory data in the context of their medical condition is felt to place them at decreased risk for further clinical deterioration. Furthermore, it is anticipated that the patient will be medically stable for discharge from the hospital within 2 midnights of admission.    Dorn Dawson, MD [facility]  How to contact the [facility] Attending or Consulting provider 7A - 7P or covering provider during after hours 7P -7A, for this patient.  Check the care team in [facility] and look for a) attending/consulting [facility] provider listed and b) the [facility] team listed Log into [email or web address] and use [facility]'s universal password to access. If you do not have the password, please contact the hospital operator. Locate the [facility] provider you are looking for under [facility] and page to a number that you can be directly reached. If you still have difficulty reaching the provider, please page the [facility] (Director on Call) for the Hospitalists listed on amion for  assistance.  03/10/2024, 4:44 AM

## 2024-03-10 NOTE — Care Management Obs Status (Signed)
 MEDICARE OBSERVATION STATUS NOTIFICATION   Patient Details  Name: DAMIN SALIDO MRN: [id number] Date of Birth: 1934-10-17   Medicare Observation Status Notification Given:  Yes    Andrez JULIANNA George, RN 03/10/2024, 9:03 AM

## 2024-03-10 NOTE — Discharge Summary (Signed)
 Physician Discharge Summary  Carlos Baldwin FMW:[id number] DOB: [birth year] DOA: 03/09/2024  PCP: Lendia Boby CROME, NP-C  Admit date: 03/09/2024 Discharge date: 03/10/2024  Admitted From: Home Disposition: Home  Recommendations for Outpatient Follow-up:  Follow up with PCP in 1-2 weeks Losartan  decreased to 50 mg p.o. daily Patient instructed to maintain blood pressure log to bring to next PCP visit Instructed patient to maintain adequate hydration as likely syncopal versus vasovagal episode related to orthostatic hypotension from dehydration  Home Health: No Equipment/Devices: None  Discharge Condition: Stable CODE STATUS: Full code Diet recommendation: Heart healthy diet  History of present illness:  Carlos Baldwin is a 89 y.o. male with hx of HTN, HLD, Pre-DM, aortic atherosclerosis, COPD, pulmonary nodule, who was brought in after syncopal episode.  Patient reports that this week he was doing workout in the yard and thinks he may have gotten dehydrated.  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  He also became nauseous and vomiting and had diarrhea at the time.  Denies any chest pain or palpitations.  No recent GI losses, eating and drinking well.  Takes losartan  for blood pressure.   Hospital course:  Syncope, suspect mixed orthostatic and vagal Clinically dehydrated after working out in the yard and heat this week. Symptoms of orthostasis with lightheadedness with standing. + N/V/D at time of episode suggesting vagal response. Intermittent bradycardia although EKG captured episode of bradycardia which is sinus bradycardia with borderline first-degree AV block, without other concerning features. Lab notable UA with ketone suggesting decreased PO intake. WBC 15 although no localizing symptom of infection, suspect hemoconcentration /  reactive.  Patient was supported with IV fluid hydration.   Home losartan  was reduced to 50 mg p.o. daily.  Patient's symptoms have resolved following IV fluid hydration.  Instructed maintain adequate hydration.  Outpatient follow-up PCP.   Hypertension: Losartan  reduced to 50 mg p.o. daily.  Maintain BP log outpatient bring to next PCP visit for review.  Hyperlipidemia: Not on cholesterol-lowering agents.  Prediabetes: Diet controlled  Aortic atherosclerosis: Noted  COPD: Continue home Trelegy Ellipta  equivalent, albuterol  as needed  Pulmonary nodule: Declined additional CT screening as outpatient.  Discharge Diagnoses:  Principal Problem:   Syncope Active Problems:   Orthostasis   Vagal reaction    Discharge Instructions  Discharge Instructions     Call MD for:  difficulty breathing, headache or visual disturbances   Complete by: As directed    Call MD for:  extreme fatigue   Complete by: As directed    Call MD for:  persistant dizziness or light-headedness   Complete by: As directed    Call MD for:  persistant nausea and vomiting   Complete by: As directed    Call MD for:  severe uncontrolled pain   Complete by: As directed    Call MD for:  temperature >100.4   Complete by: As directed    Diet - low sodium heart healthy   Complete by: As directed    Increase activity slowly   Complete by: As directed       Allergies as of 03/10/2024       Reactions   Atenolol Other (See Comments)   not sure   Pravastatin    Joint pain   Simvastatin    Joint pain   Sulfa Antibiotics    Sulfonamide Derivatives Swelling,  Rash        Medication List     TAKE these medications    acetaminophen  650 MG CR tablet Commonly known as: TYLENOL  Take 650 mg by mouth daily.   finasteride  5 MG tablet Commonly known as: PROSCAR  TAKE 1 TABLET BY MOUTH DAILY FOR PROSTATE   losartan  50 MG tablet Commonly known as: Cozaar  Take 1 tablet (50 mg total) by mouth 2 (two) times  daily. What changed:  medication strength how much to take when to take this additional instructions   multivitamin-lutein Caps capsule Take 1 capsule by mouth daily.   SYSTANE OP Place 1 drop into both eyes 3 (three) times daily as needed (Dry eyes).   Trelegy Ellipta  200-62.5-25 MCG/ACT Aepb Generic drug: Fluticasone -Umeclidin-Vilant INHALE 1 PUFF BY MOUTH DAILY   Vitamin C 500 MG Caps Take 500 mg by mouth daily.        Follow-up Information     Henson, Vickie L, NP-C. Schedule an appointment as soon as possible for a visit in 1 week(s).   Specialty: Family Medicine Contact information: [address] [place] [address] [phone]                Allergies  Allergen Reactions   Atenolol Other (See Comments)    not sure   Pravastatin     Joint pain   Simvastatin     Joint pain   Sulfa Antibiotics    Sulfonamide Derivatives Swelling and Rash    Consultations: None   Procedures/Studies: DG Chest 1 View Result Date: 03/10/2024 CLINICAL DATA:  Syncope. EXAM: CHEST  1 VIEW COMPARISON:  October 25, 2019 FINDINGS: The cardiac silhouette is mildly enlarged. Low lung volumes are noted. Mild atelectatic changes are seen within the bilateral lung bases. There is mild blunting of the right costophrenic angle. No pneumothorax is identified. Multilevel degenerative changes are seen throughout the thoracic spine. IMPRESSION: 1. Low lung volumes with mild bibasilar atelectasis. 2. Small right pleural effusion. Electronically Signed   By: Suzen Dials M.D.   On: 03/10/2024 00:05   CT HEAD WO CONTRAST Result Date: 03/09/2024 CLINICAL DATA:  Syncope/presyncope, cerebrovascular cause suspected EXAM: CT HEAD WITHOUT CONTRAST TECHNIQUE: Contiguous axial images were obtained from the base of the skull through the vertex without intravenous contrast. RADIATION DOSE REDUCTION: This exam was performed according to the departmental dose-optimization program which  includes automated exposure control, adjustment of the mA and/or kV according to patient size and/or use of iterative reconstruction technique. COMPARISON:  Head CT 09/18/2019 FINDINGS: Brain: No intracranial hemorrhage, mass effect, or midline shift. Age related atrophy. No hydrocephalus. Stable chronic small vessel ischemia. No evidence of territorial infarct or acute ischemia. No extra-axial or intracranial fluid collection. Vascular: Atherosclerosis of skullbase vasculature without hyperdense vessel or abnormal calcification. Skull: No fracture or focal lesion. Sinuses/Orbits: Chronic lobulated mucous retention cyst in the left frontal sinus. Small fluid level within the left maxillary sinus, chronic but increased. Other: None. IMPRESSION: 1. No acute intracranial abnormality. 2. Age related atrophy and chronic small vessel ischemia. 3. Small fluid level within the left maxillary sinus, chronic but increased. Electronically Signed   By: Andrea Gasman M.D.   On: 03/09/2024 21:31     Subjective: Patient seen examined bedside, lying in bed.  Reports dizziness has resolved.  Has been able to ambulate in the room without any further issues.  Discussed with patient to maintain adequate hydration given humidity and high temperatures currently.  Also will reduce losartan  to  50 mg p.o. daily, new prescription sent.  No other [place] questions or concerns at this time.  Denies headache, no dizziness, no visual disturbances, no chest pain, no palpitations, no shortness of breath, no abdominal pain, no fever/chills/night sweats, no nausea/vomiting/diarrhea, no focal weakness, no fatigue, no paresthesias.  No acute events overnight per nursing staff.  Discharge Exam: Vitals:   03/10/24 0955 03/10/24 0956  BP: (!) 141/79 136/80  Pulse: 86 90  Resp: 18 19  Temp: (!) 97.4 F (36.3 C) 97.6 F (36.4 C)  SpO2:     Vitals:   03/10/24 0727 03/10/24 0954 03/10/24 0955 03/10/24 0956  BP: (!) 149/73 132/74 (!)  141/79 136/80  Pulse: 69 71 86 90  Resp: 18 18 18 19   Temp: (!) 97.3 F (36.3 C) (!) 97.4 F (36.3 C) (!) 97.4 F (36.3 C) 97.6 F (36.4 C)  TempSrc: Oral Oral Oral Oral  SpO2: 96%     Weight:      Height:        Physical Exam: GEN: NAD, alert and oriented x 3, wd/wn HEENT: NCAT, PERRL, EOMI, sclera clear, MMM PULM: CTAB w/o wheezes/crackles, normal respiratory effort, on room air CV: RRR w/o M/G/R GI: abd soft, NTND, NABS, no R/G/M MSK: no peripheral edema, muscle strength globally intact 5/5 bilateral upper/lower extremities NEURO: CN II-XII intact, no focal deficits, sensation to light touch intact PSYCH: normal mood/affect Integumentary: dry/intact, no rashes or wounds    The results of significant diagnostics from this hospitalization (including imaging, microbiology, ancillary and laboratory) are listed below for reference.     Microbiology: No results found for this or any previous visit (from the past 240 hours).   Labs: BNP (last 3 results) No results for input(s): BNP in the last 8760 hours. Basic Metabolic Panel: Recent Labs  Lab 03/09/24 1930 03/10/24 0322  NA 142 140  K 4.1 4.2  CL 106 106  CO2 24 25  GLUCOSE 158* 130*  BUN 22 22  CREATININE 1.17 1.01  CALCIUM 9.4 8.8*  MG  --  1.9  PHOS  --  3.8   Liver Function Tests: Recent Labs  Lab 03/09/24 1930  AST 18  ALT 17  ALKPHOS 61  BILITOT 0.5  PROT 6.5  ALBUMIN 3.8   No results for input(s): LIPASE, AMYLASE in the last 168 hours. No results for input(s): AMMONIA in the last 168 hours. CBC: Recent Labs  Lab 03/09/24 1930 03/10/24 0322  WBC 15.0* 10.1  NEUTROABS 13.0*  --   HGB 13.5 12.7*  HCT 41.2 38.1*  MCV 92.6 91.1  PLT 250 244   Cardiac Enzymes: No results for input(s): CKTOTAL, CKMB, CKMBINDEX, TROPONINI in the last 168 hours. BNP: Invalid input(s): POCBNP CBG: Recent Labs  Lab 03/09/24 2324  GLUCAP 156*   D-Dimer No results for input(s): DDIMER  in the last 72 hours. Hgb A1c No results for input(s): HGBA1C in the last 72 hours. Lipid Profile No results for input(s): CHOL, HDL, LDLCALC, TRIG, CHOLHDL, LDLDIRECT in the last 72 hours. Thyroid  function studies Recent Labs    03/10/24 0322  TSH 0.641   Anemia work up No results for input(s): VITAMINB12, FOLATE, FERRITIN, TIBC, IRON, RETICCTPCT in the last 72 hours. Urinalysis    Component Value Date/Time   COLORURINE YELLOW 03/09/2024 2240   APPEARANCEUR CLEAR 03/09/2024 2240   LABSPEC 1.016 03/09/2024 2240   PHURINE 6.0 03/09/2024 2240   GLUCOSEU NEGATIVE 03/09/2024 2240   HGBUR NEGATIVE 03/09/2024 2240  BILIRUBINUR NEGATIVE 03/09/2024 2240   KETONESUR 20 (A) 03/09/2024 2240   PROTEINUR NEGATIVE 03/09/2024 2240   NITRITE NEGATIVE 03/09/2024 2240   LEUKOCYTESUR NEGATIVE 03/09/2024 2240   Sepsis Labs Recent Labs  Lab 03/09/24 1930 03/10/24 0322  WBC 15.0* 10.1   Microbiology No results found for this or any previous visit (from the past 240 hours).   Time coordinating discharge: Over 30 minutes  SIGNED:   Camellia PARAS [place], DO  [facility] 03/10/2024, 10:01 AM

## 2024-03-10 NOTE — TOC Transition Note (Signed)
 Transition of Care [facility]) - Discharge Note   Patient Details  Name: Carlos Baldwin MRN: [id number] Date of Birth: Jan 12, 1935  Transition of Care [facility]) CM/SW Contact:  Andrez JULIANNA George, RN Phone Number: 03/10/2024, 9:37 AM   Clinical Narrative:     D/c home with self care.  From  home with spouse. Pt still works 5 days a week on 2nd shift.  Doesn't use any DME at home. Manages his own medications and denies any issues.  No transportation needs.  Wife will transport home.  Final next level of care: Home/Self Care Barriers to Discharge: No Barriers Identified   Patient Goals and CMS Choice            Discharge Placement                       Discharge Plan and Services Additional resources added to the After Visit Summary for                                       Social Drivers of Health (SDOH) Interventions SDOH Screenings   Food Insecurity: No Food Insecurity (03/10/2024)  Housing: Low Risk  (03/10/2024)  Transportation Needs: No Transportation Needs (03/10/2024)  Utilities: Not At Risk (03/10/2024)  Alcohol Screen: Low Risk  (02/17/2024)  Depression (PHQ2-9): Low Risk  (02/17/2024)  Financial Resource Strain: Low Risk  (02/17/2024)  Physical Activity: Insufficiently Active (02/17/2024)  Social Connections: Socially Integrated (03/10/2024)  Stress: No Stress Concern Present (02/17/2024)  Tobacco Use: Low Risk  (03/09/2024)  Health Literacy: Adequate Health Literacy (02/17/2024)     Readmission Risk Interventions     No data to display

## 2024-03-10 NOTE — Evaluation (Signed)
 Physical Therapy Brief Evaluation and Discharge Note Patient Details Name: Carlos Baldwin MRN: [id number] DOB: 1935/05/11 Today's Date: 03/10/2024   History of Present Illness  89 y/o male admitted 03/09/24 with syncope after out working in yard then at work and feeling light headed and + syncope with N/V/D.  PMH positive for HTN, HLD, pre-DM, COPD, pulmonary nodule, aortic atherosclerosis.  Clinical Impression  Patient presents with mobility close to functional baseline.  Still mildly unsteady after in bed 24 hours though ambulatory without device and no LOB.  Encouraged slow return to activity, sitting if feeling light headed and to push fluids.  Orthostatics already taken per nursing, as noted below.  PT will sign off, stable for home with spouse support. Orthostatic VS for the past 24 hrs (Last 3 readings):  BP- Lying Pulse- Lying BP- Sitting Pulse- Sitting BP- Standing at 0 minutes Pulse- Standing at 0 minutes  03/10/24 0956 -- -- -- -- 136/80 90  03/10/24 0955 -- -- 141/79 86 -- --  03/10/24 0954 132/74 71 -- -- -- --          PT Assessment Patient does not need any further PT services  Assistance Needed at Discharge  PRN    Equipment Recommendations None recommended by PT  Recommendations for Other Services       Precautions/Restrictions Precautions Precautions: Fall Recall of Precautions/Restrictions: Intact Required Braces or Orthoses: Other Brace Other Brace: bilateral knee braces from home Restrictions Weight Bearing Restrictions Per Provider Order: No        Mobility  Bed Mobility       General bed mobility comments: up on EOB  Transfers Overall transfer level: Modified independent Equipment used: None               General transfer comment: pushing with hands from side of bed to stand; pulling up and fastening pants    Ambulation/Gait Ambulation/Gait assistance: Modified independent (Device/Increase time) Gait Distance (Feet): 200  Feet Assistive device: None Gait Pattern/deviations: Step-through pattern, Decreased stride length, Wide base of support Gait Speed: Pace WFL General Gait Details: mild instability though no LOB and no UE support, walking in slipper socks, denies pain, but says feet tender  Home Activity Instructions    Stairs            Modified Rankin (Stroke Patients Only)        Balance Overall balance assessment: Modified Independent                        Pertinent Vitals/Pain   Pain Assessment Pain Assessment: No/denies pain     Home Living Family/patient expects to be discharged to:: Private residence Living Arrangements: Spouse/significant other Available Help at Discharge: Family Home Environment: Level entry   Home Equipment: Agricultural consultant (2 wheels);Grab bars - tub/shower;BSC/3in1;Cane - single point        Prior Function Level of Independence: Independent Comments: working in [employer] manager, mows with riding mower    UE/LE Assessment   UE ROM/Strength/Tone/Coordination: [employer]    LE ROM/Strength/Tone/Coordination: [employer]      Communication   Communication Communication: No apparent difficulties     Cognition Overall Cognitive Status: Appears within functional limits for tasks assessed/performed       General Comments General comments (skin integrity, edema, etc.): Educated on gradual return to activity, monitor symptoms with sit to stand and sit for a bit prior to walking if light headed.  Discussed pushing fluids and not taking in much  caffeine.    Exercises     Assessment/Plan    PT Problem List         PT Visit Diagnosis Muscle weakness (generalized) (M62.81)    No Skilled PT All education completed;Patient is independent with all acitivity/mobility;Patient will have necessary level of assist by caregiver at discharge   Co-evaluation                AMPAC 6 Clicks Help needed turning from your back to your side while in a flat bed  without using bedrails?: None Help needed moving from lying on your back to sitting on the side of a flat bed without using bedrails?: None Help needed moving to and from a bed to a chair (including a wheelchair)?: None Help needed standing up from a chair using your arms (e.g., wheelchair or bedside chair)?: None Help needed to walk in hospital room?: None Help needed climbing 3-5 steps with a railing? : None 6 Click Score: 24      End of Session   Activity Tolerance: Patient tolerated treatment well Patient left: in bed;with call bell/phone within reach   PT Visit Diagnosis: Muscle weakness (generalized) (M62.81)     Time: 8771-8759 PT Time Calculation (min) (ACUTE ONLY): 12 min  Charges:   PT Evaluation $PT Eval Low Complexity: 1 Low      Micheline Portal, PT Acute Rehabilitation Services [phone] 03/10/2024   Montie Portal  03/10/2024, 1:27 PM

## 2024-03-10 NOTE — ED Notes (Signed)
 Transfer of care report called to Rhoderick RN.  Discussed pt Dx, Hx, current condition, lines, labs, imaging.  All questions asked were answered.

## 2024-03-10 NOTE — Plan of Care (Signed)

## 2024-03-10 NOTE — ED Provider Notes (Signed)
 Patient signed out to me at shift change.  Here after syncopal event earlier today in the setting of several episodes of bradycardia to the 40s.  Still feels lightheaded when he stands up.  Will admit for obs.  Appreciate Dr. Segars, for admitting.   Vicky Charleston, PA-C 03/10/24 0028    Albertina Dixon, MD 03/10/24 2351

## 2024-03-13 ENCOUNTER — Telehealth: Payer: Self-pay

## 2024-03-13 NOTE — Transitions of Care (Post Inpatient/ED Visit) (Signed)
   03/13/2024  Name: Carlos Baldwin MRN: [id number] DOB: 23-Jan-1935  Today's TOC FU Call Status: Today's TOC FU Call Status:: Unsuccessful Call (1st Attempt) Unsuccessful Call (1st Attempt) Date: 03/13/24  Attempted to reach the patient regarding the most recent Inpatient/ED visit.  Follow Up Plan: Additional outreach attempts will be made to reach the patient to complete the Transitions of Care (Post Inpatient/ED visit) call.   Signature Julian Lemmings, LPN [facility] Nurse Health Advisor Direct Dial [phone]

## 2024-03-13 NOTE — Transitions of Care (Post Inpatient/ED Visit) (Signed)
   03/13/2024  Name: Carlos Baldwin MRN: [id number] DOB: [birth year]  Today's TOC FU Call Status: Today's TOC FU Call Status:: Successful TOC FU Call Completed Unsuccessful Call (1st Attempt) Date: 03/13/24 [facility] FU Call Complete Date: 03/13/24 Patient's Name and Date of Birth confirmed.  Transition Care Management Follow-up Telephone Call Discharge Facility: [facility]) Type of Discharge: Inpatient Admission Primary Inpatient Discharge Diagnosis:: syncopy How have you been since you were released from the hospital?: Better Any questions or concerns?: No  Items Reviewed: Did you receive and understand the discharge instructions provided?: Yes Medications obtained,verified, and reconciled?: Yes (Medications Reviewed) Any new allergies since your discharge?: No Dietary orders reviewed?: Yes Do you have support at home?: Yes People in Home [RPT]: spouse  Medications Reviewed Today: Medications Reviewed Today     Reviewed by Emmitt Pan, LPN (Licensed Practical Nurse) on 03/13/24 at 1212  Med List Status: <None>   Medication Order Taking? Sig Documenting Provider Last Dose Status Informant  acetaminophen  (TYLENOL ) 650 MG CR tablet [id number] Yes Take 650 mg by mouth daily. [provider]  Active Self  Ascorbic Acid (VITAMIN C) 500 MG CAPS [id number] Yes Take 500 mg by mouth daily. [provider]  Active Self, Pharmacy Records  finasteride  (PROSCAR ) 5 MG tablet [id number] Yes TAKE 1 TABLET BY MOUTH DAILY FOR PROSTATE Cranford, Tonya, NP  Active Self, Pharmacy Records  losartan  (COZAAR ) 50 MG tablet [id number] Yes Take 1 tablet (50 mg total) by mouth 2 (two) times daily. [place], Camellia PARAS, DO  Active   multivitamin-lutein [facility]) CAPS capsule [id number] Yes Take 1 capsule by mouth daily. [provider]  Active Self  Polyethyl Glycol-Propyl Glycol (SYSTANE OP) [id number] Yes Place 1 drop into both eyes 3 (three) times daily as needed (Dry eyes).   [provider]  Active Self, Pharmacy Records  TRELEGY ELLIPTA  200-62.5-25 MCG/ACT AEPB [id number] Yes INHALE 1 PUFF BY MOUTH DAILY Laurice President, NP  Active Self, Pharmacy Records            Home Care and Equipment/Supplies: Were Home Health Services Ordered?: NA Any new equipment or medical supplies ordered?: NA  Functional Questionnaire: Do you need assistance with bathing/showering or dressing?: No Do you need assistance with meal preparation?: No Do you need assistance with eating?: No Do you have difficulty maintaining continence: No Do you need assistance with getting out of bed/getting out of a chair/moving?: No Do you have difficulty managing or taking your medications?: No  Follow up appointments reviewed: PCP Follow-up appointment confirmed?: Yes Date of PCP follow-up appointment?: 03/23/24 Follow-up Provider: [facility] Follow-up appointment confirmed?: NA Do you need transportation to your follow-up appointment?: No Do you understand care options if your condition(s) worsen?: Yes-patient verbalized understanding    SIGNATURE Pan Emmitt, LPN [facility] Nurse Health Advisor Direct Dial [phone]

## 2024-03-23 ENCOUNTER — Inpatient Hospital Stay: Admitting: Family Medicine

## 2024-03-27 ENCOUNTER — Inpatient Hospital Stay: Admitting: Family Medicine

## 2024-04-18 ENCOUNTER — Ambulatory Visit: Admitting: Family Medicine

## 2024-05-03 ENCOUNTER — Encounter: Payer: Medicare Other | Admitting: Internal Medicine

## 2024-05-09 ENCOUNTER — Other Ambulatory Visit: Payer: Self-pay | Admitting: Family Medicine

## 2024-05-09 DIAGNOSIS — J4520 Mild intermittent asthma, uncomplicated: Secondary | ICD-10-CM

## 2024-05-09 NOTE — Telephone Encounter (Unsigned)
 Copied from CRM [id number]. Topic: Clinical - Medication Refill >> May 09, 2024 12:19 PM Jasmin G wrote: Medication: TRELEGY ELLIPTA  200-62.5-25 MCG/ACT AEPB  Has the patient contacted their pharmacy? Yes (Agent: If no, request that the patient contact the pharmacy for the refill. If patient does not wish to contact the pharmacy document the reason why and proceed with request.) (Agent: If yes, when and what did the pharmacy advise?)  This is the patient's preferred pharmacy:  [id number] - [place], [place] - [address] AT [facility] [address] [place] [address] Phone: [phone] Fax: [phone]  Is this the correct pharmacy for this prescription? Yes If no, delete pharmacy and type the correct one.   Has the prescription been filled recently? Yes  Is the patient out of the medication? No  Has the patient been seen for an appointment in the last year OR does the patient have an upcoming appointment? Yes  Can we respond through MyChart? No  Agent: Please be advised that Rx refills may take up to 3 business days. We ask that you follow-up with your pharmacy.

## 2024-05-10 MED ORDER — TRELEGY ELLIPTA 200-62.5-25 MCG/ACT IN AEPB
INHALATION_SPRAY | RESPIRATORY_TRACT | 3 refills | Status: AC
Start: 2024-05-10 — End: ?

## 2024-06-08 ENCOUNTER — Other Ambulatory Visit (HOSPITAL_COMMUNITY): Payer: Self-pay | Admitting: Radiology

## 2024-06-08 DIAGNOSIS — J449 Chronic obstructive pulmonary disease, unspecified: Secondary | ICD-10-CM

## 2024-06-14 ENCOUNTER — Ambulatory Visit (HOSPITAL_COMMUNITY)
Admission: RE | Admit: 2024-06-14 | Discharge: 2024-06-14 | Disposition: A | Discharge status: Home / Self Care | Source: Ambulatory Visit | Attending: Internal Medicine | Admitting: Internal Medicine

## 2024-06-14 DIAGNOSIS — J449 Chronic obstructive pulmonary disease, unspecified: Secondary | ICD-10-CM | POA: Insufficient documentation

## 2024-06-14 LAB — PULMONARY FUNCTION TEST
DL/VA % pred: 91 %
DL/VA: 3.54 ml/min/mmHg/L
DLCO unc % pred: 84 %
DLCO unc: 18.06 ml/min/mmHg
FEF 25-75 Post: 1.76 L/s
FEF 25-75 Pre: 1.78 L/s
FEF2575-%Change-Post: -1 %
FEF2575-%Pred-Post: 125 %
FEF2575-%Pred-Pre: 127 %
FEV1-%Change-Post: -1 %
FEV1-%Pred-Post: 102 %
FEV1-%Pred-Pre: 103 %
FEV1-Post: 2.3 L
FEV1-Pre: 2.33 L
FEV1FVC-%Change-Post: -1 %
FEV1FVC-%Pred-Pre: 105 %
FEV6-%Change-Post: -1 %
FEV6-%Pred-Post: 102 %
FEV6-%Pred-Pre: 104 %
FEV6-Post: 3.09 L
FEV6-Pre: 3.14 L
FEV6FVC-%Change-Post: 0 %
FEV6FVC-%Pred-Post: 108 %
FEV6FVC-%Pred-Pre: 108 %
FVC-%Change-Post: 0 %
FVC-%Pred-Post: 96 %
FVC-%Pred-Pre: 96 %
FVC-Post: 3.16 L
FVC-Pre: 3.15 L
Post FEV1/FVC ratio: 73 %
Post FEV6/FVC ratio: 100 %
Pre FEV1/FVC ratio: 74 %
Pre FEV6/FVC Ratio: 100 %
RV % pred: 90 %
RV: 2.37 L
TLC % pred: 88 %
TLC: 5.71 L

## 2024-06-14 MED ORDER — ALBUTEROL SULFATE (2.5 MG/3ML) 0.083% IN NEBU
2.5000 mg | INHALATION_SOLUTION | Freq: Once | RESPIRATORY_TRACT | Status: AC
  Administered 2024-06-14: 2.5 mg via RESPIRATORY_TRACT

## 2025-02-19 ENCOUNTER — Ambulatory Visit
